# Patient Record
Sex: Male | Born: 1985 | Race: White | Hispanic: No | Marital: Married | Smoking: Never smoker
Health system: Southern US, Community
[De-identification: ages and names within clinical notes are randomized; demographics above are authoritative.]

## PROBLEM LIST (undated history)

## (undated) DIAGNOSIS — K219 Gastro-esophageal reflux disease without esophagitis: Secondary | ICD-10-CM

## (undated) HISTORY — PX: OTHER SURGICAL HISTORY: SHX169

## (undated) HISTORY — PX: PILONIDAL CYST EXCISION: SHX744

---
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
## 2005-02-04 ENCOUNTER — Emergency Department (HOSPITAL_COMMUNITY): Admission: EM | Admit: 2005-02-04 | Discharge: 2005-02-04 | Payer: Self-pay | Admitting: Emergency Medicine

## 2005-02-08 IMAGING — CR DG CHEST 2V
2 series · 2 of 2 positions shown · non-contrast
Comparison: none

CLINICAL DATA: Fever; headache; diarrhea; abdominal pain
 CHEST (TWO VIEWS), [DATE], [DZ] HOURS
 The heart size and mediastinal contours are normal. The lungs are clear. The visualized skeleton is unremarkable.

 IMPRESSION
 No active disease.

[view not recorded (1 of 2)]
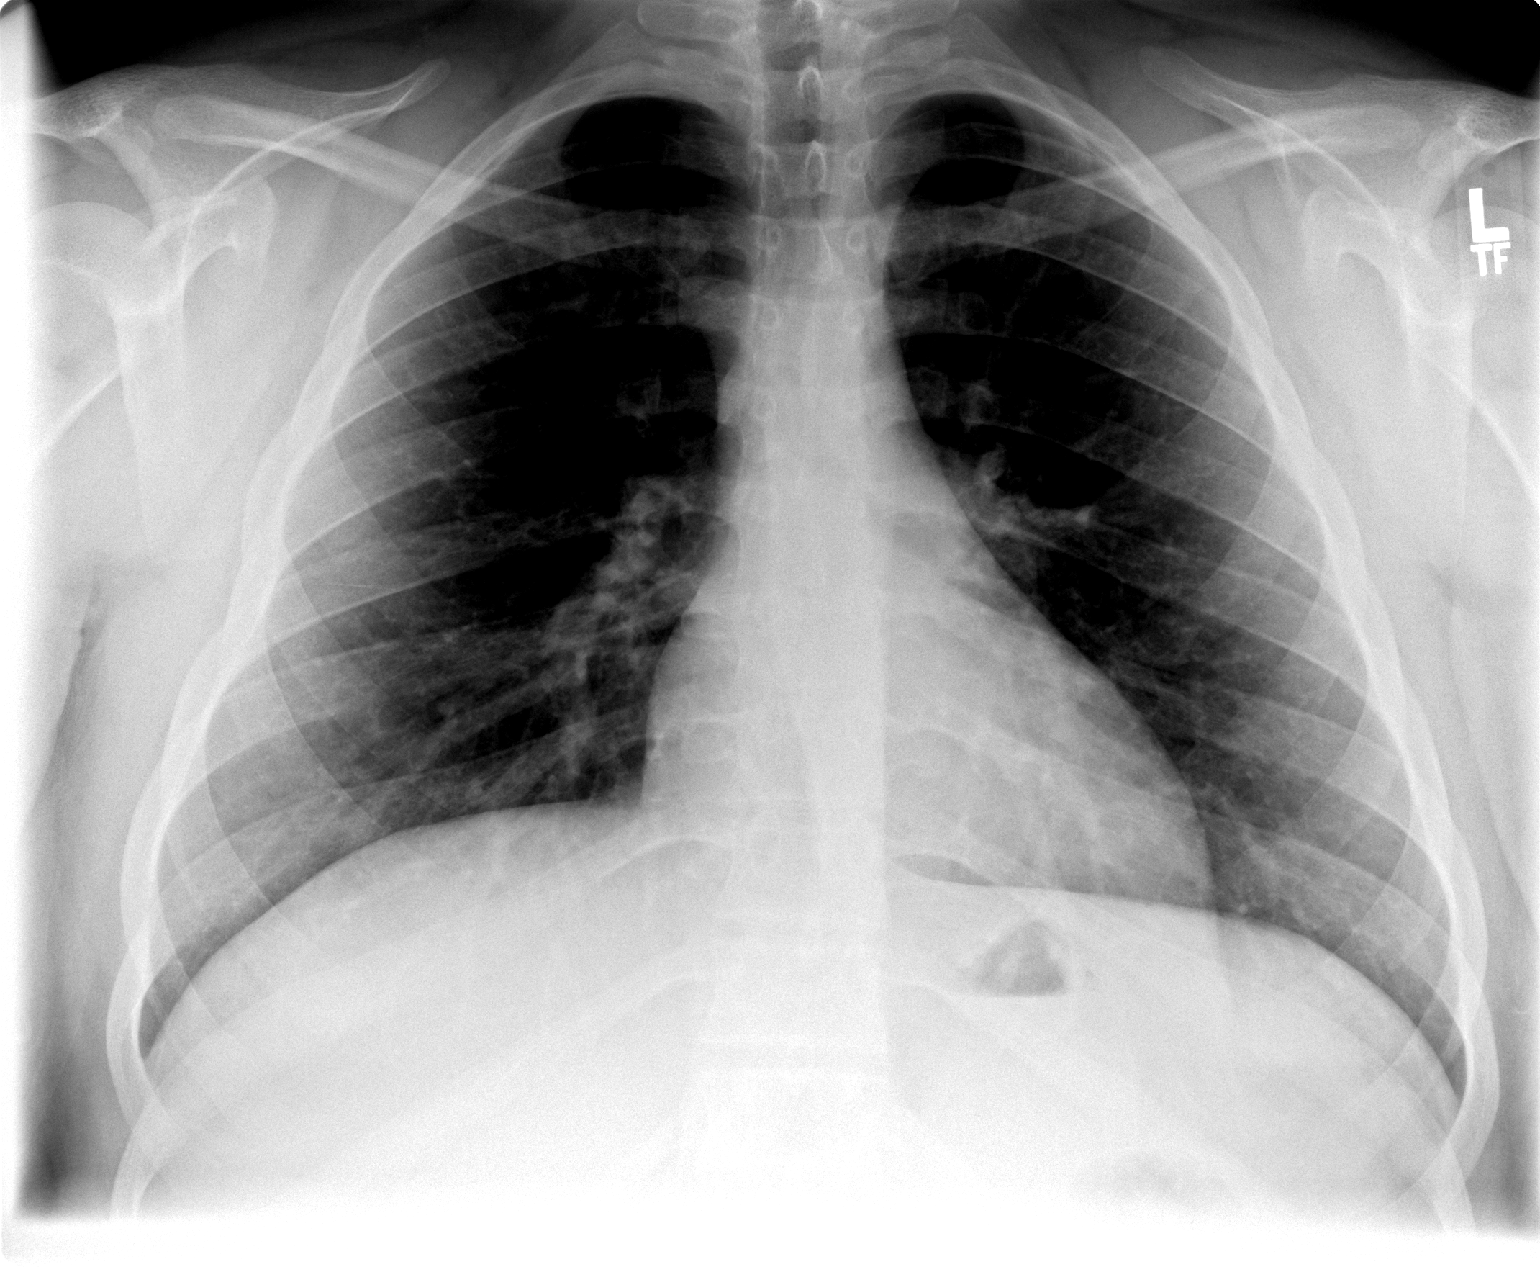

[view not recorded (2 of 2)]
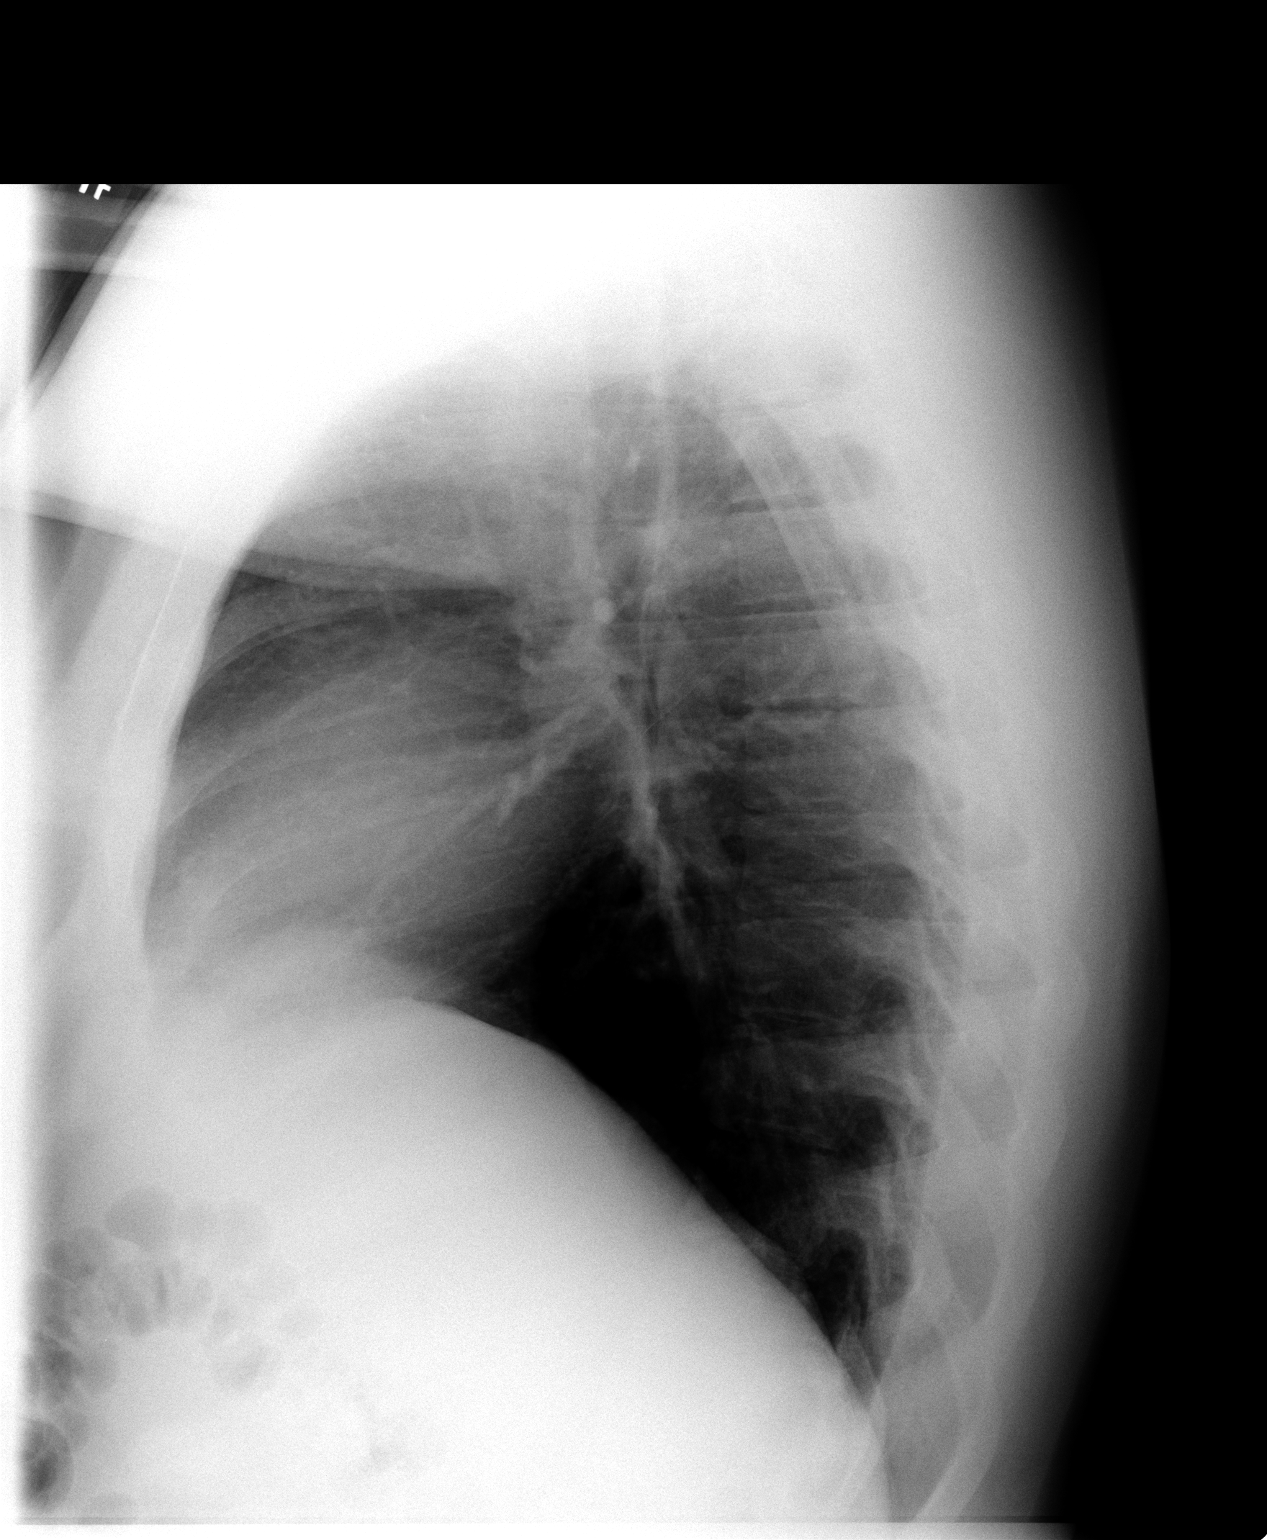

[2 of 2 positions shown; findings below may reference images not displayed]

## 2005-02-14 ENCOUNTER — Ambulatory Visit (HOSPITAL_COMMUNITY): Admission: RE | Admit: 2005-02-14 | Discharge: 2005-02-14 | Payer: Self-pay | Admitting: General Surgery

## 2005-03-01 ENCOUNTER — Emergency Department (HOSPITAL_COMMUNITY): Admission: EM | Admit: 2005-03-01 | Discharge: 2005-03-01 | Payer: Self-pay | Admitting: Emergency Medicine

## 2005-04-21 ENCOUNTER — Emergency Department (HOSPITAL_COMMUNITY): Admission: EM | Admit: 2005-04-21 | Discharge: 2005-04-22 | Payer: Self-pay | Admitting: Emergency Medicine

## 2005-04-21 ENCOUNTER — Emergency Department (HOSPITAL_COMMUNITY): Admission: EM | Admit: 2005-04-21 | Discharge: 2005-04-21 | Payer: Self-pay | Admitting: Emergency Medicine

## 2005-04-21 IMAGING — CR DG SHOULDER 2+V*R*
3 series · 3 of 3 positions shown · non-contrast
Comparison: none

CLINICAL DATA: Pain after slip and slide injury

Right shoulder three-view:
There is a curvilinear osseous density projecting at the inferior margin of the
glenoid which may represent a glenoid rim fracture. No dislocation. No
additional bone abnormality is evident.

[view not recorded (1 of 3)]
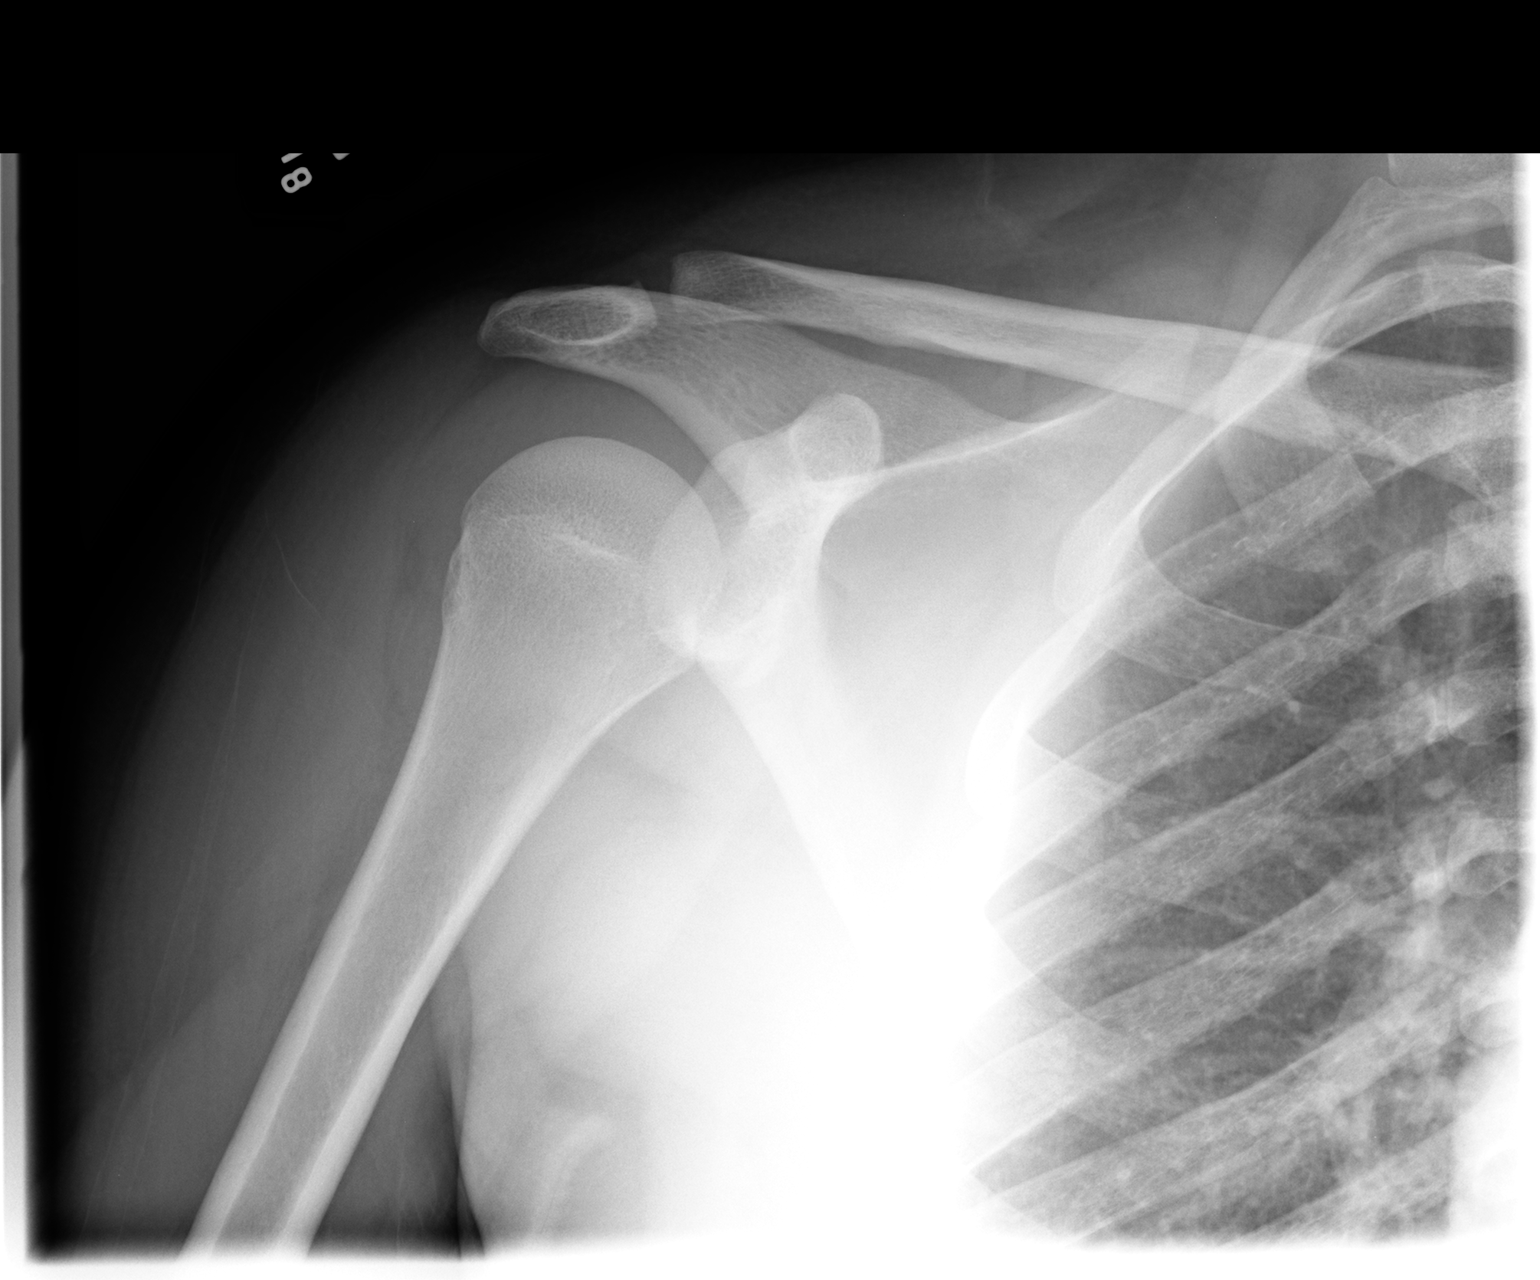

[view not recorded (2 of 3)]
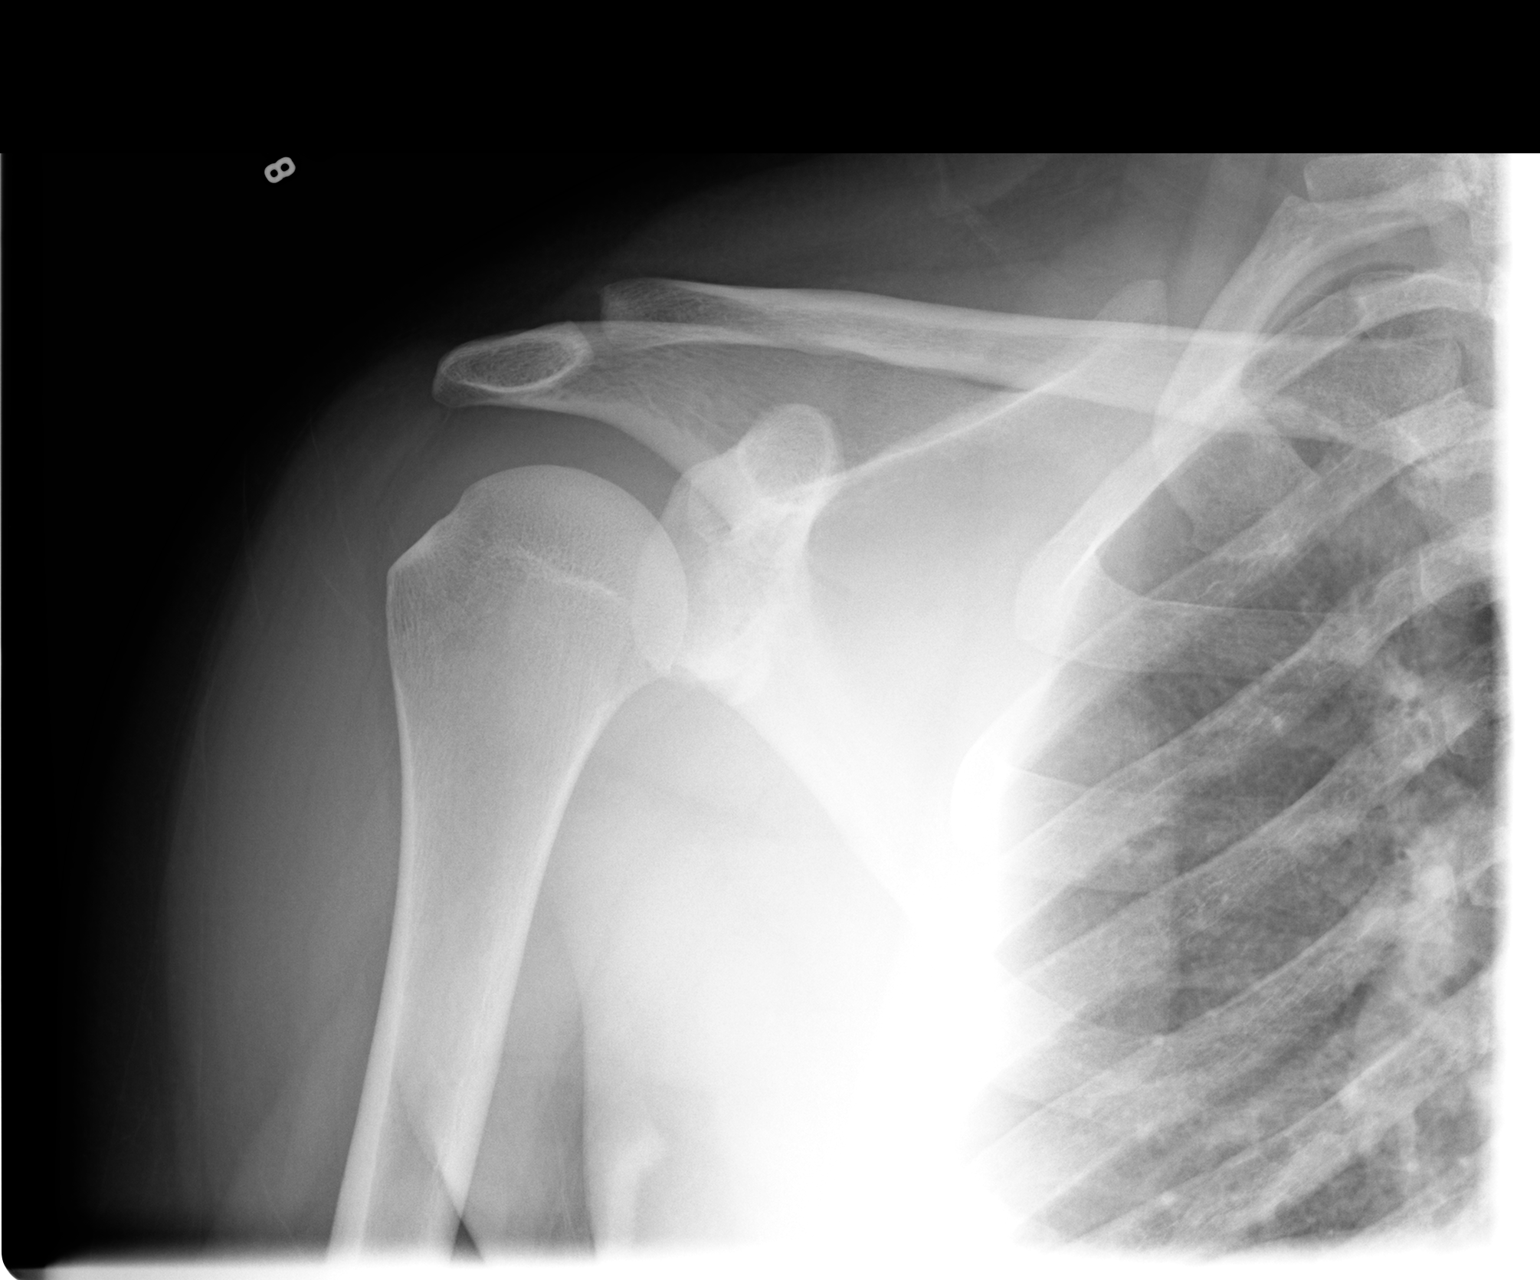

[view not recorded (3 of 3)]
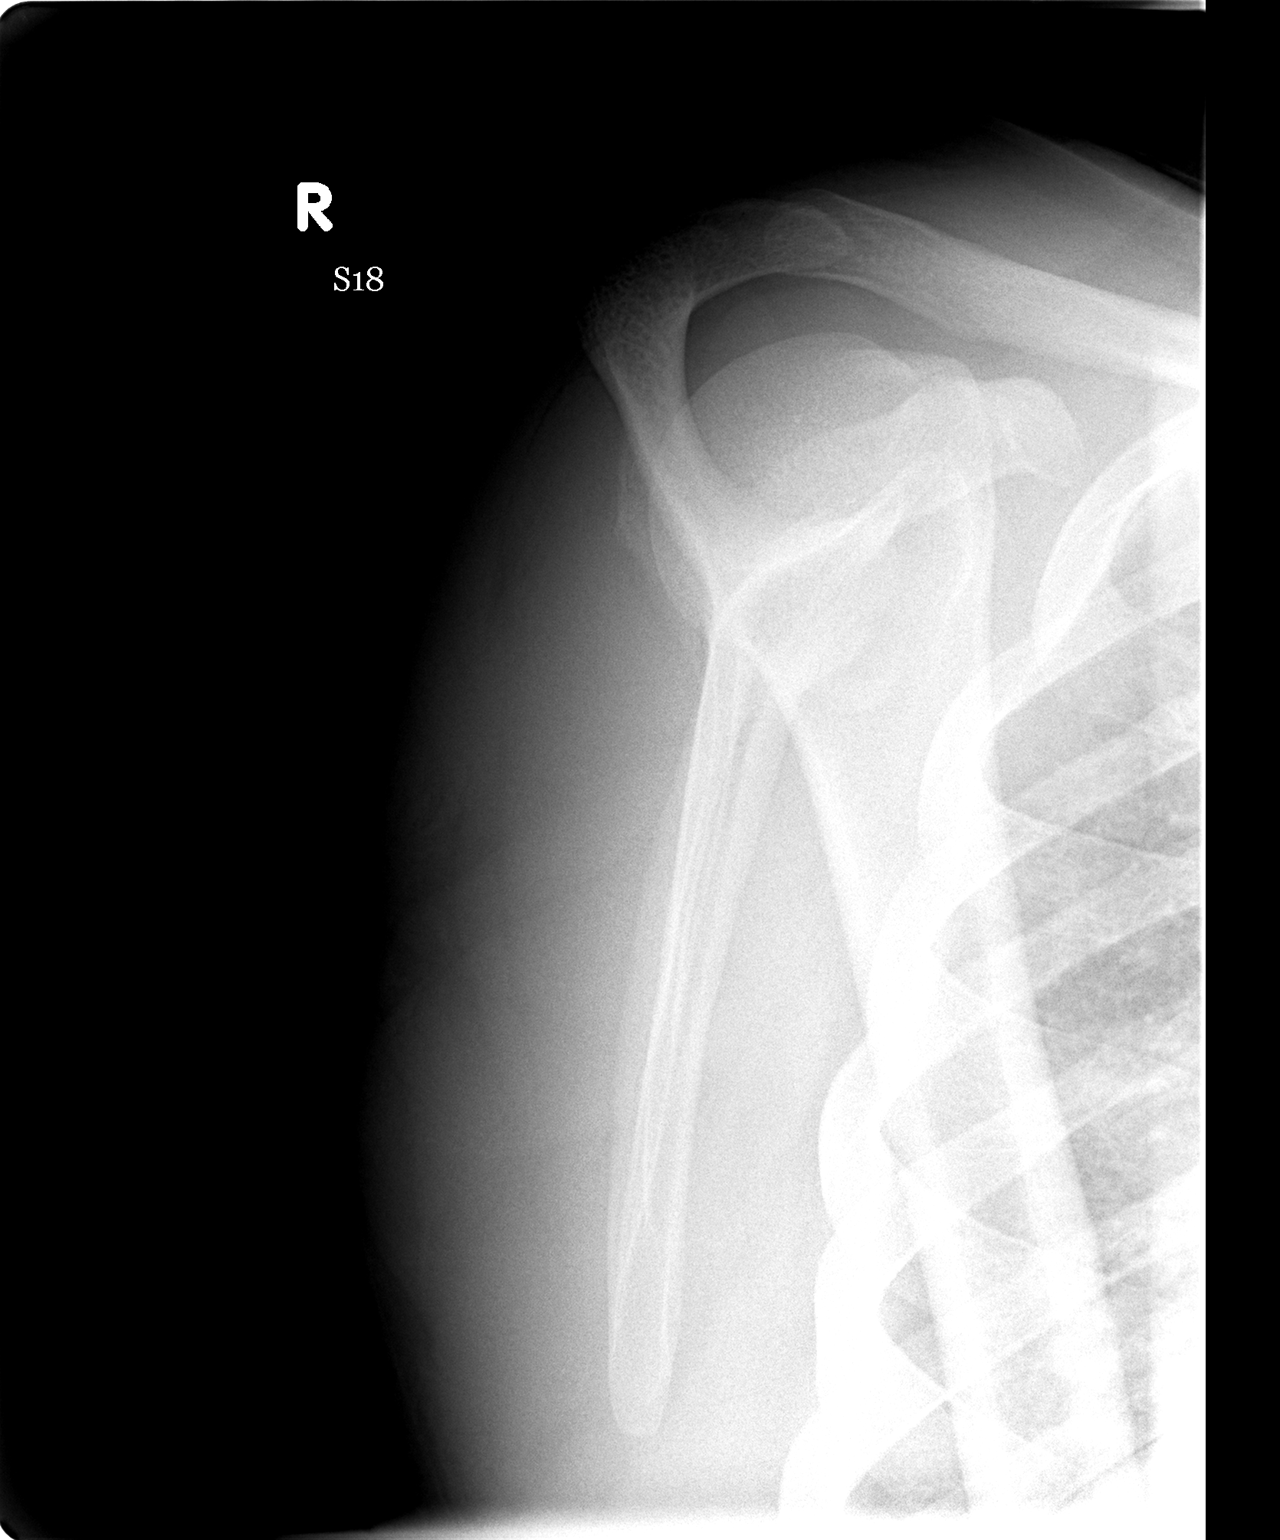

[3 of 3 positions shown; findings below may reference images not displayed]

IMPRESSION: 1. Possible fracture of the inferior margin of the glenoid fossa. Consider MR
for more complete evaluation.

## 2005-11-20 ENCOUNTER — Emergency Department (HOSPITAL_COMMUNITY): Admission: EM | Admit: 2005-11-20 | Discharge: 2005-11-20 | Payer: Self-pay | Admitting: Emergency Medicine

## 2005-11-21 ENCOUNTER — Emergency Department (HOSPITAL_COMMUNITY): Admission: EM | Admit: 2005-11-21 | Discharge: 2005-11-21 | Payer: Self-pay | Admitting: Emergency Medicine

## 2005-11-21 IMAGING — CR DG CHEST 2V
2 series · 2 of 2 positions shown · non-contrast
Comparison: [DATE].

CLINICAL DATA: Asthma and shortness of breath.  
 CHEST - 2 VIEW:

[view not recorded (1 of 2)]
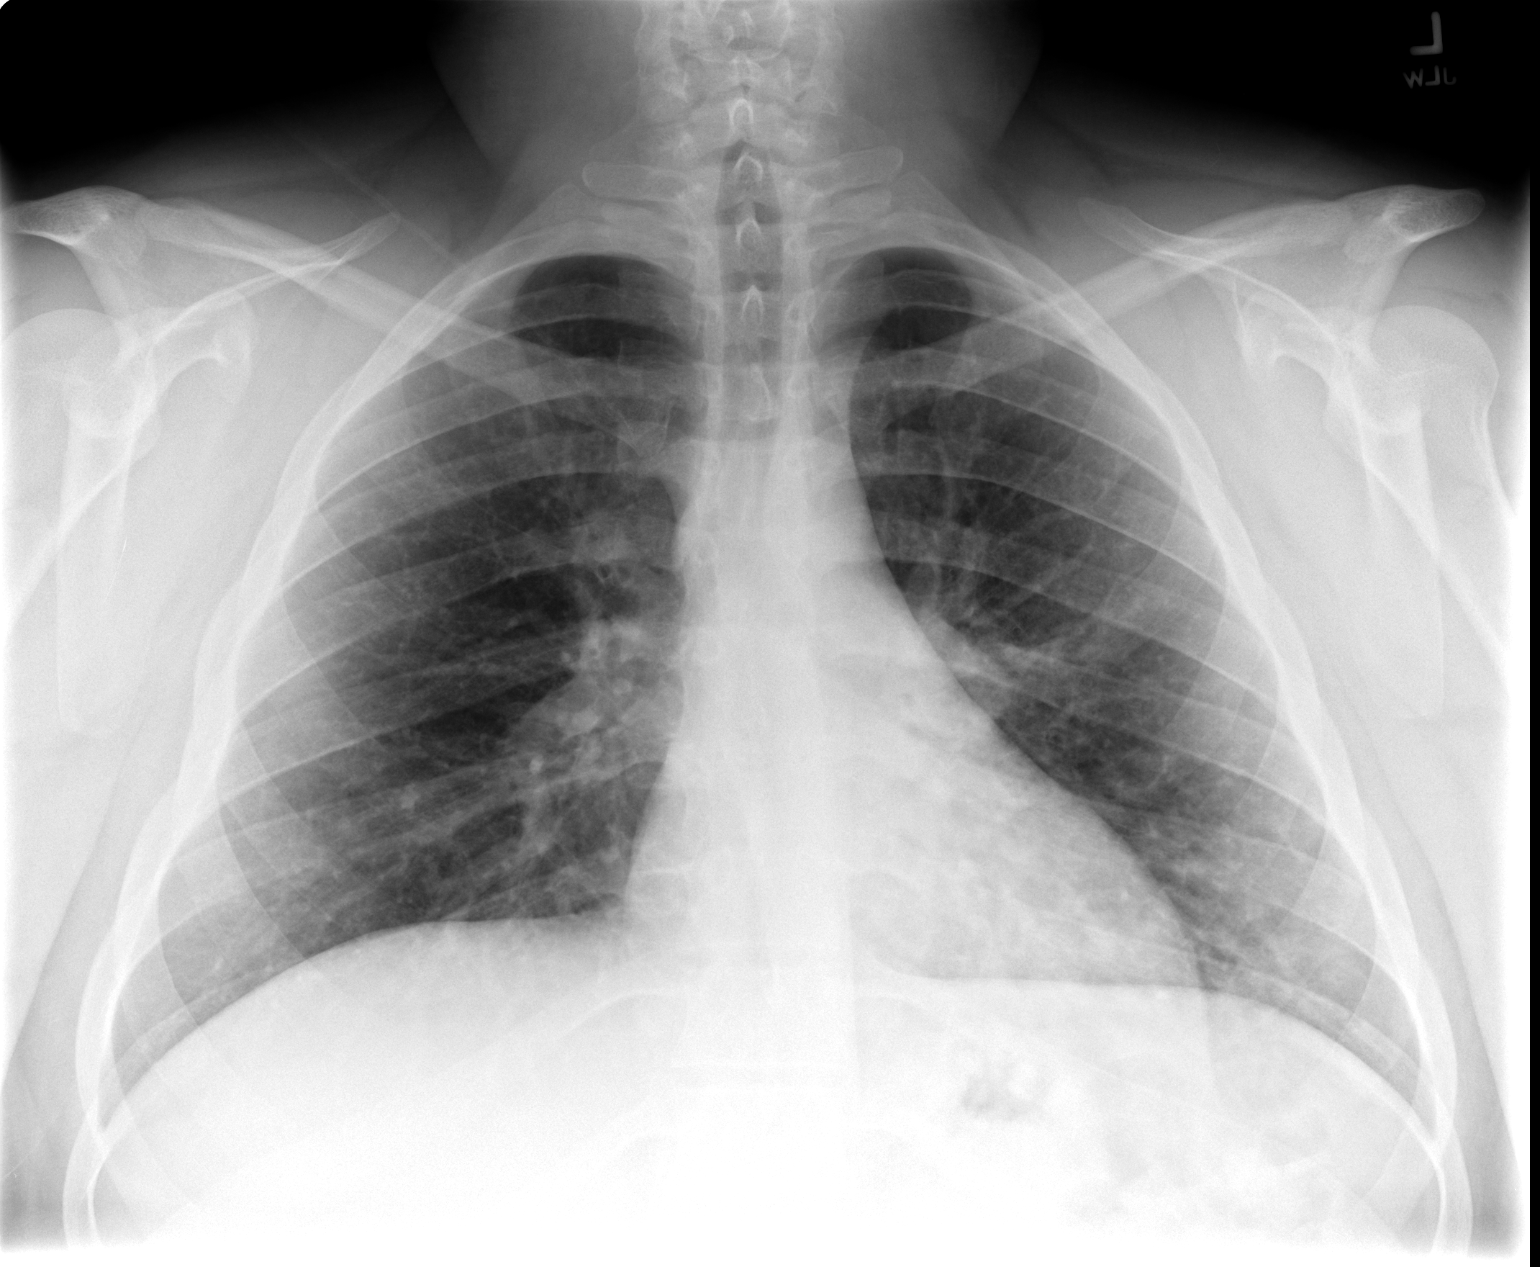

[view not recorded (2 of 2)]
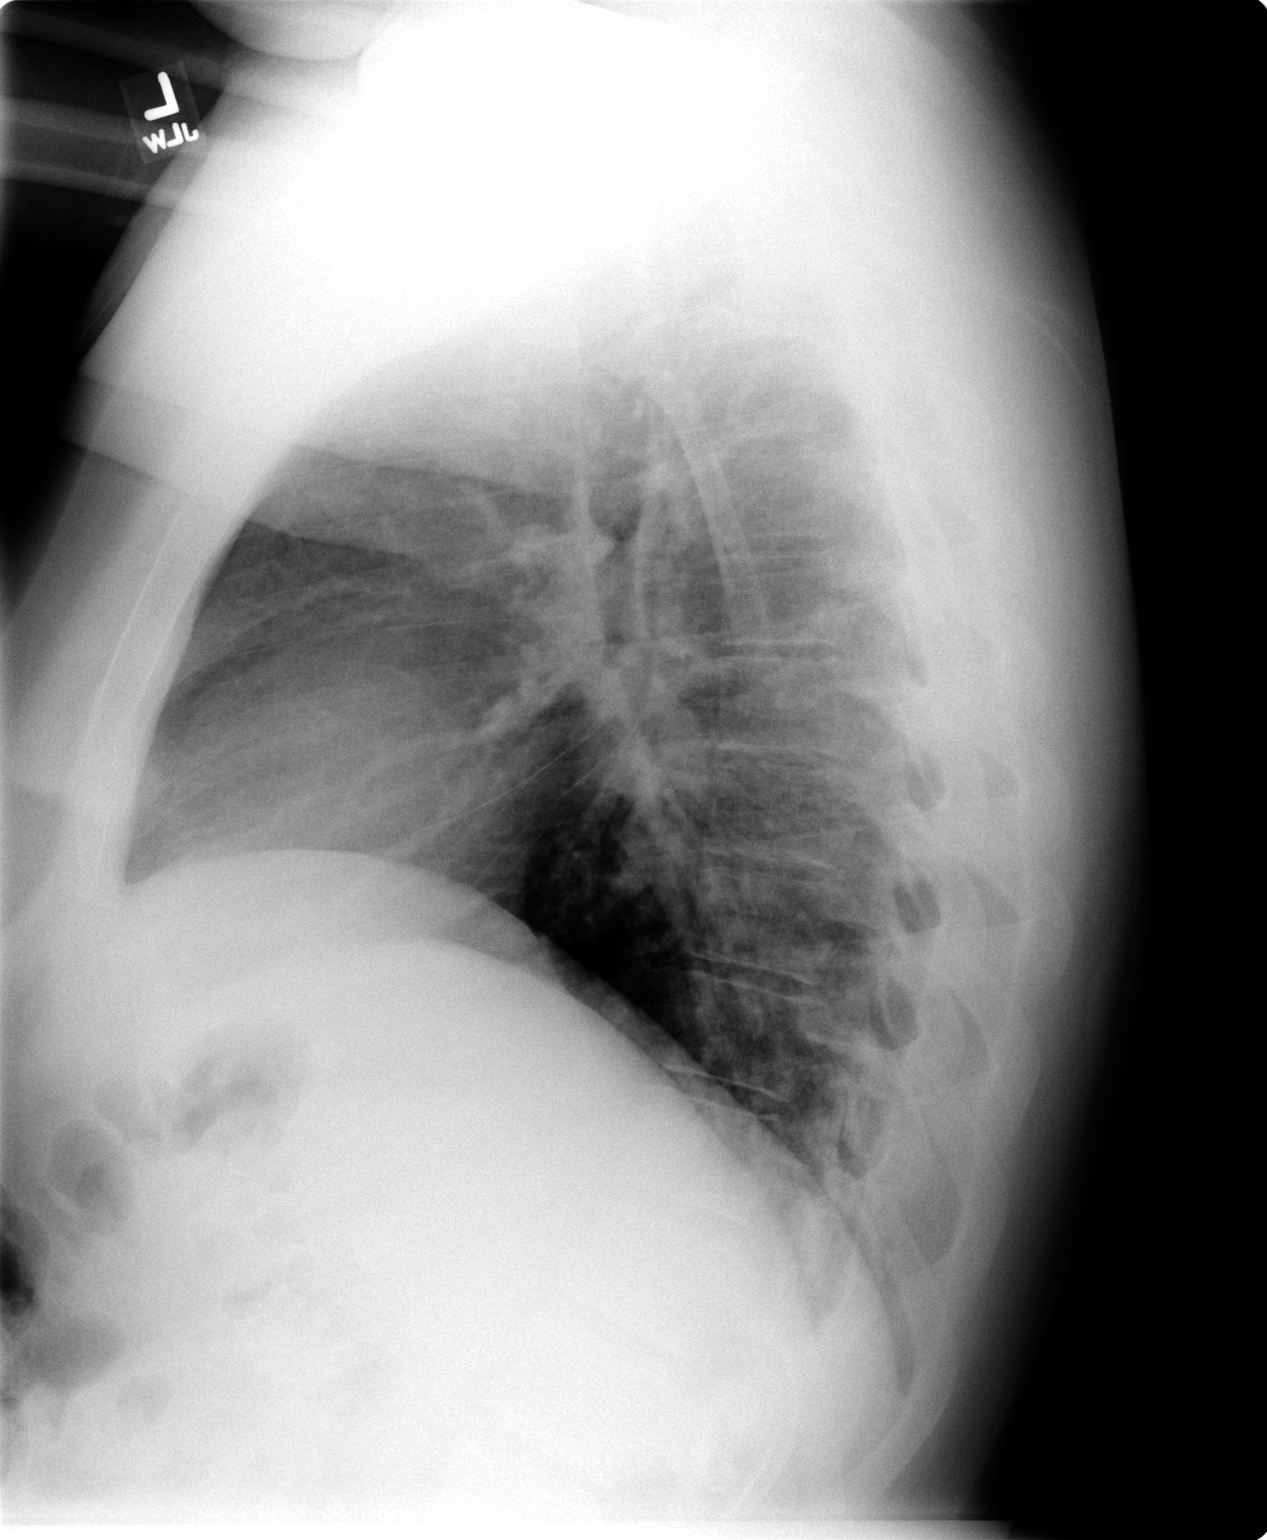

[2 of 2 positions shown; findings below may reference images not displayed]

FINDINGS: Midline trachea. The heart is at the upper limits of normal for size.  The mediastinal contours are unremarkable.  The right lung is clear.  There is a subtle vague area of increased density over the left lung base.  While this could be due to overlying soft tissues, its asymmetry suggests a subtle air space opacity.  No pleural effusion.
IMPRESSION: Vague increased density of the left lung base on the frontal view.  Although this could be due to overlying soft tissues, an early pneumonia cannot be excluded.  Consider antibiotics and radiographic follow-up.

## 2006-01-16 ENCOUNTER — Emergency Department (HOSPITAL_COMMUNITY): Admission: EM | Admit: 2006-01-16 | Discharge: 2006-01-16 | Payer: Self-pay | Admitting: Emergency Medicine

## 2006-03-10 ENCOUNTER — Emergency Department (HOSPITAL_COMMUNITY): Admission: EM | Admit: 2006-03-10 | Discharge: 2006-03-10 | Payer: Self-pay | Admitting: Emergency Medicine

## 2006-03-10 IMAGING — US US SCROTUM
1 series · 14 of 25 positions shown · non-contrast
Comparison: None.

CLINICAL DATA: Small, superficial, palpable mass at the inferior aspect of the
scrotum on the left.

SCROTAL ULTRASOUND
TECHNIQUE: Complete ultrasound examination of the testicles, epididymis, and
other scrotal structures was performed.
TECHNIQUE: Color and duplex Doppler ultrasound was utilized to evaluate blood
flow to the testicles.

[Series 1: unknown · 0.09mm/px · 14 of 35 slices shown]
[im 1/35]
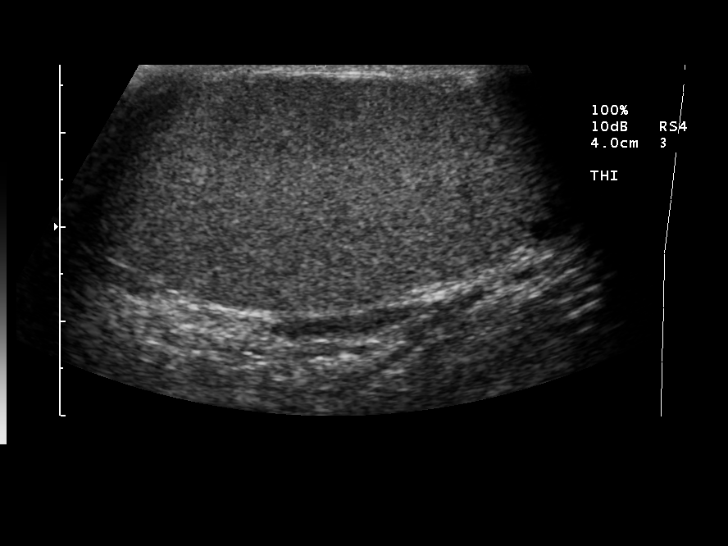
[im 3/35]
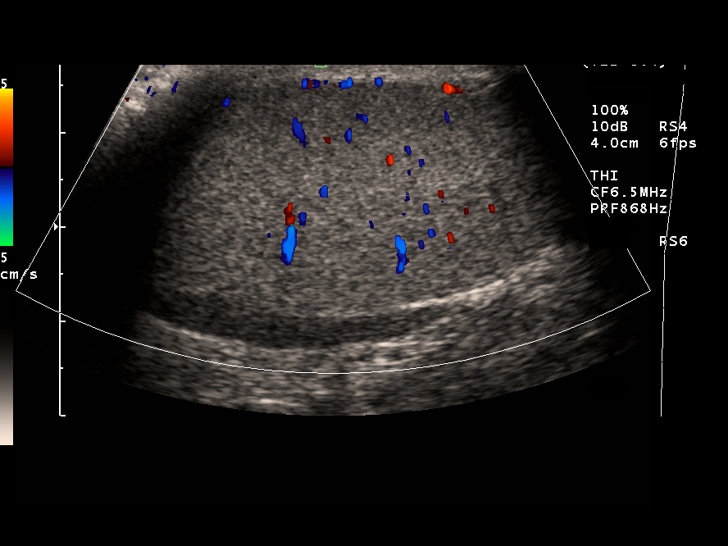
[im 6/35]
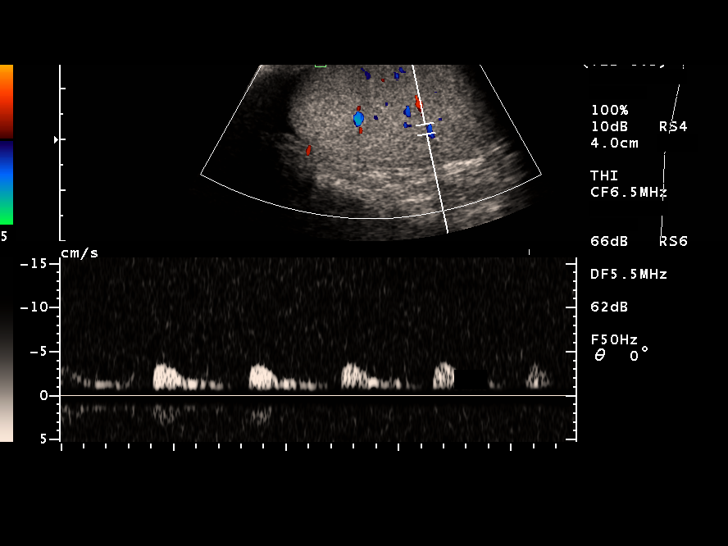
[im 9/35]
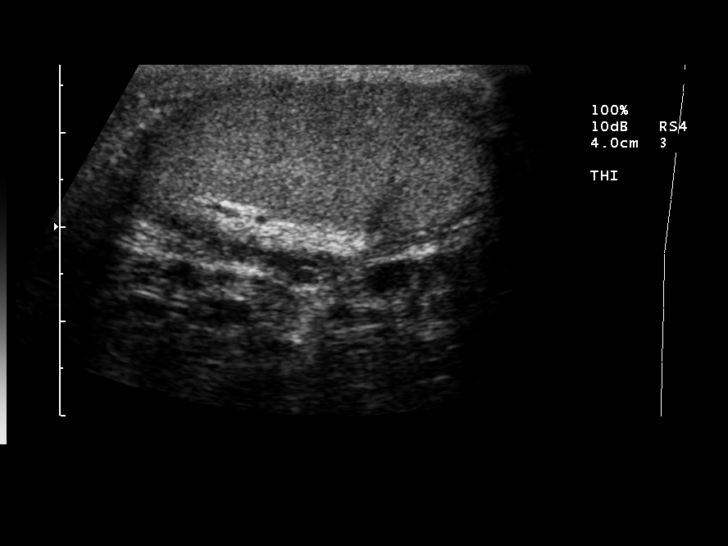
[im 12/35]
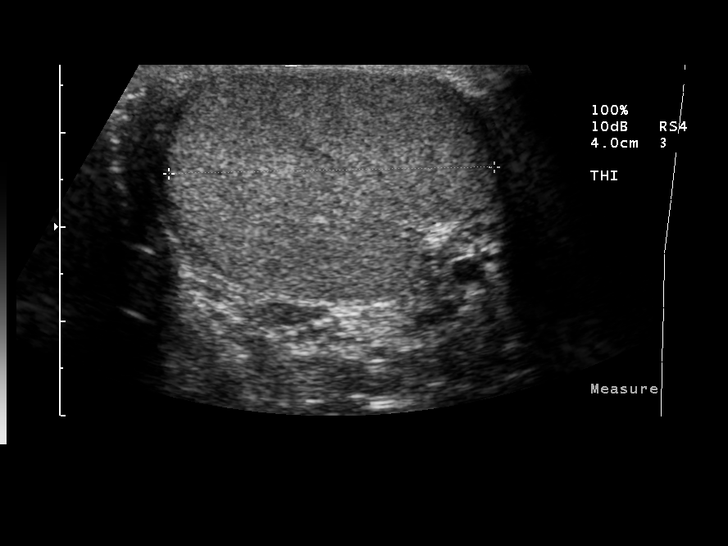
[im 13/35]
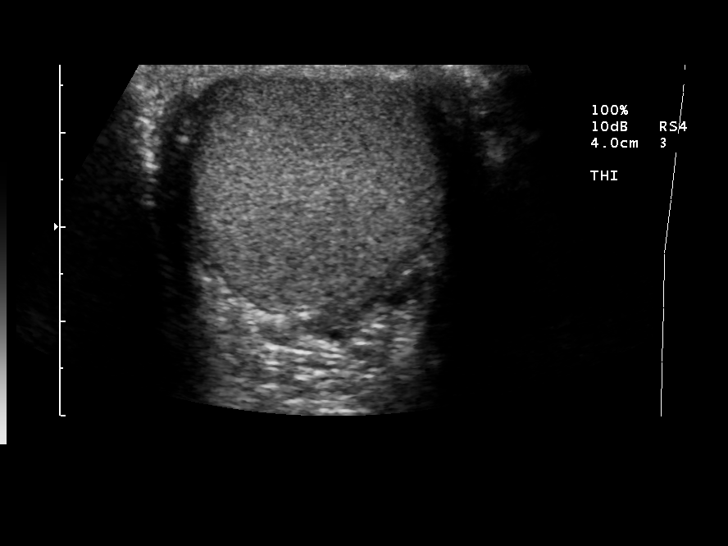
[im 16/35]
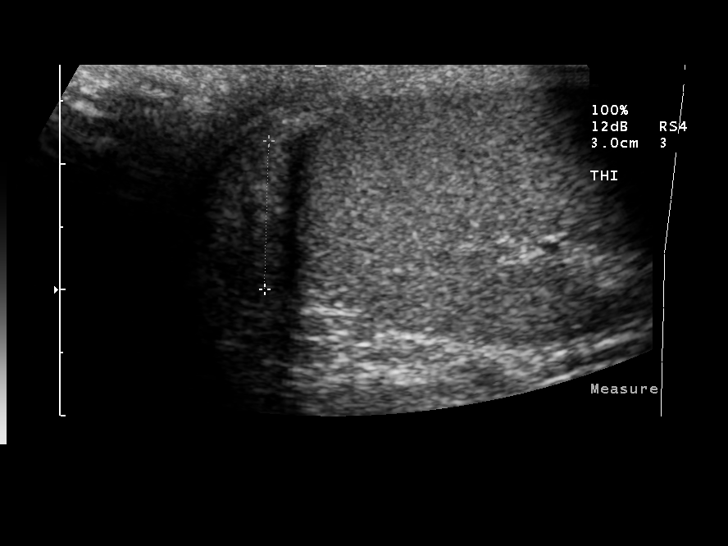
[im 19/35]
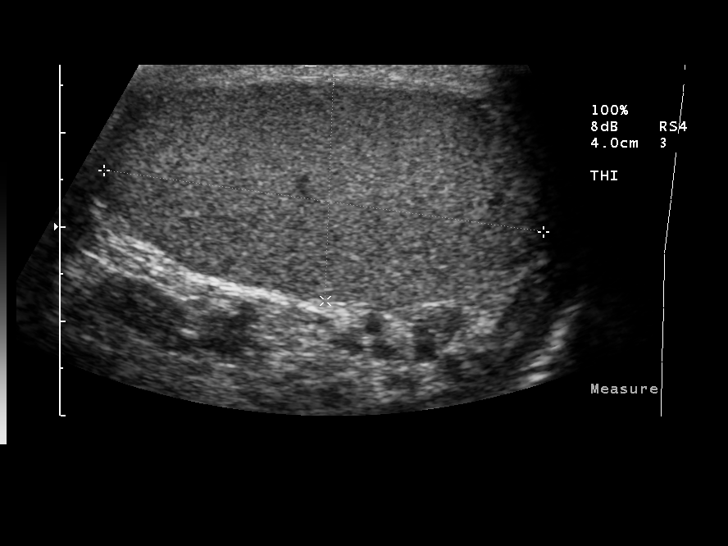
[im 22/35]
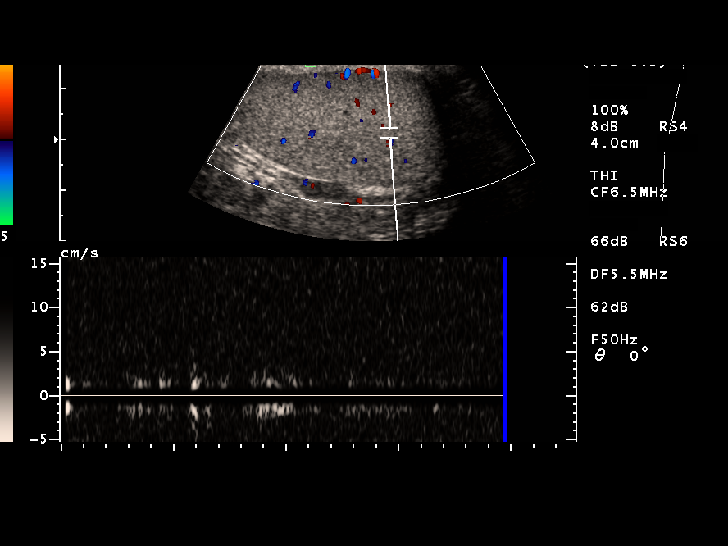
[im 23/35]
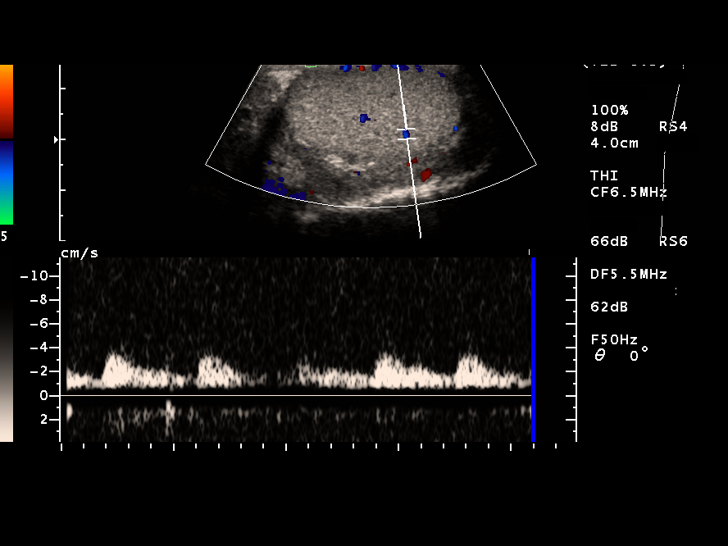
[im 26/35]
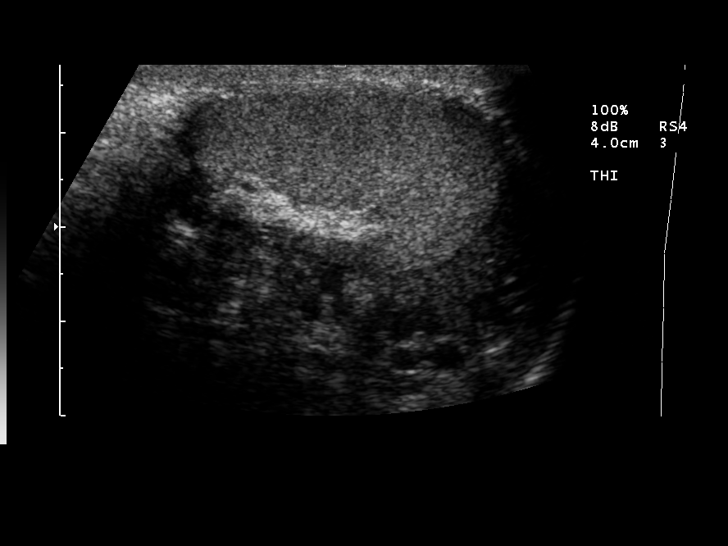
[im 29/35]
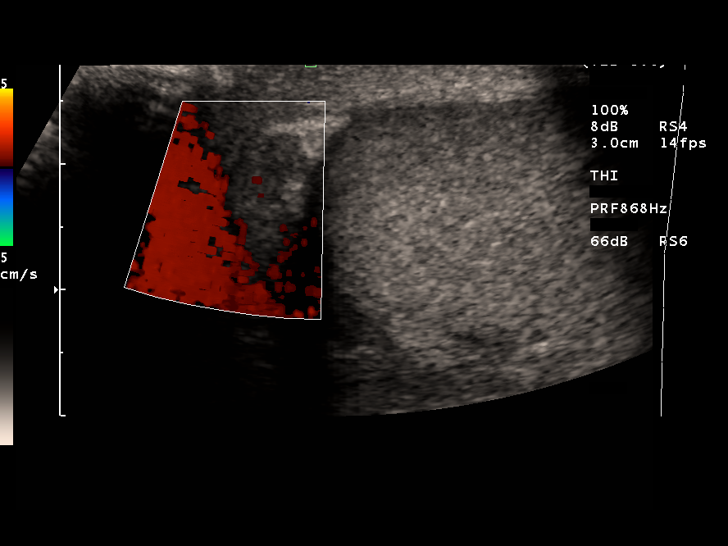
[im 32/35]
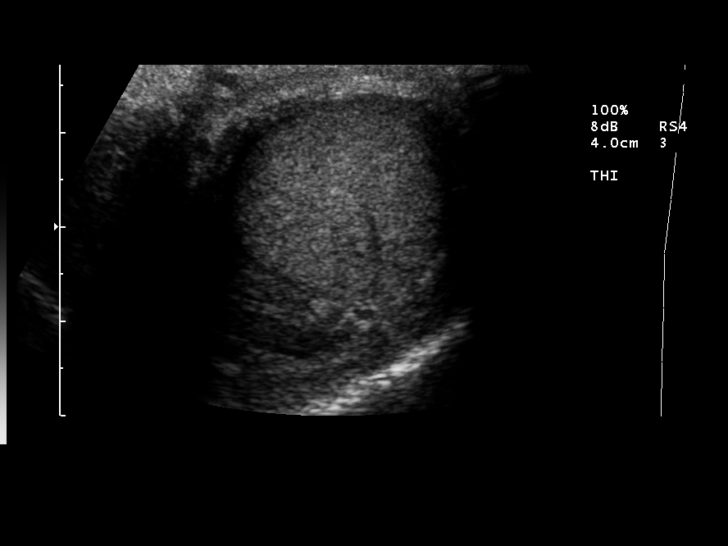
[im 35/35]
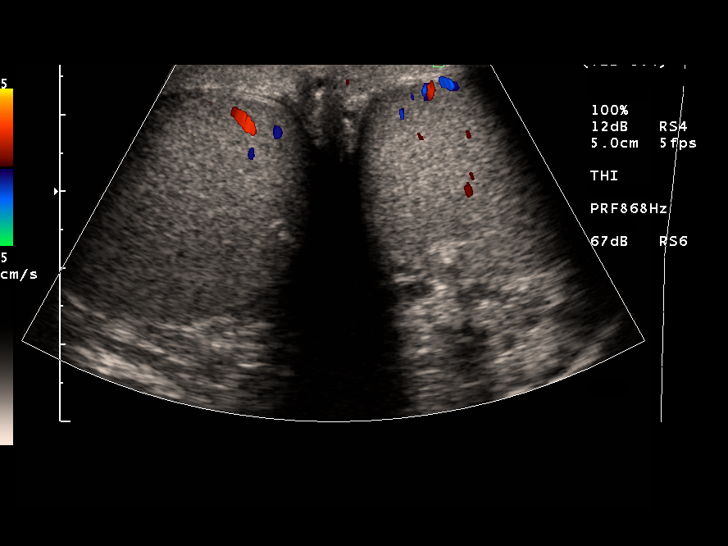

[14 of 25 positions shown; findings below may reference images not displayed]

FINDINGS: No abnormality visible in the skin or elsewhere at the location of the
palpable mass. Normal appearing testicle and epididymis on each side. No
varicocele or hydrocele seen.

IMPRESSION

Normal examination.

DOPPLER ULTRASOUND OF THE TESTICLES
FINDINGS: Normal internal blood flow within each testicle and epididymis with
color-flow and duplex Doppler.

IMPRESSION

Normal examination.

## 2006-03-23 ENCOUNTER — Emergency Department (HOSPITAL_COMMUNITY): Admission: EM | Admit: 2006-03-23 | Discharge: 2006-03-23 | Payer: Self-pay | Admitting: Emergency Medicine

## 2006-03-24 ENCOUNTER — Emergency Department (HOSPITAL_COMMUNITY): Admission: EM | Admit: 2006-03-24 | Discharge: 2006-03-24 | Payer: Self-pay | Admitting: Emergency Medicine

## 2006-06-06 ENCOUNTER — Emergency Department (HOSPITAL_COMMUNITY): Admission: EM | Admit: 2006-06-06 | Discharge: 2006-06-06 | Payer: Self-pay | Admitting: Emergency Medicine

## 2006-06-09 ENCOUNTER — Emergency Department (HOSPITAL_COMMUNITY): Admission: EM | Admit: 2006-06-09 | Discharge: 2006-06-09 | Payer: Self-pay | Admitting: Emergency Medicine

## 2006-06-09 IMAGING — CR DG ABDOMEN ACUTE W/ 1V CHEST
3 series · 3 of 3 positions shown · non-contrast
Comparison: Chest x-ray [DATE]

CLINICAL DATA: Constipation.

[view not recorded (1 of 3)]
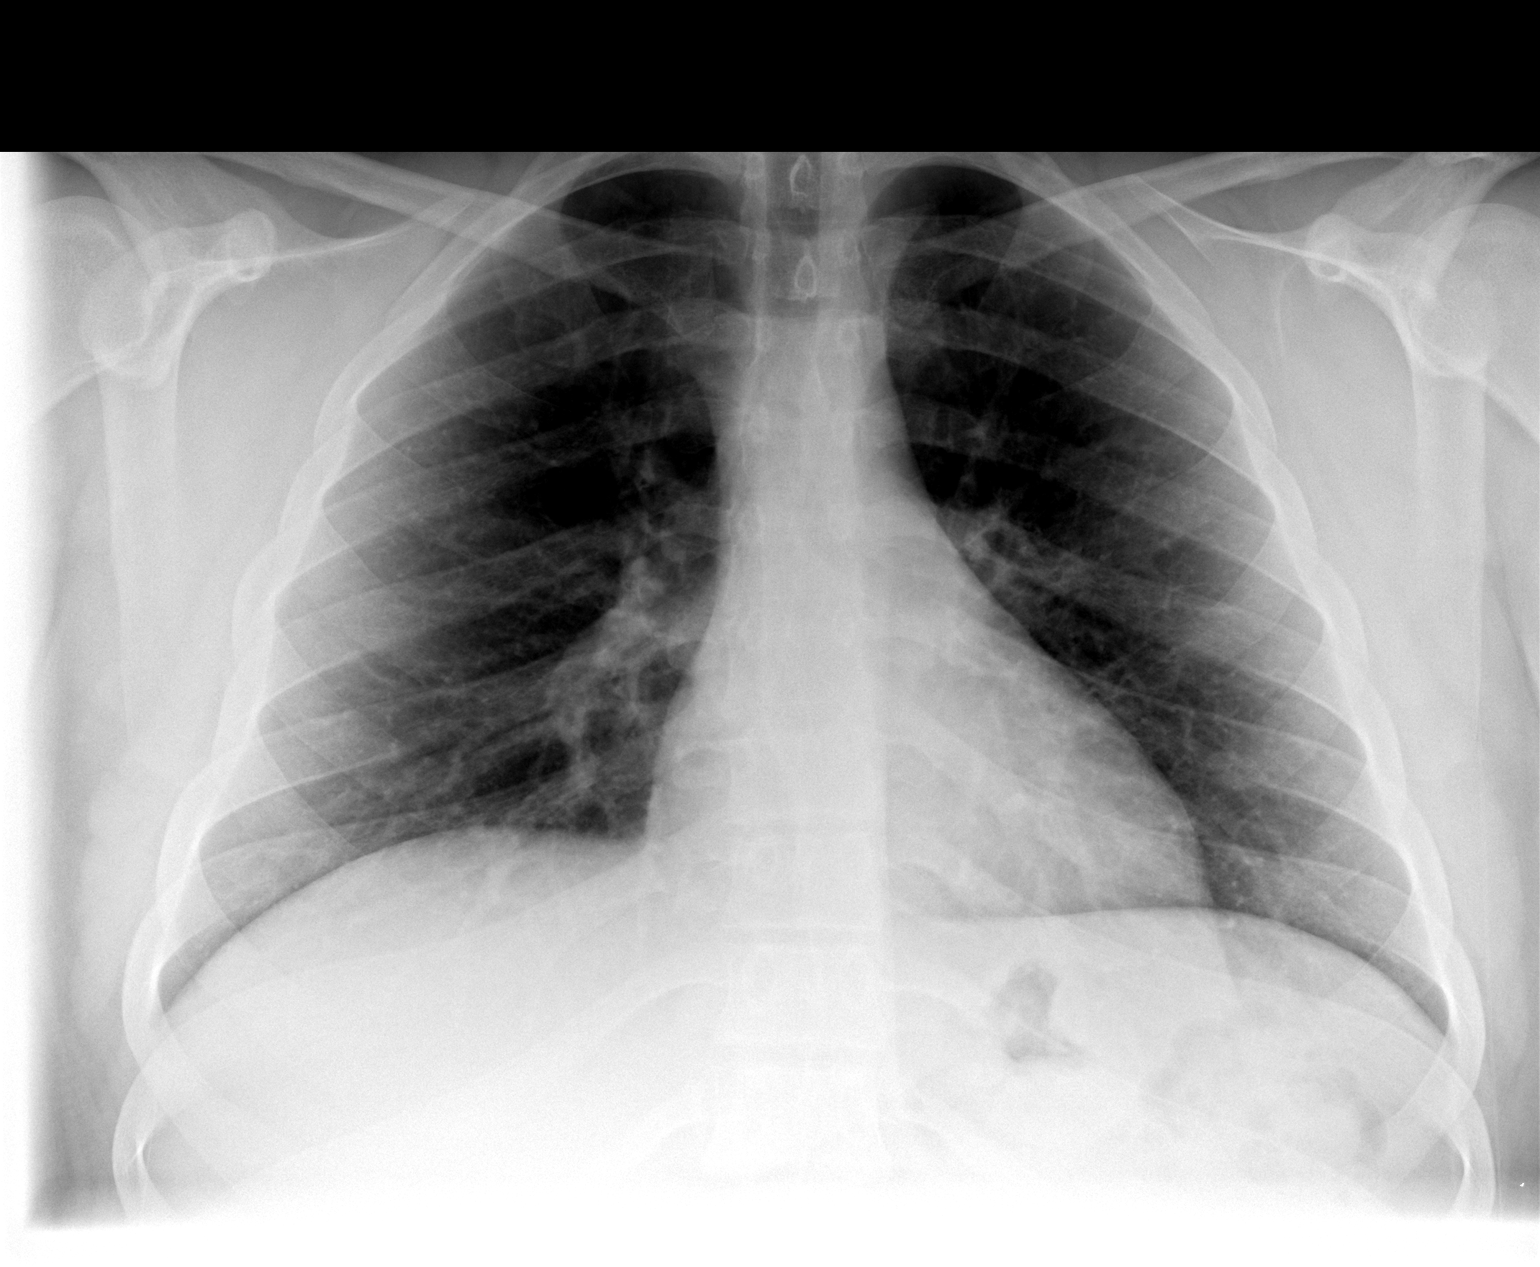

[view not recorded (2 of 3)]
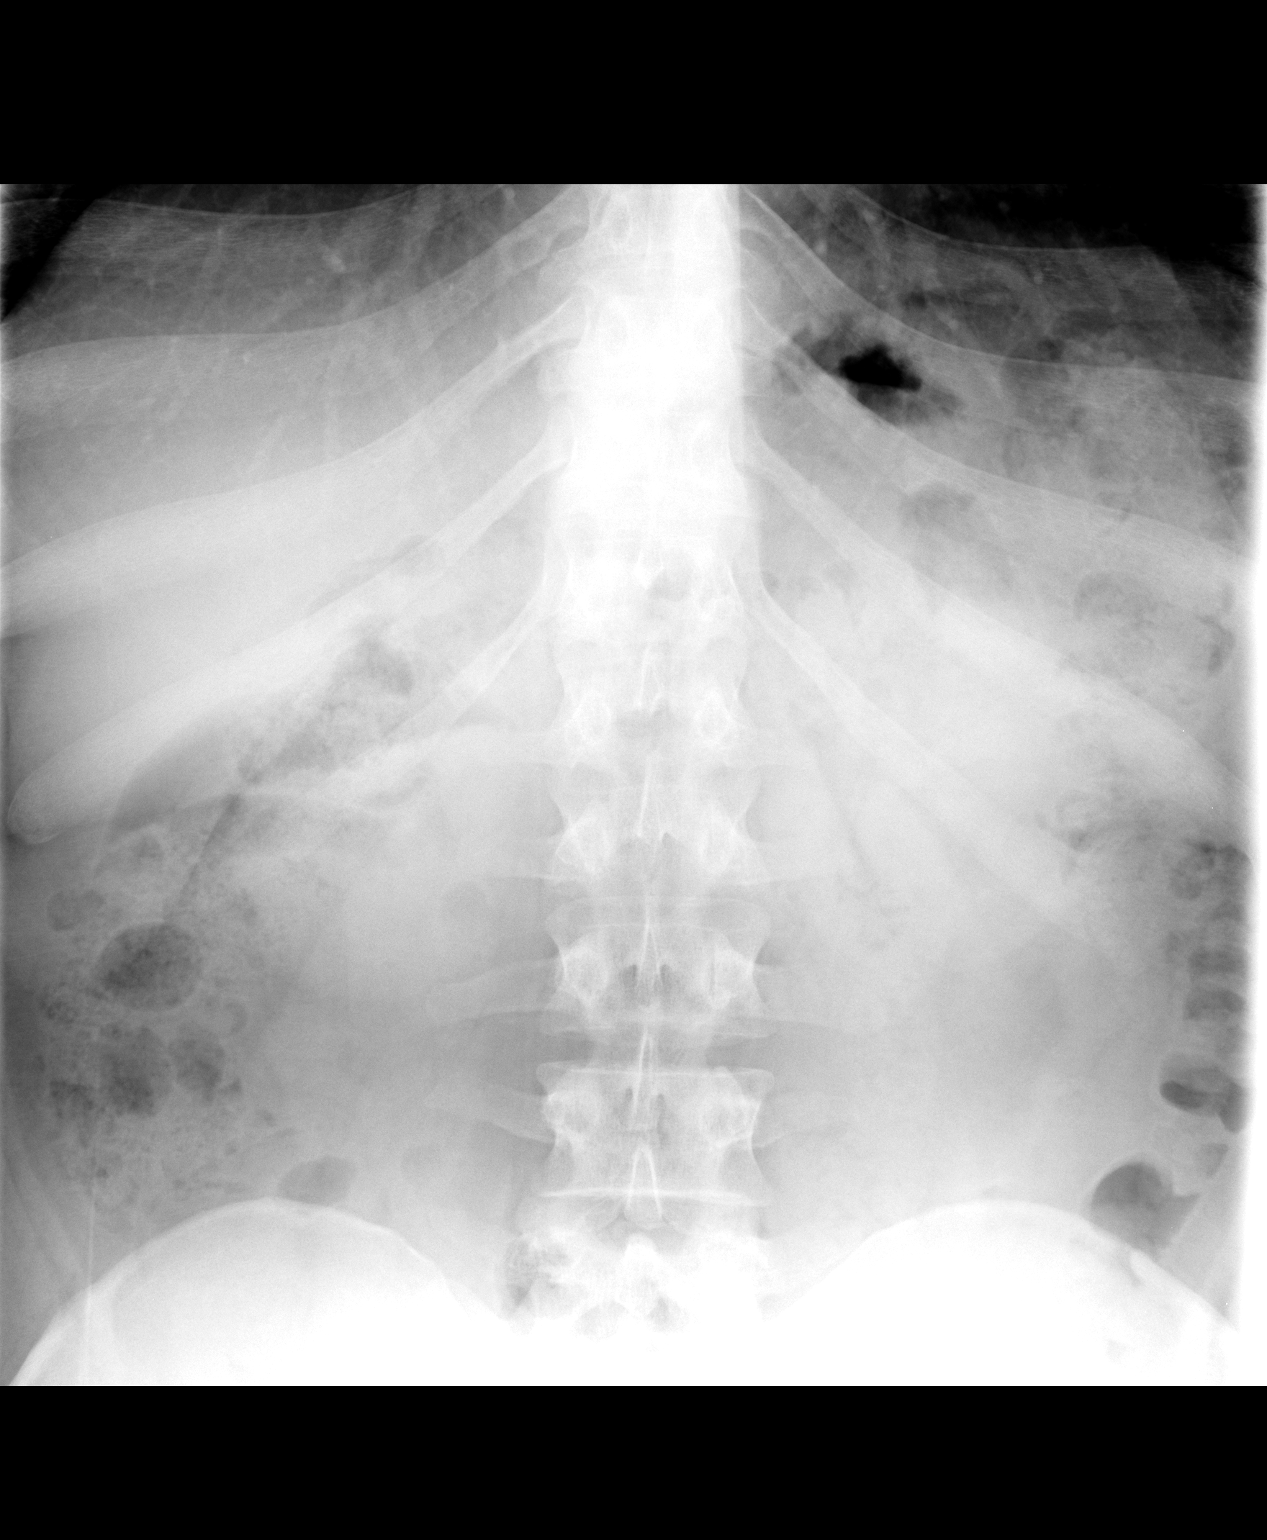

[view not recorded (3 of 3)]
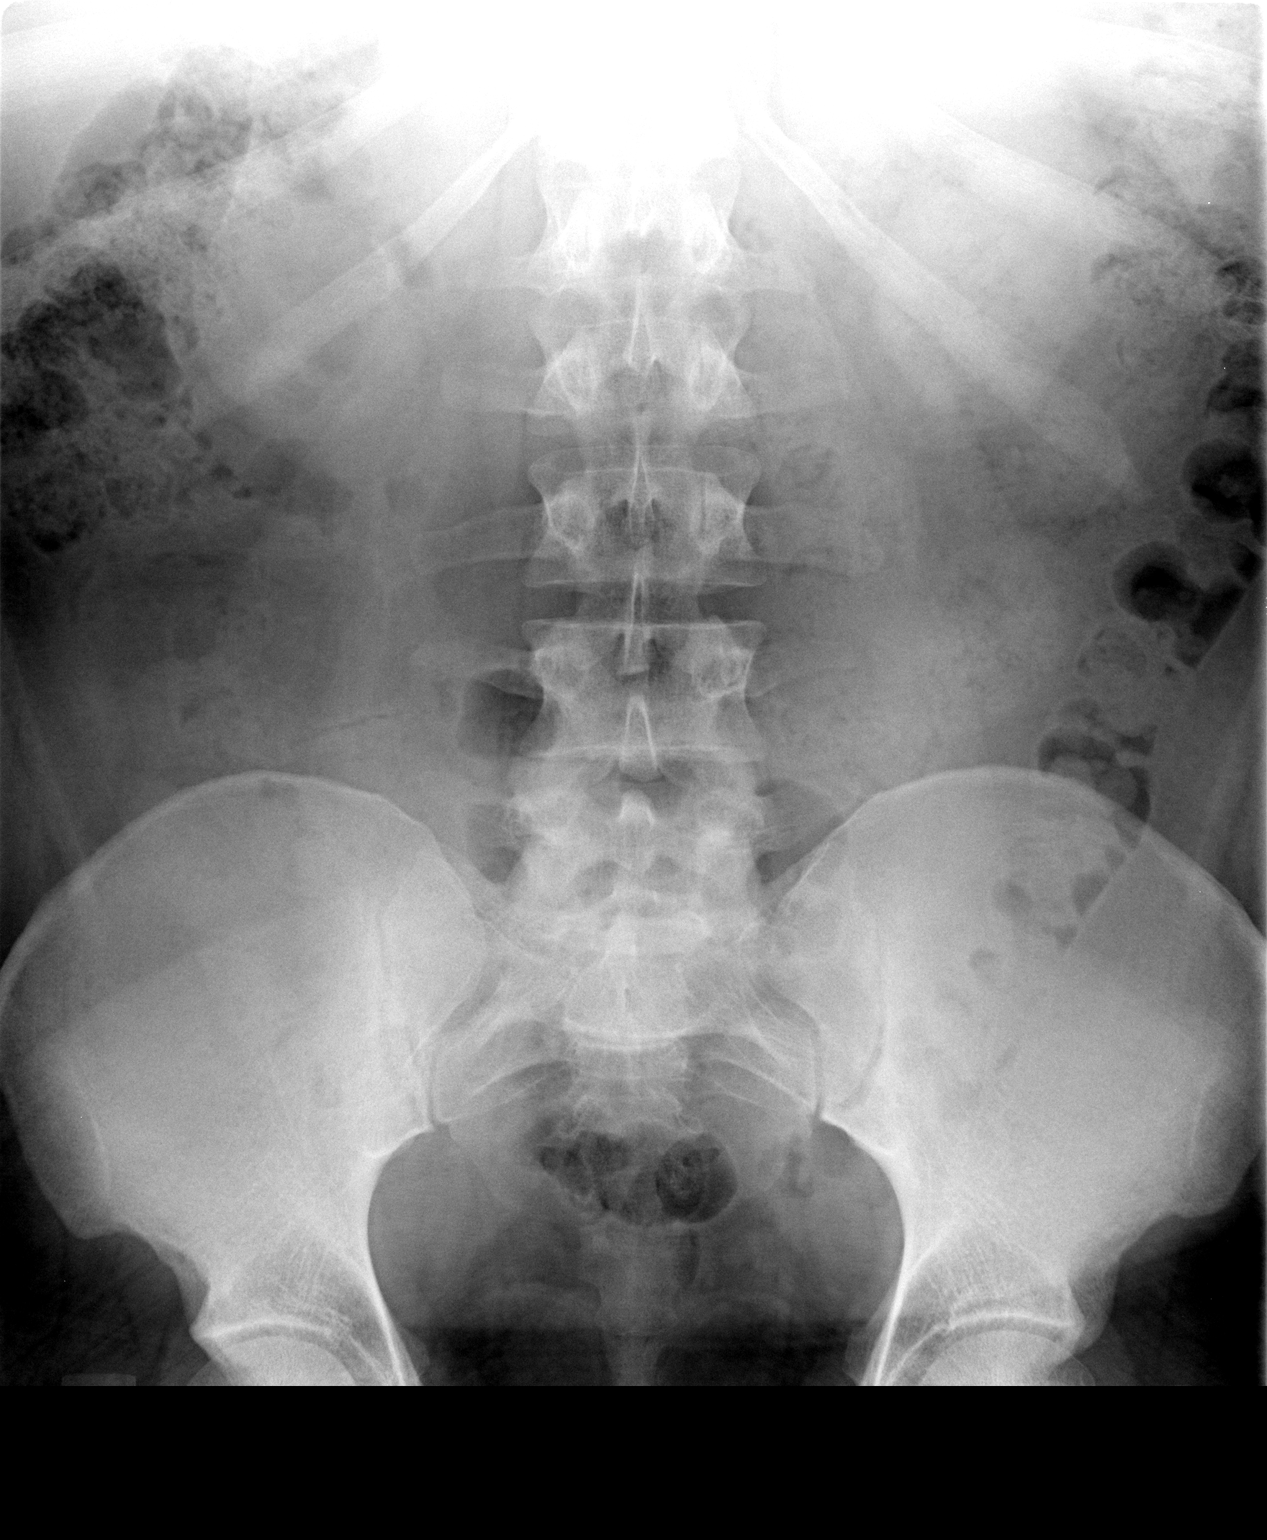

[3 of 3 positions shown; findings below may reference images not displayed]

ABDOMEN SERIES - 2 VIEW & CHEST - 1 VIEW:

Upright chest shows clear lungs bilaterally.  The cardiopericardial silhouette
is within normal limits for size.

No evidence for intraperitoneal free air or bowel obstruction. Prominent stool
distributed along the course of the colon raises the question of constipation.
IMPRESSION: No acute cardiopulmonary process.

Nonspecific bowel gas pattern.

Question constipation.

## 2006-11-04 ENCOUNTER — Emergency Department (HOSPITAL_COMMUNITY): Admission: EM | Admit: 2006-11-04 | Discharge: 2006-11-04 | Payer: Self-pay | Admitting: Emergency Medicine

## 2006-11-20 ENCOUNTER — Emergency Department (HOSPITAL_COMMUNITY): Admission: EM | Admit: 2006-11-20 | Discharge: 2006-11-20 | Payer: Self-pay | Admitting: Emergency Medicine

## 2006-12-17 ENCOUNTER — Emergency Department (HOSPITAL_COMMUNITY): Admission: EM | Admit: 2006-12-17 | Discharge: 2006-12-17 | Payer: Self-pay | Admitting: Emergency Medicine

## 2007-02-05 IMAGING — CR DG HAND COMPLETE 3+V*R*
3 series · 3 of 3 positions shown · non-contrast
Comparison: none

CLINICAL DATA: Hit wall three days ago.
 RIGHT HAND ? 3 VIEWS:

[view not recorded (1 of 3)]
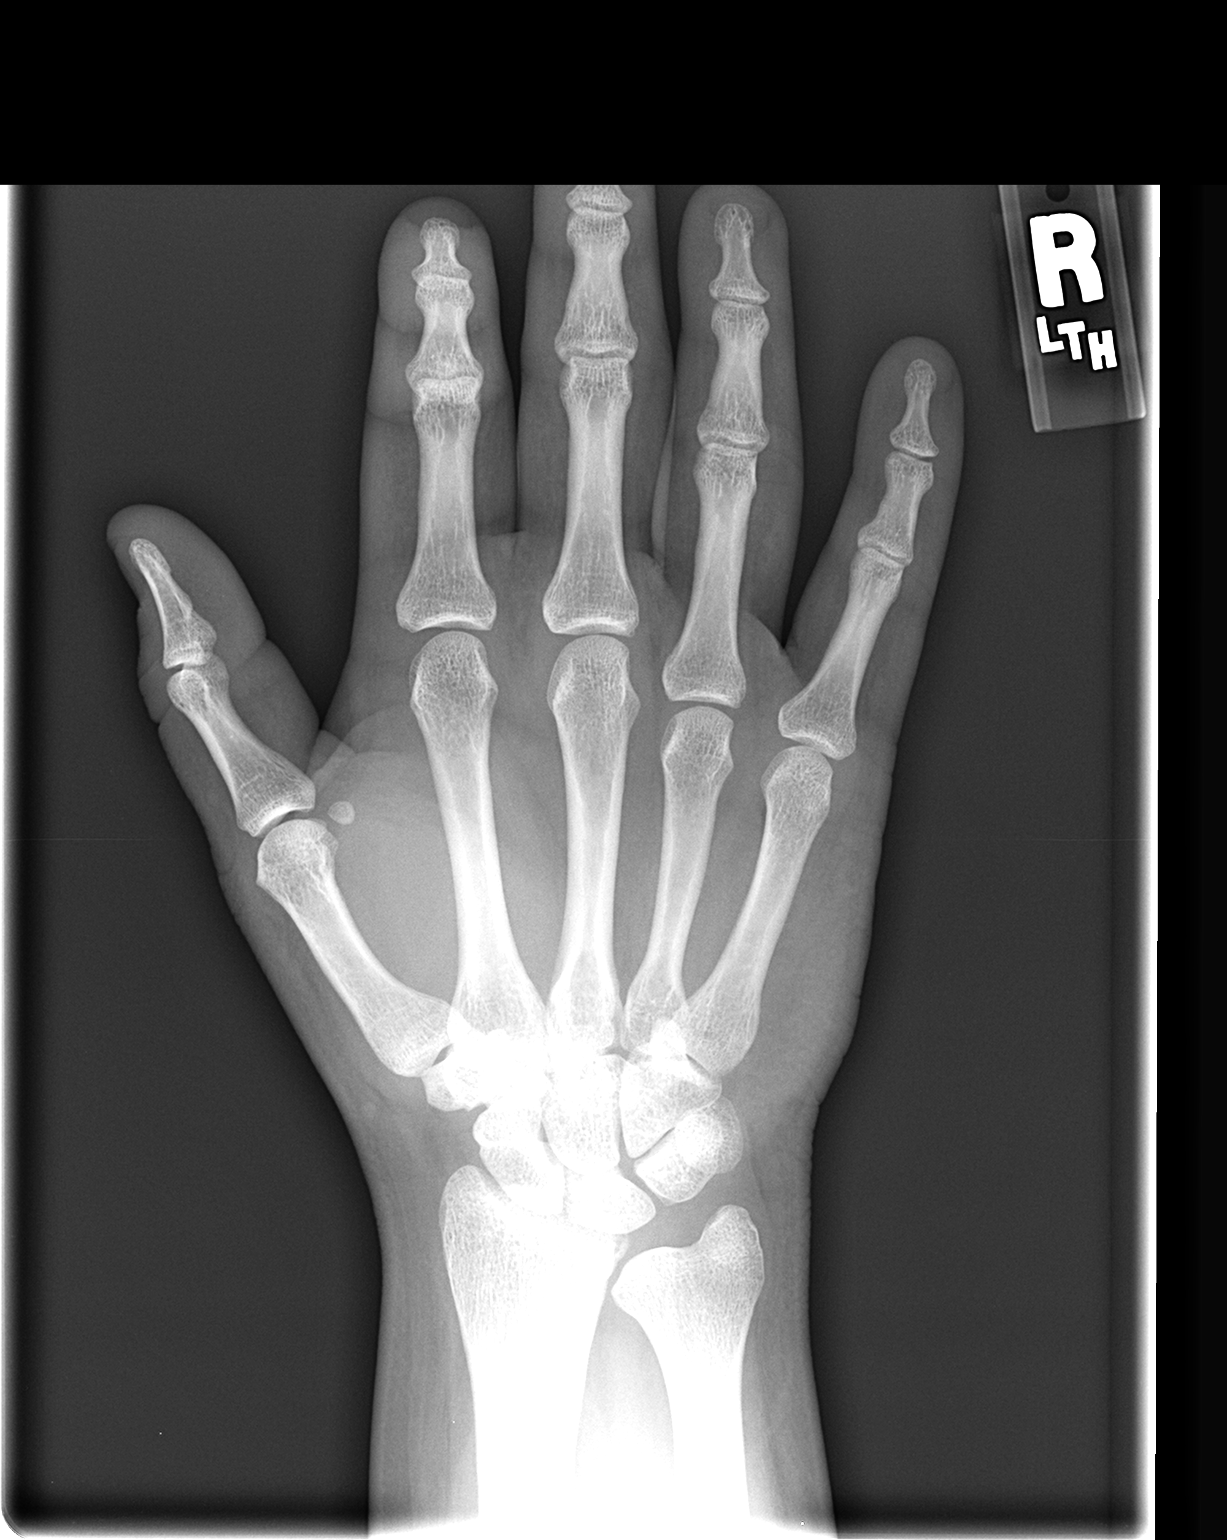

[view not recorded (2 of 3)]
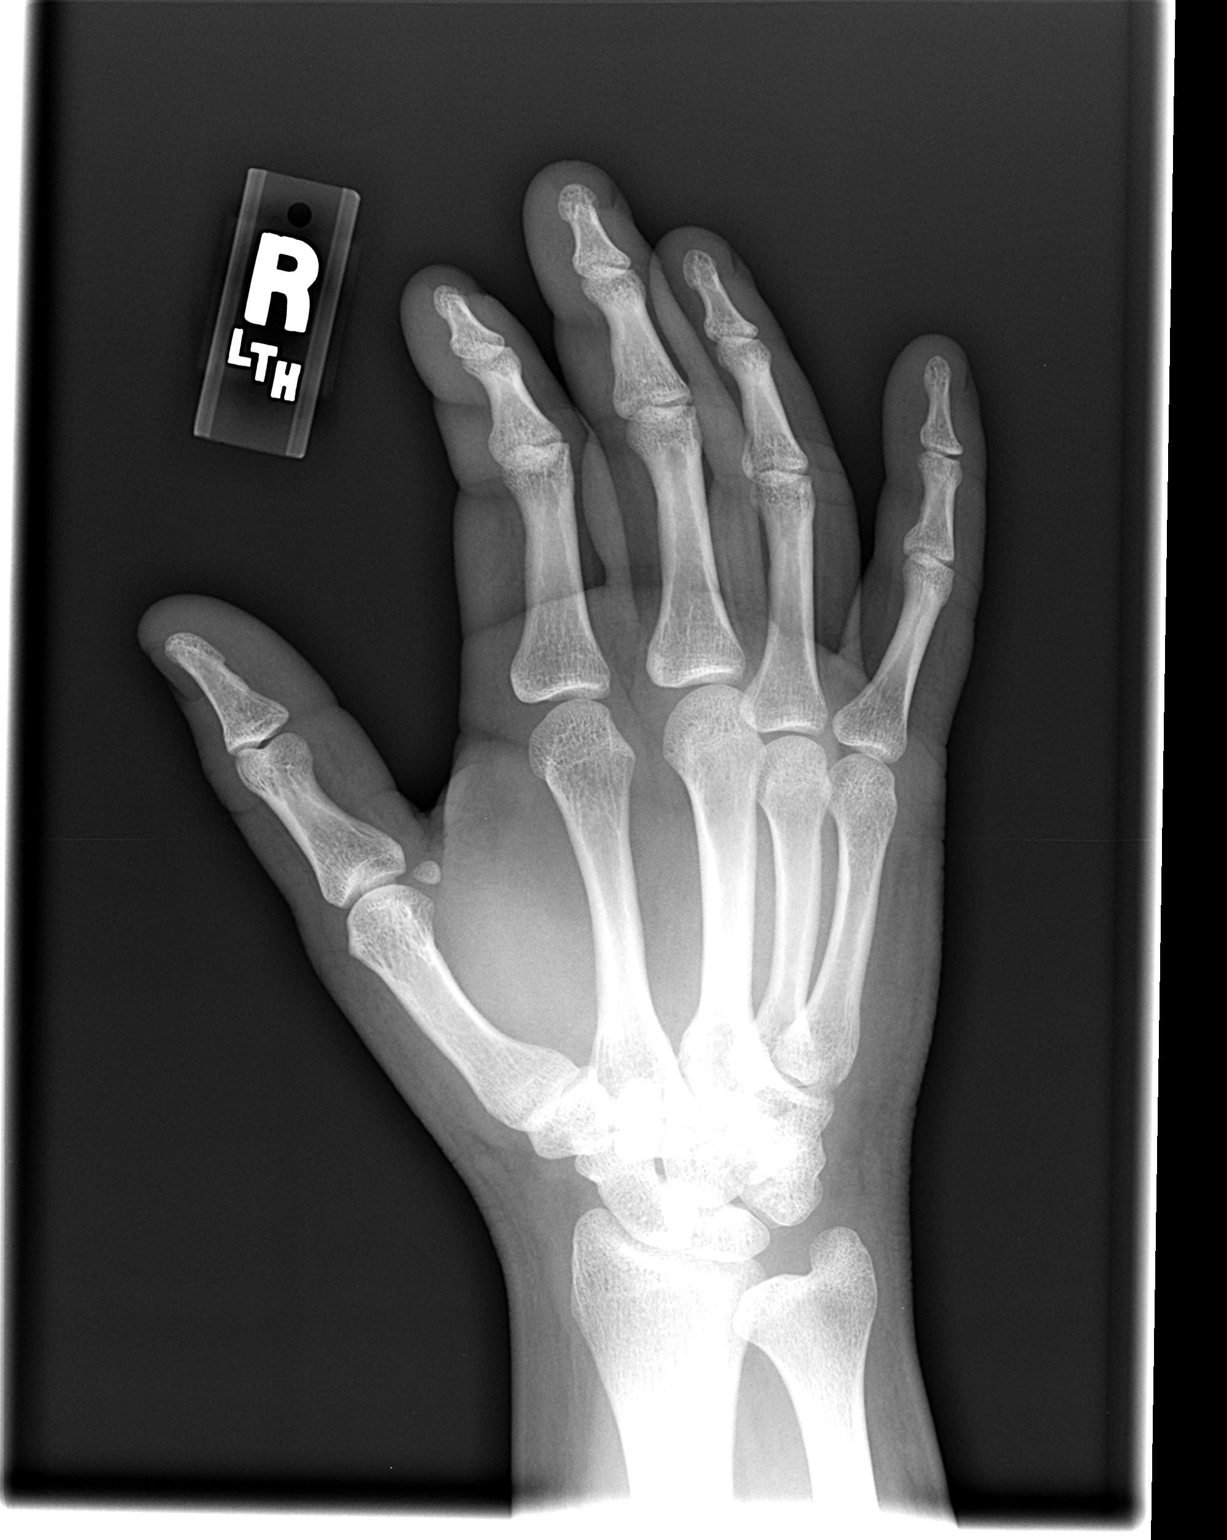

[view not recorded (3 of 3)]
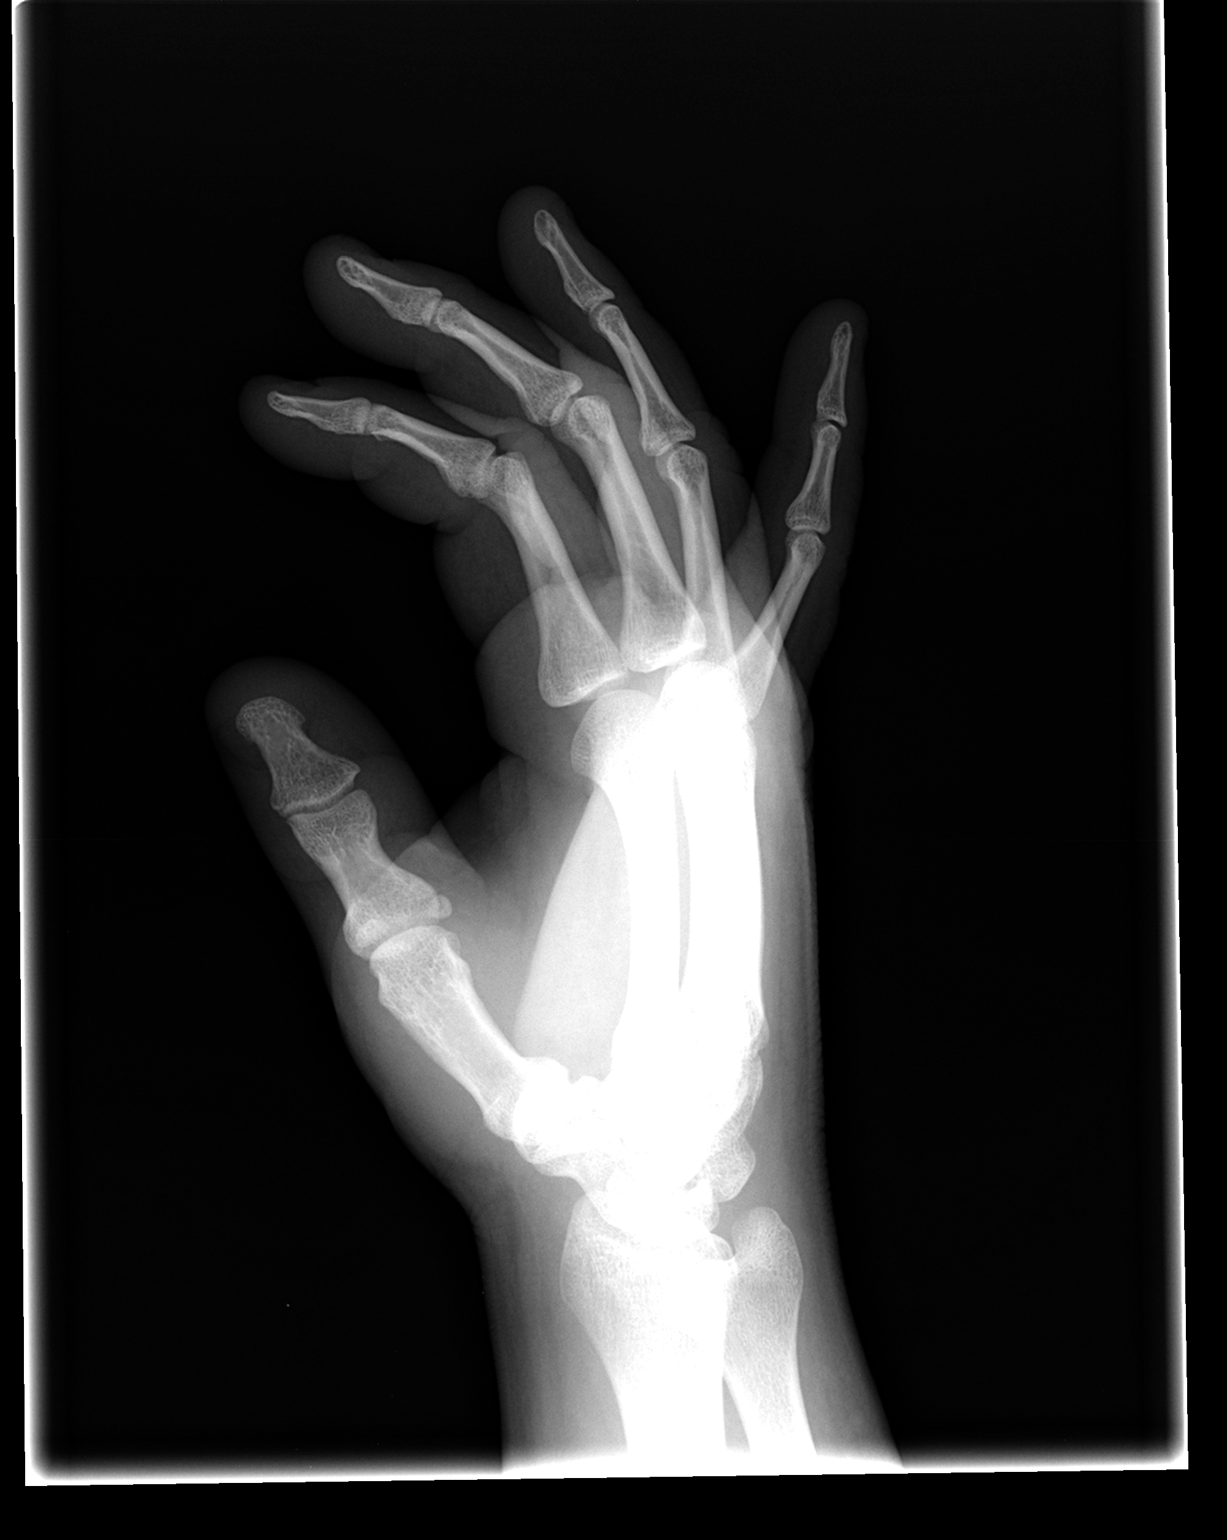

[3 of 3 positions shown; findings below may reference images not displayed]

FINDINGS: There is no evidence of fracture or dislocation. No other soft tissue or bone abnormalities are identified.
IMPRESSION: Negative.

## 2007-02-10 ENCOUNTER — Encounter (INDEPENDENT_AMBULATORY_CARE_PROVIDER_SITE_OTHER): Payer: Self-pay | Admitting: Specialist

## 2007-02-10 ENCOUNTER — Observation Stay (HOSPITAL_COMMUNITY): Admission: RE | Admit: 2007-02-10 | Discharge: 2007-02-11 | Payer: Self-pay | Admitting: General Surgery

## 2007-02-12 ENCOUNTER — Encounter (HOSPITAL_COMMUNITY): Admission: RE | Admit: 2007-02-12 | Discharge: 2007-03-14 | Payer: Self-pay | Admitting: General Surgery

## 2007-07-25 ENCOUNTER — Emergency Department (HOSPITAL_COMMUNITY): Admission: EM | Admit: 2007-07-25 | Discharge: 2007-07-25 | Payer: Self-pay | Admitting: Emergency Medicine

## 2007-07-25 IMAGING — CR DG ANKLE COMPLETE 3+V*L*
3 series · 3 of 3 positions shown · non-contrast
Comparison: none

CLINICAL DATA: Stepped in a hole, twisting ankle with pain.
 LEFT ANKLE ? 3 VIEW:

[view not recorded (1 of 3)]
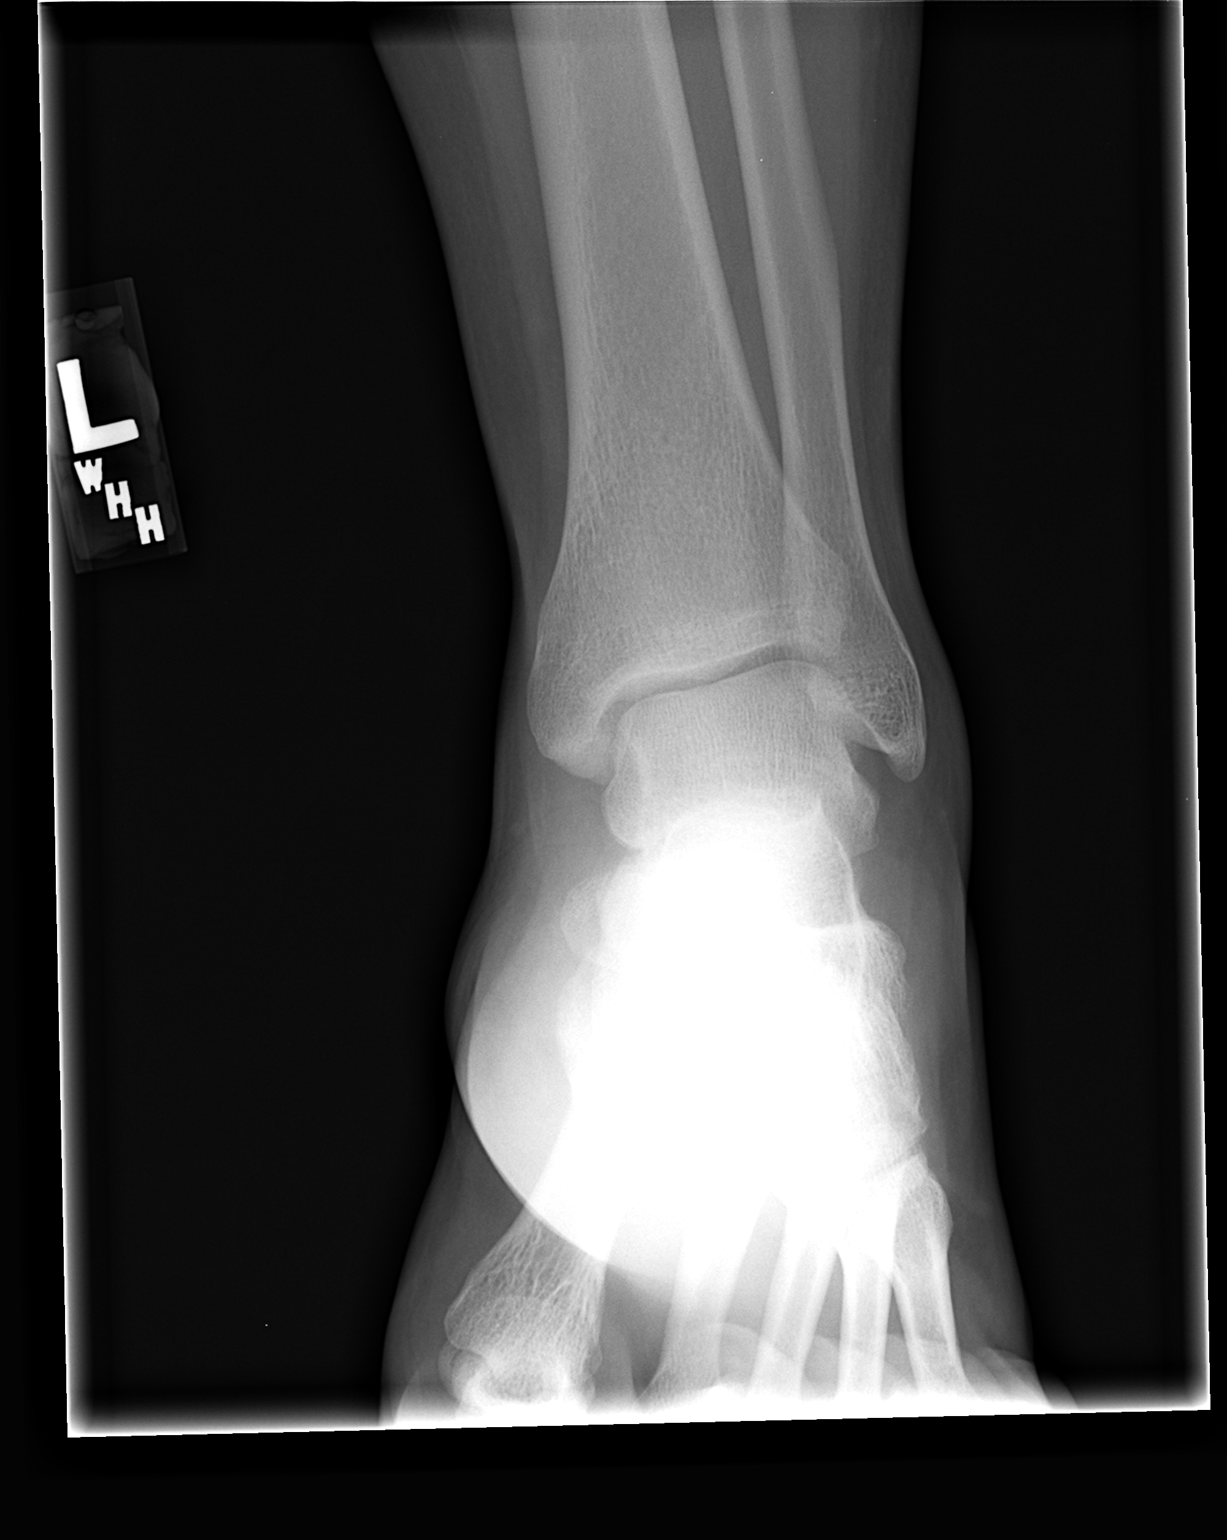

[view not recorded (2 of 3)]
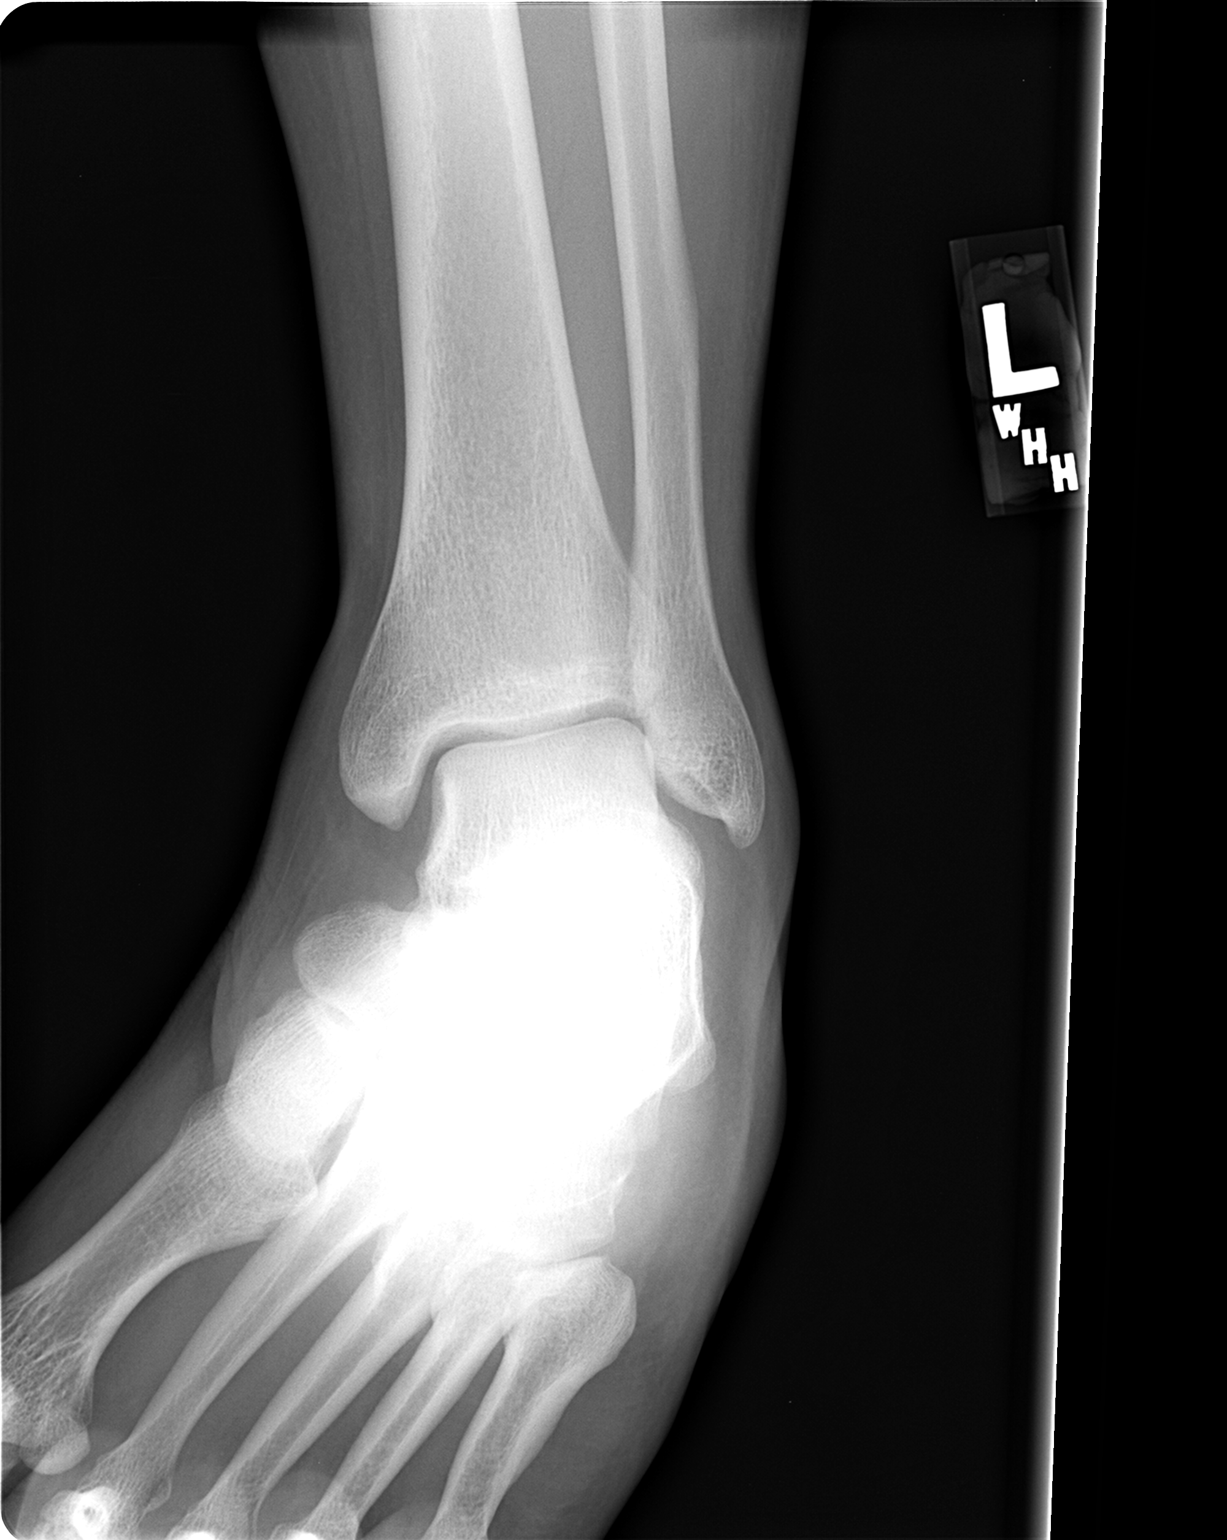

[view not recorded (3 of 3)]
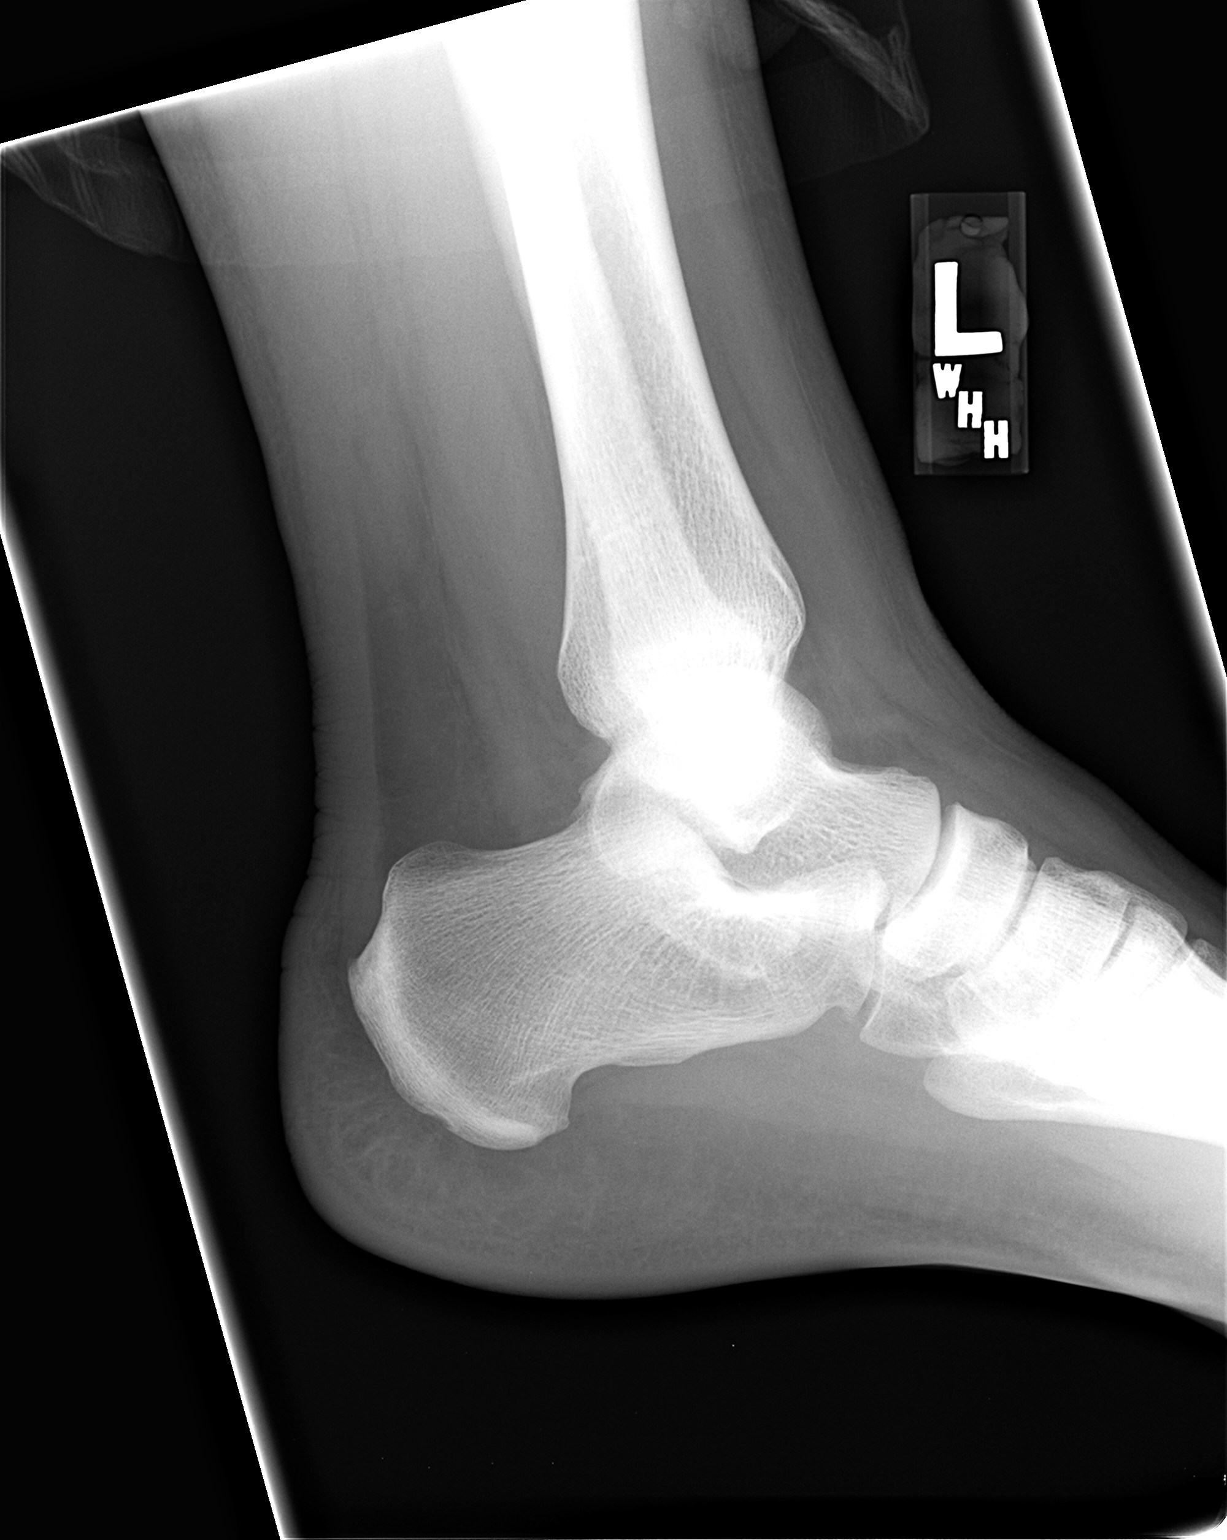

[3 of 3 positions shown; findings below may reference images not displayed]

FINDINGS: Three views of the left ankle were obtained.  No acute fracture is seen.  The ankle joint appears normal.
IMPRESSION: No fracture.

## 2007-12-18 ENCOUNTER — Emergency Department (HOSPITAL_COMMUNITY): Admission: EM | Admit: 2007-12-18 | Discharge: 2007-12-18 | Payer: Self-pay | Admitting: Emergency Medicine

## 2008-01-06 ENCOUNTER — Emergency Department (HOSPITAL_COMMUNITY): Admission: EM | Admit: 2008-01-06 | Discharge: 2008-01-07 | Payer: Self-pay | Admitting: Emergency Medicine

## 2008-01-23 ENCOUNTER — Emergency Department (HOSPITAL_COMMUNITY): Admission: EM | Admit: 2008-01-23 | Discharge: 2008-01-23 | Payer: Self-pay | Admitting: Emergency Medicine

## 2008-03-17 ENCOUNTER — Emergency Department (HOSPITAL_COMMUNITY): Admission: EM | Admit: 2008-03-17 | Discharge: 2008-03-17 | Payer: Self-pay | Admitting: Emergency Medicine

## 2008-03-17 IMAGING — CR DG TIBIA/FIBULA 2V*L*
2 series · 2 of 2 positions shown · non-contrast
Comparison: None

CLINICAL DATA: Fall with left lower leg pain.

LEFT TIBIA AND FIBULA - 2 VIEW

[view not recorded (1 of 2)]
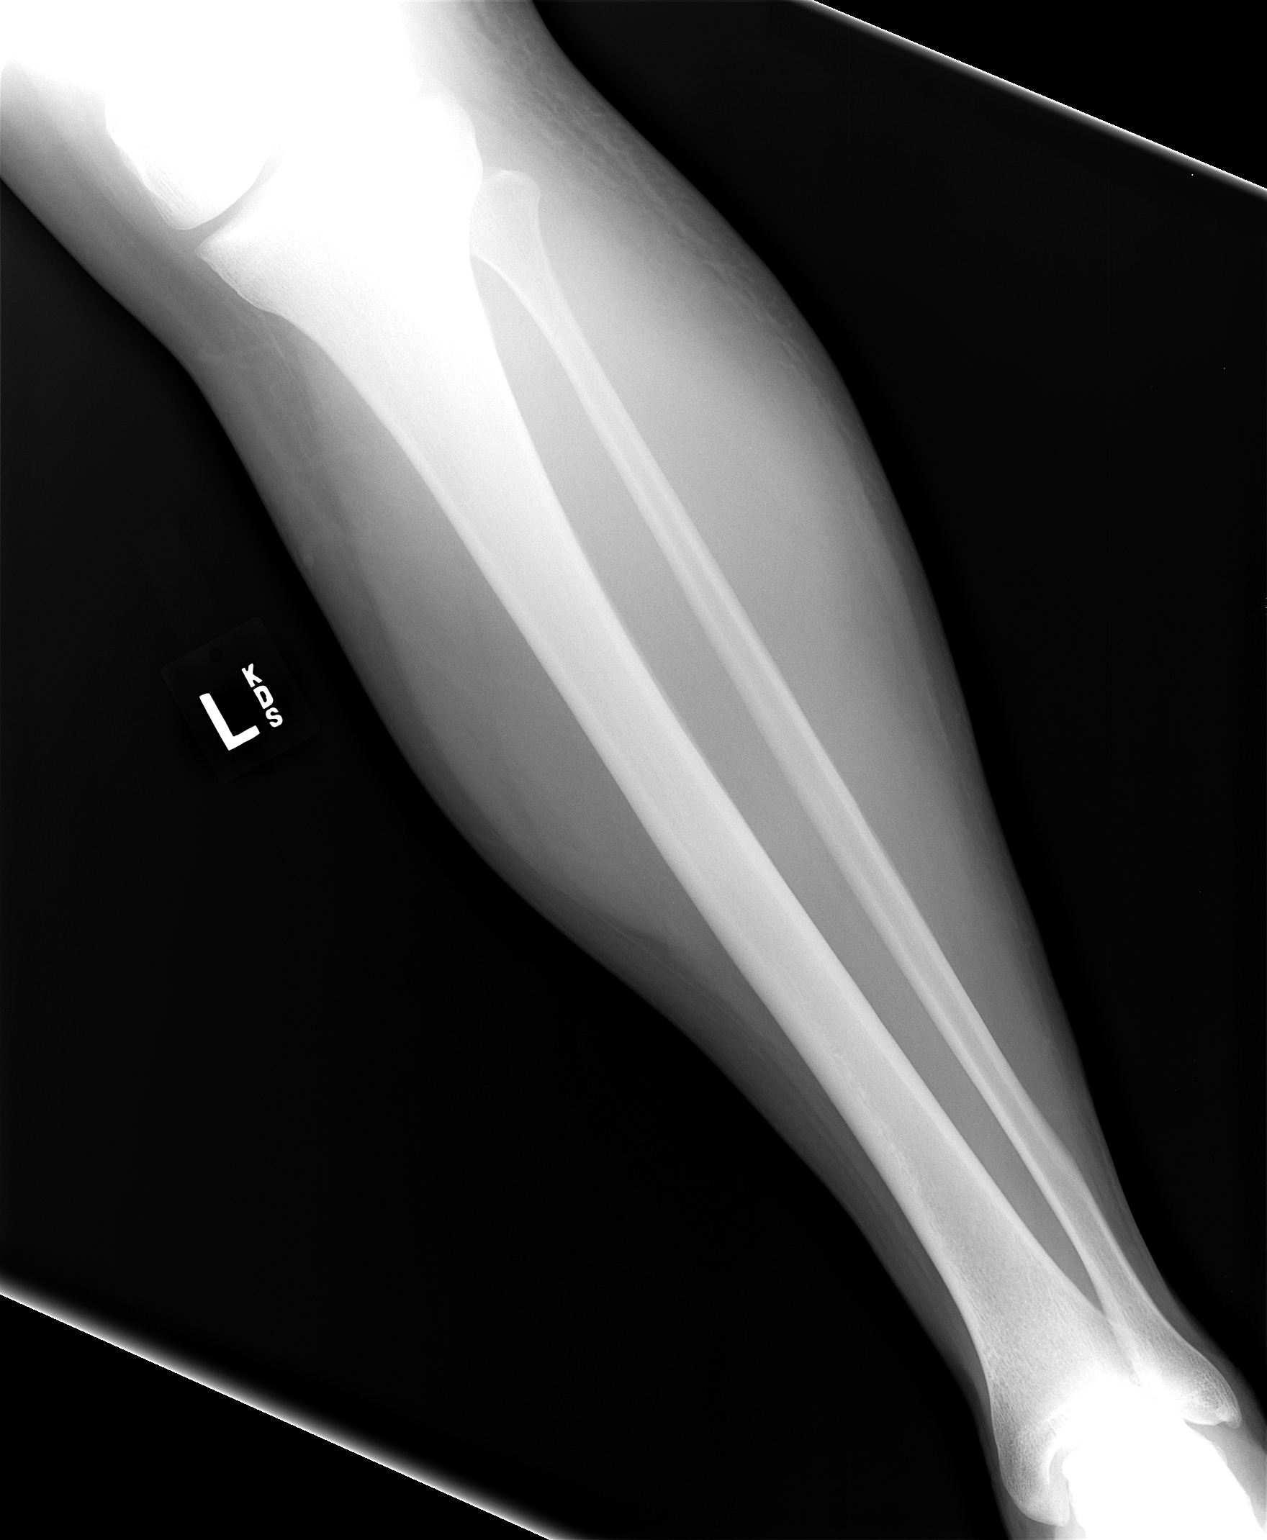

[view not recorded (2 of 2)]
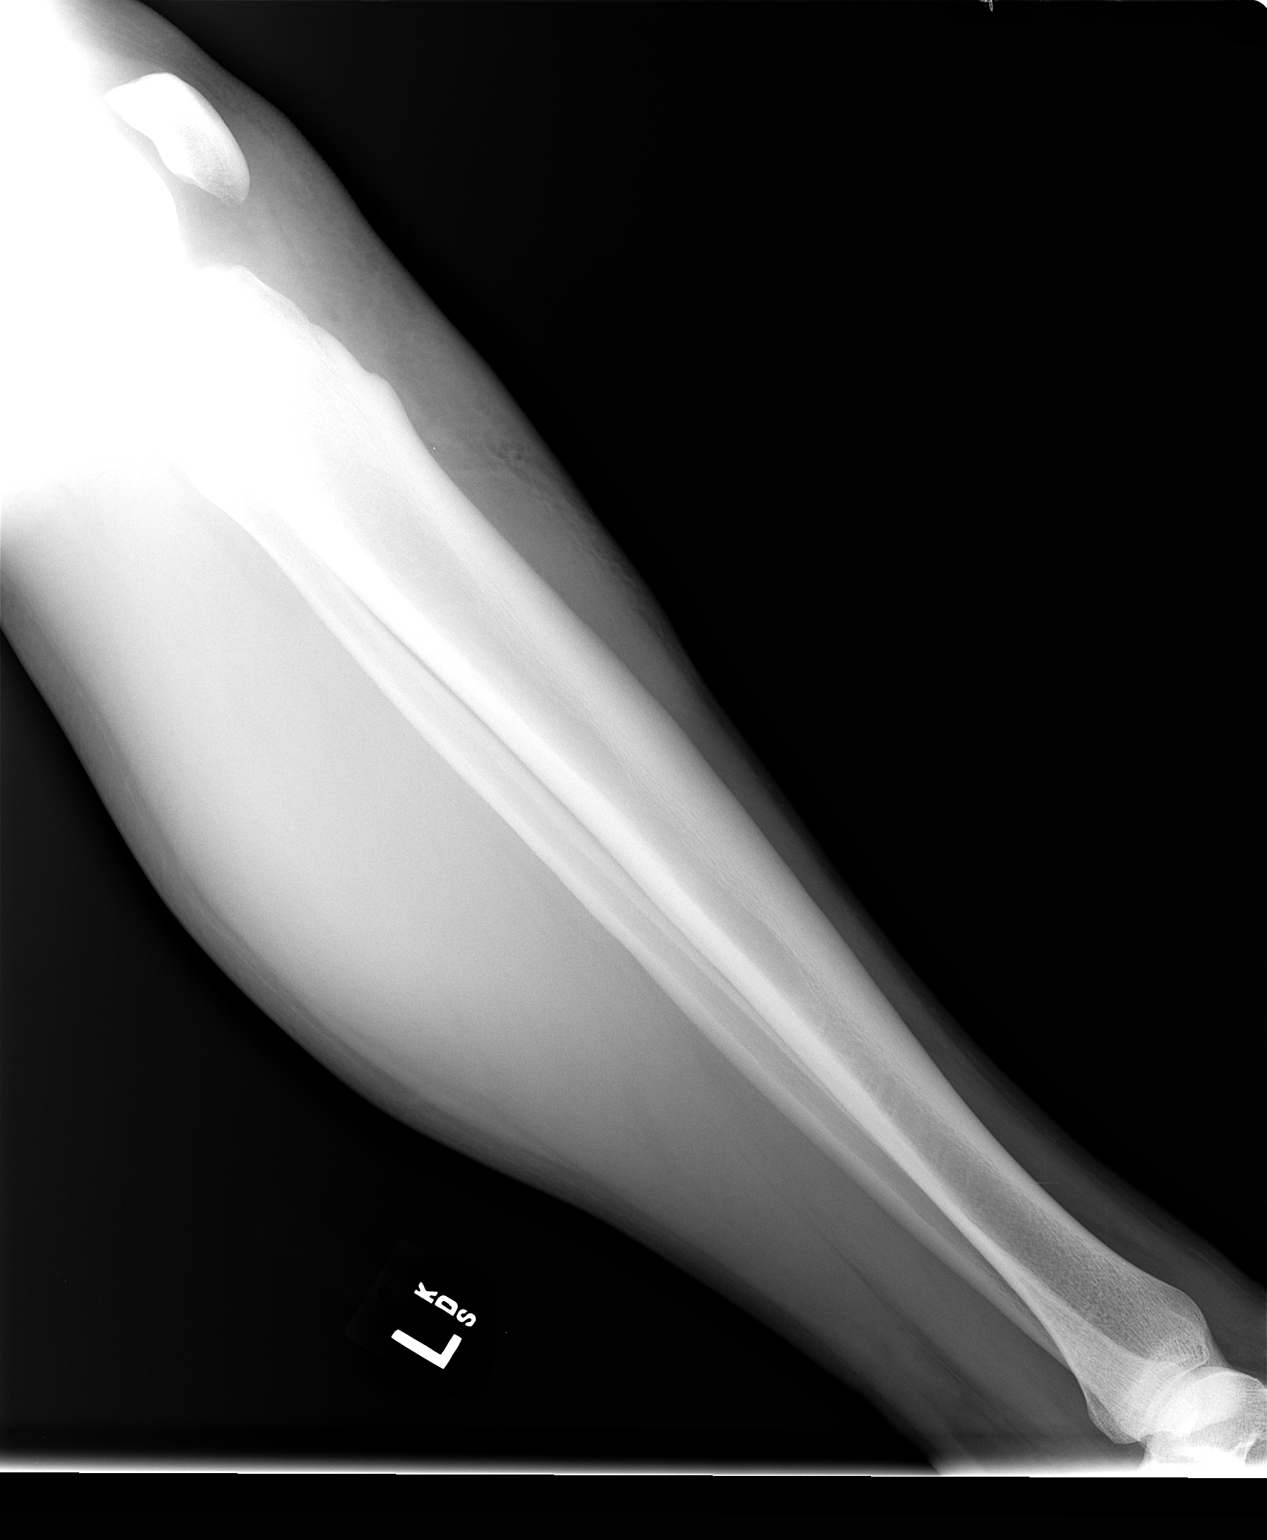

[2 of 2 positions shown; findings below may reference images not displayed]

FINDINGS: Anterior soft tissue swelling is identified without
evidence of acute fracture, subluxation, or dislocation.
No acute bony abnormalities are noted.
IMPRESSION: Soft tissue swelling without acute bony abnormality.

## 2008-07-20 ENCOUNTER — Emergency Department (HOSPITAL_COMMUNITY): Admission: EM | Admit: 2008-07-20 | Discharge: 2008-07-20 | Payer: Self-pay | Admitting: Emergency Medicine

## 2008-07-30 ENCOUNTER — Emergency Department (HOSPITAL_COMMUNITY): Admission: EM | Admit: 2008-07-30 | Discharge: 2008-07-30 | Payer: Self-pay | Admitting: Emergency Medicine

## 2008-08-30 ENCOUNTER — Observation Stay (HOSPITAL_COMMUNITY): Admission: RE | Admit: 2008-08-30 | Discharge: 2008-08-31 | Payer: Self-pay | Admitting: General Surgery

## 2008-08-30 ENCOUNTER — Encounter (INDEPENDENT_AMBULATORY_CARE_PROVIDER_SITE_OTHER): Payer: Self-pay | Admitting: General Surgery

## 2009-01-03 ENCOUNTER — Ambulatory Visit (HOSPITAL_COMMUNITY): Admission: RE | Admit: 2009-01-03 | Discharge: 2009-01-03 | Payer: Self-pay | Admitting: Internal Medicine

## 2009-01-03 IMAGING — CT CT ABDOMEN W/ CM
1 of 2 series · 15 of 32 positions shown, 19 images · IV contrast (Omnipaque 300)
Comparison: Report of abdomen and pelvis CT scan [DATE].
Images are not available.

CT ABDOMEN

CLINICAL DATA: Right side abdominal pain.  Diarrhea.

CT ABDOMEN AND PELVIS WITH CONTRAST
TECHNIQUE: Multidetector CT imaging of the abdomen and pelvis was
performed using the standard protocol following bolus
administration of intravenous contrast.
Contrast: 100 ml [HD].

[Series 2: abd_pel_with 5.0 b40f · axial · 0.84mm/px · z∈[+378,+868]mm · 15 of 108 slices shown, 19 images]
[im 5/108  soft-tissue]
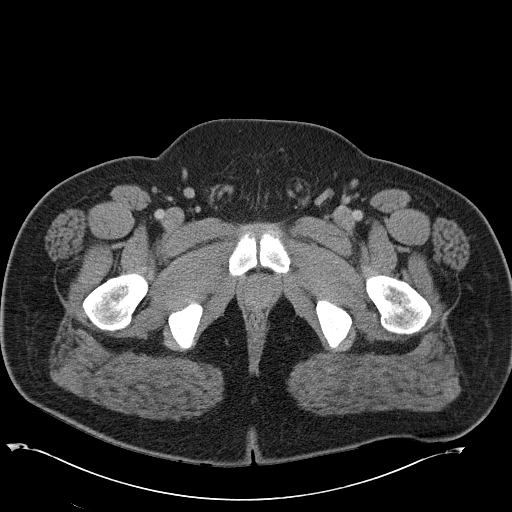
[im 5/108  bone]
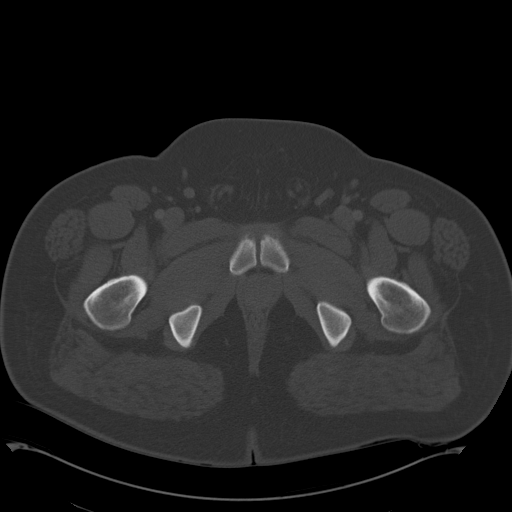
[im 15/108  soft-tissue]
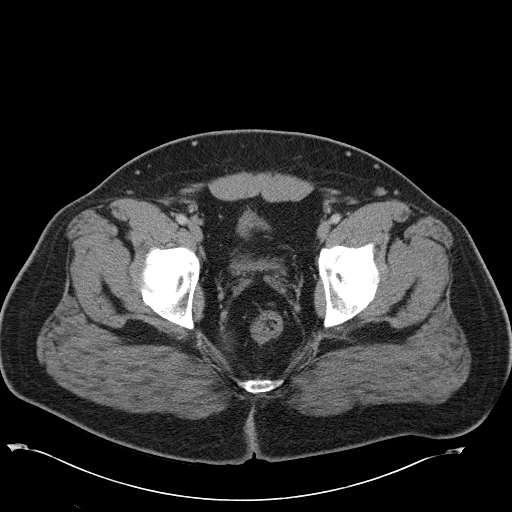
[im 25/108  soft-tissue]
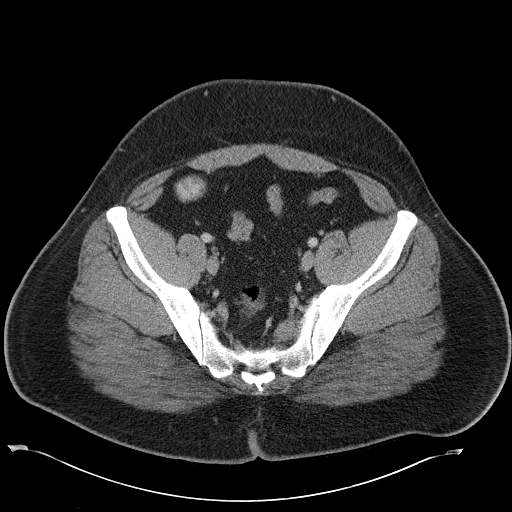
[im 30/108  soft-tissue]
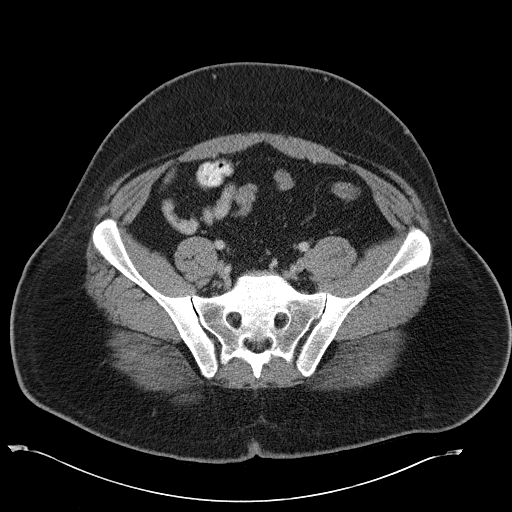
[im 39/108  soft-tissue]
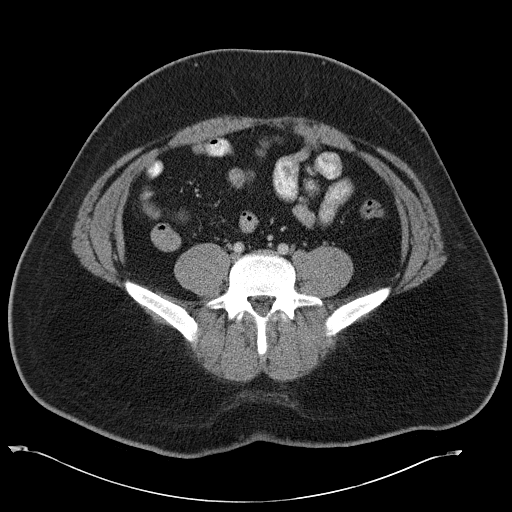
[im 44/108  soft-tissue]
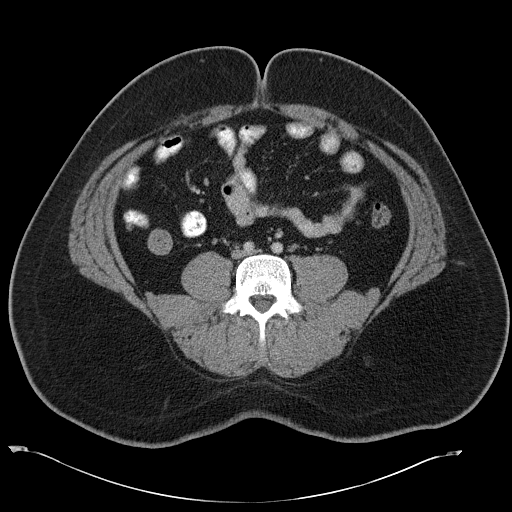
[im 54/108  soft-tissue]
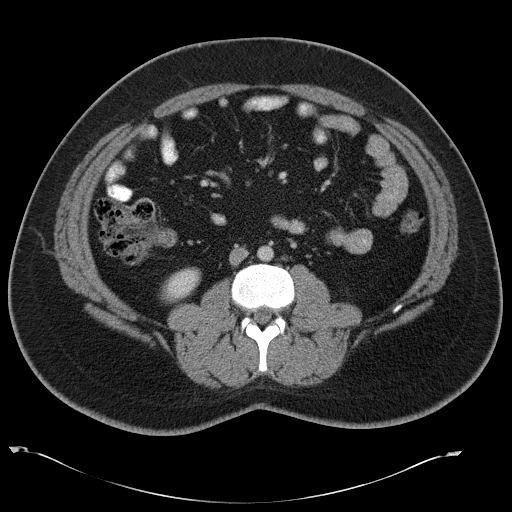
[im 64/108  soft-tissue]
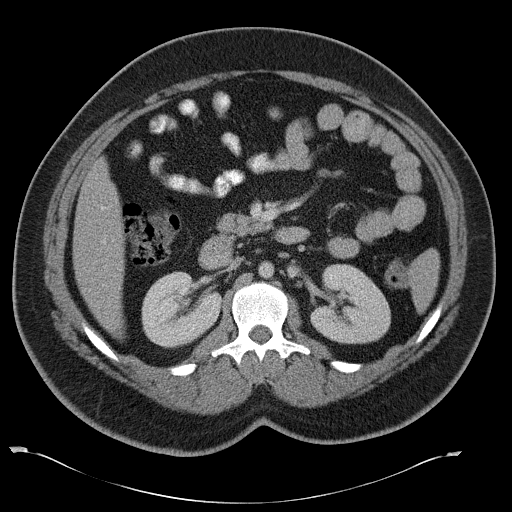
[im 69/108  soft-tissue]
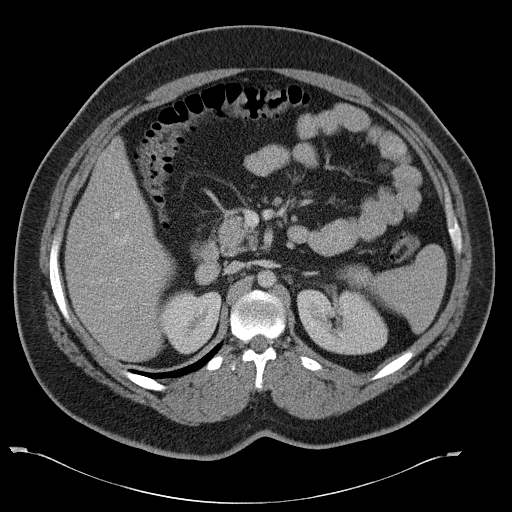
[im 69/108  bone]
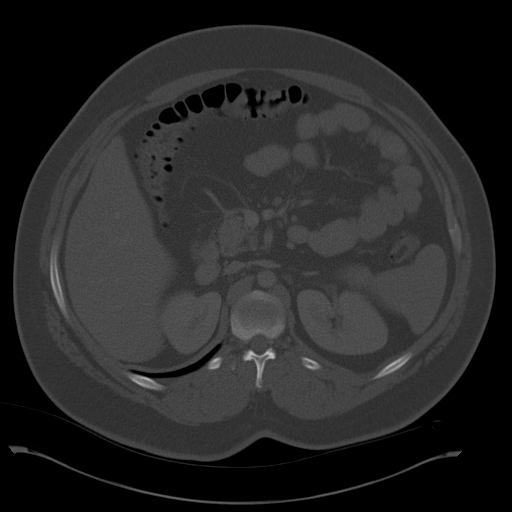
[im 78/108  soft-tissue]
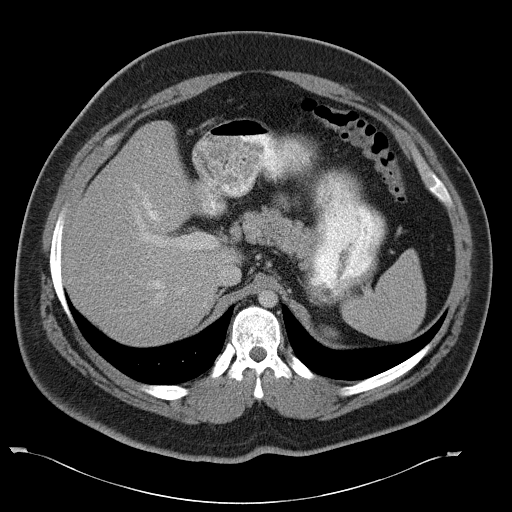
[im 83/108  soft-tissue]
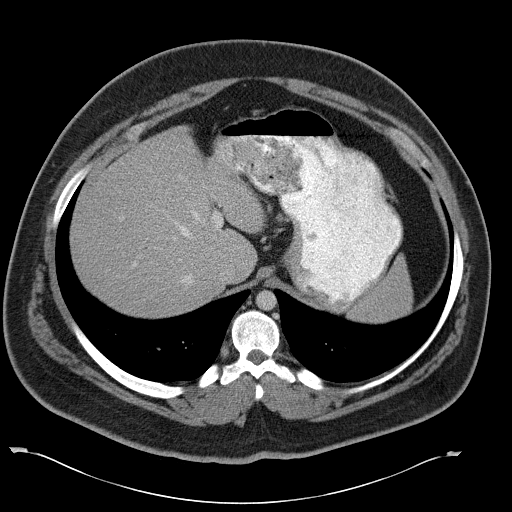
[im 88/108  lung]
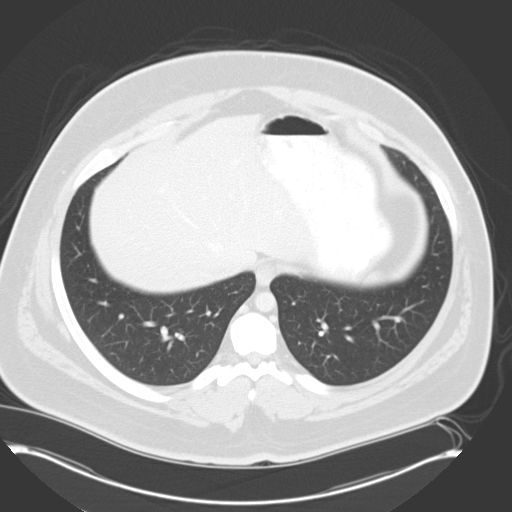
[im 93/108  soft-tissue]
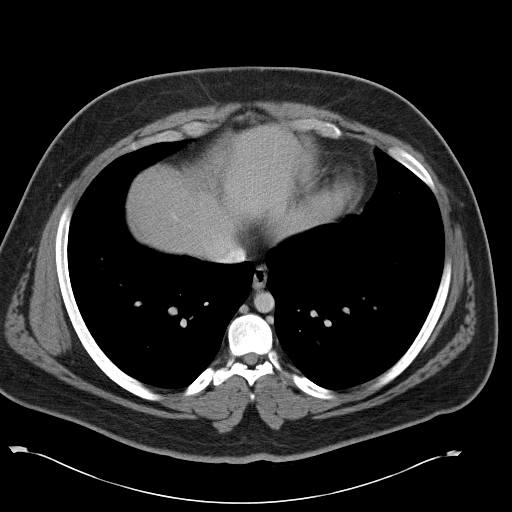
[im 93/108  lung]
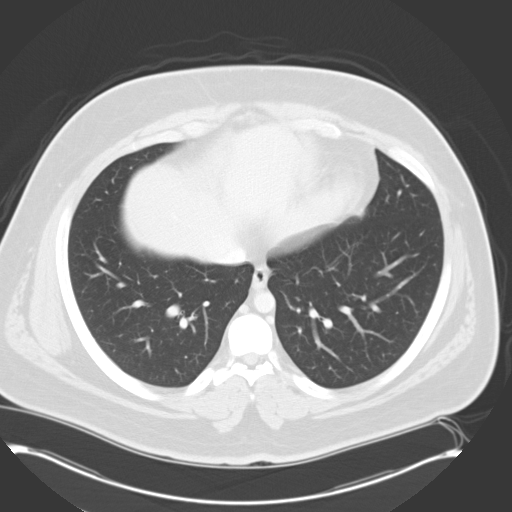
[im 98/108  lung]
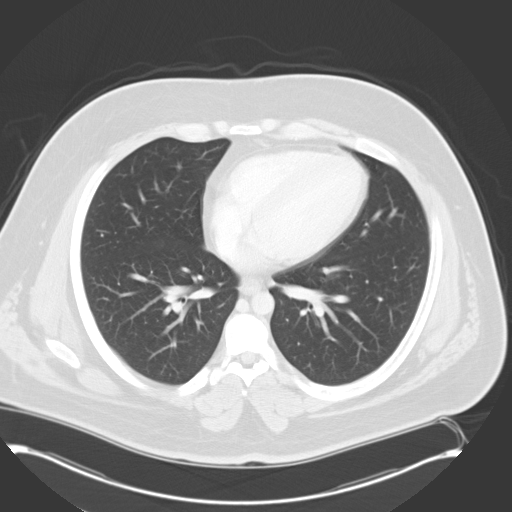
[im 103/108  soft-tissue]
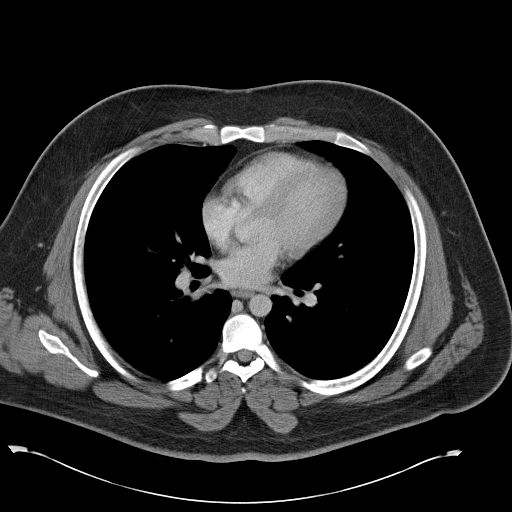
[im 103/108  lung]
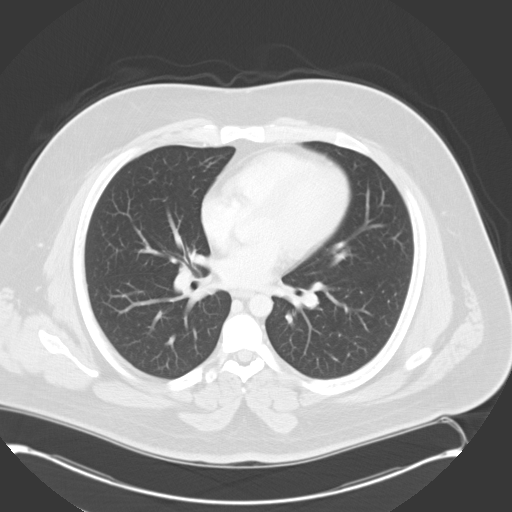

[15 of 32 positions shown; findings below may reference images not displayed]

FINDINGS: The lung bases are clear.  There is no pleural or
pericardial effusion.

The liver, gallbladder, adrenal glands, kidneys, spleen and
pancreas all appear normal.  The stomach and small bowel appear
normal.  In particular, the terminal ileum is well seen and normal
in appearance.  There is no abdominal lymphadenopathy or fluid.  No
focal bony abnormality.
IMPRESSION: Negative abdomen CT scan.  Specifically, no evidence of Crohn's
disease.

CT PELVIS
FINDINGS: The colon has a normal CT appearance.  No diverticular
disease or inflammatory change is seen.  The appendix is well
visualized and appears normal.  There is no pelvic fluid or
lymphadenopathy.  Urinary bladder, prostate gland seminal vesicles
all appear normal.  There is no focal bony abnormality.
IMPRESSION: Normal pelvic CT scan.  No evidence of diverticular disease.

## 2009-02-10 ENCOUNTER — Emergency Department (HOSPITAL_COMMUNITY): Admission: EM | Admit: 2009-02-10 | Discharge: 2009-02-10 | Payer: Self-pay | Admitting: Emergency Medicine

## 2009-03-17 ENCOUNTER — Emergency Department (HOSPITAL_COMMUNITY): Admission: EM | Admit: 2009-03-17 | Discharge: 2009-03-17 | Payer: Self-pay | Admitting: Emergency Medicine

## 2009-05-20 ENCOUNTER — Emergency Department (HOSPITAL_COMMUNITY): Admission: EM | Admit: 2009-05-20 | Discharge: 2009-05-20 | Payer: Self-pay | Admitting: Emergency Medicine

## 2010-01-23 ENCOUNTER — Emergency Department (HOSPITAL_COMMUNITY): Admission: EM | Admit: 2010-01-23 | Discharge: 2010-01-23 | Payer: Self-pay | Admitting: Emergency Medicine

## 2010-01-23 IMAGING — CR DG ABDOMEN ACUTE W/ 1V CHEST
4 series · 4 of 4 positions shown · non-contrast
Comparison: CT abdomen and pelvis [DATE] and acute abdomen
series [DATE].

CLINICAL DATA: 2-day history of abdominal pain and constipation.

ACUTE ABDOMEN SERIES (ABDOMEN 2 VIEW & CHEST 1 VIEW) [DATE]:

[view not recorded (1 of 4)]
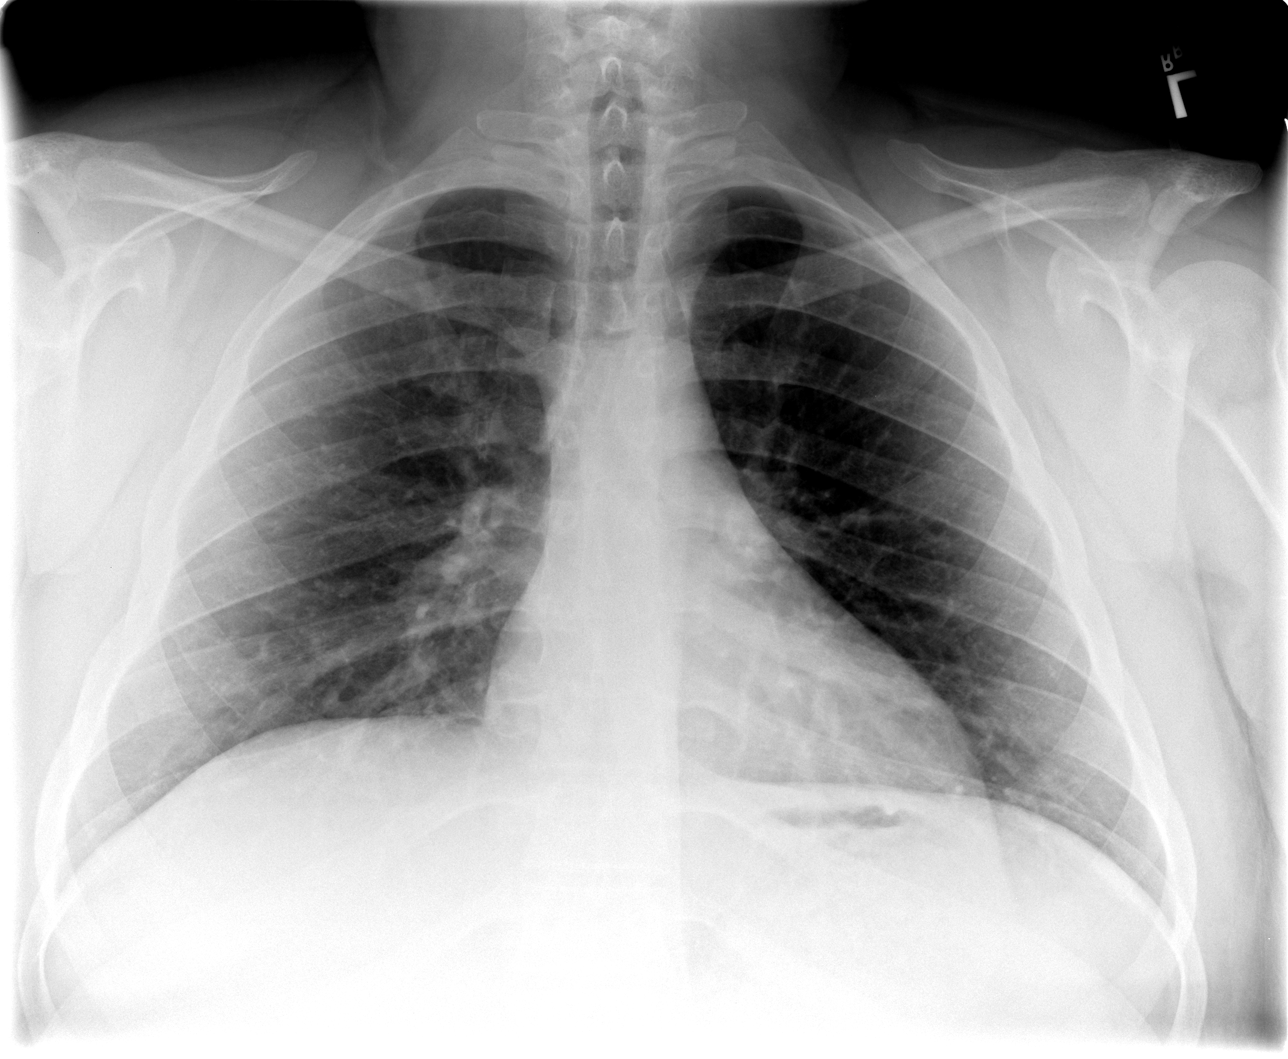

[view not recorded (2 of 4)]
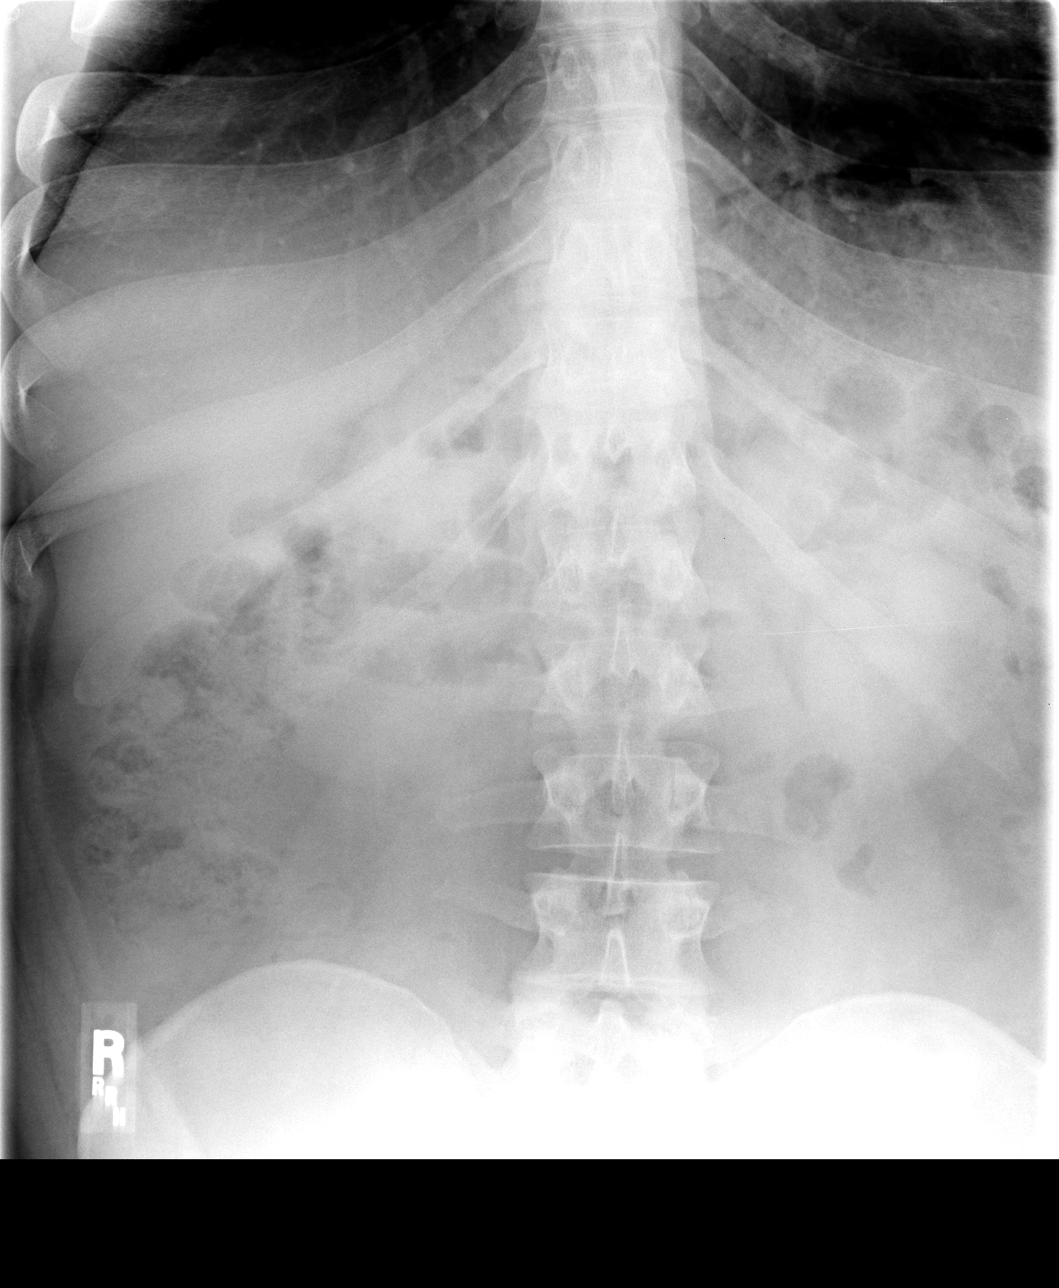

[view not recorded (3 of 4)]
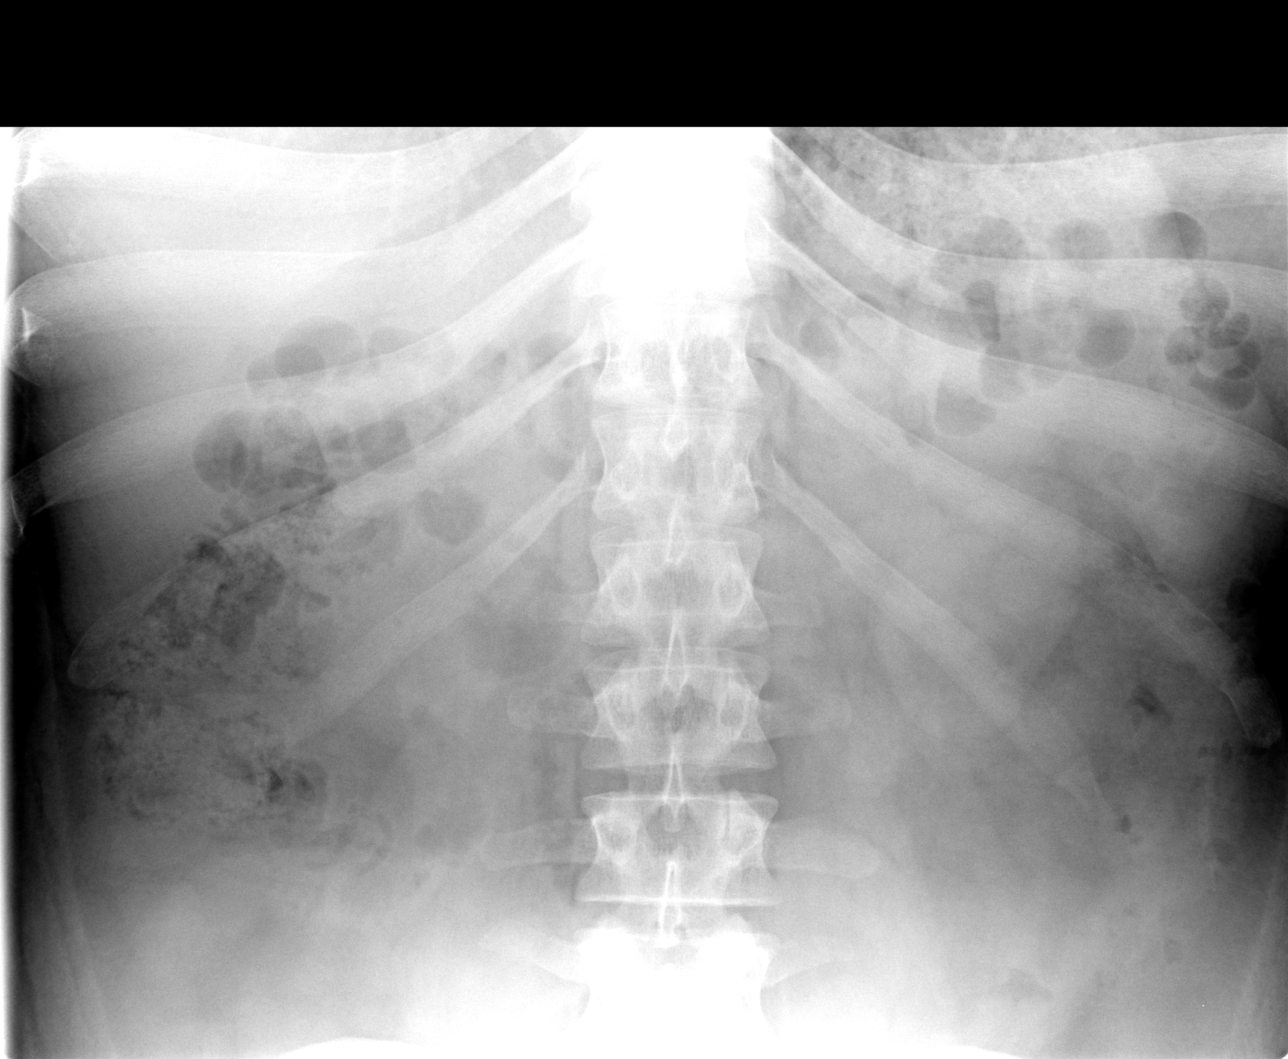

[view not recorded (4 of 4)]
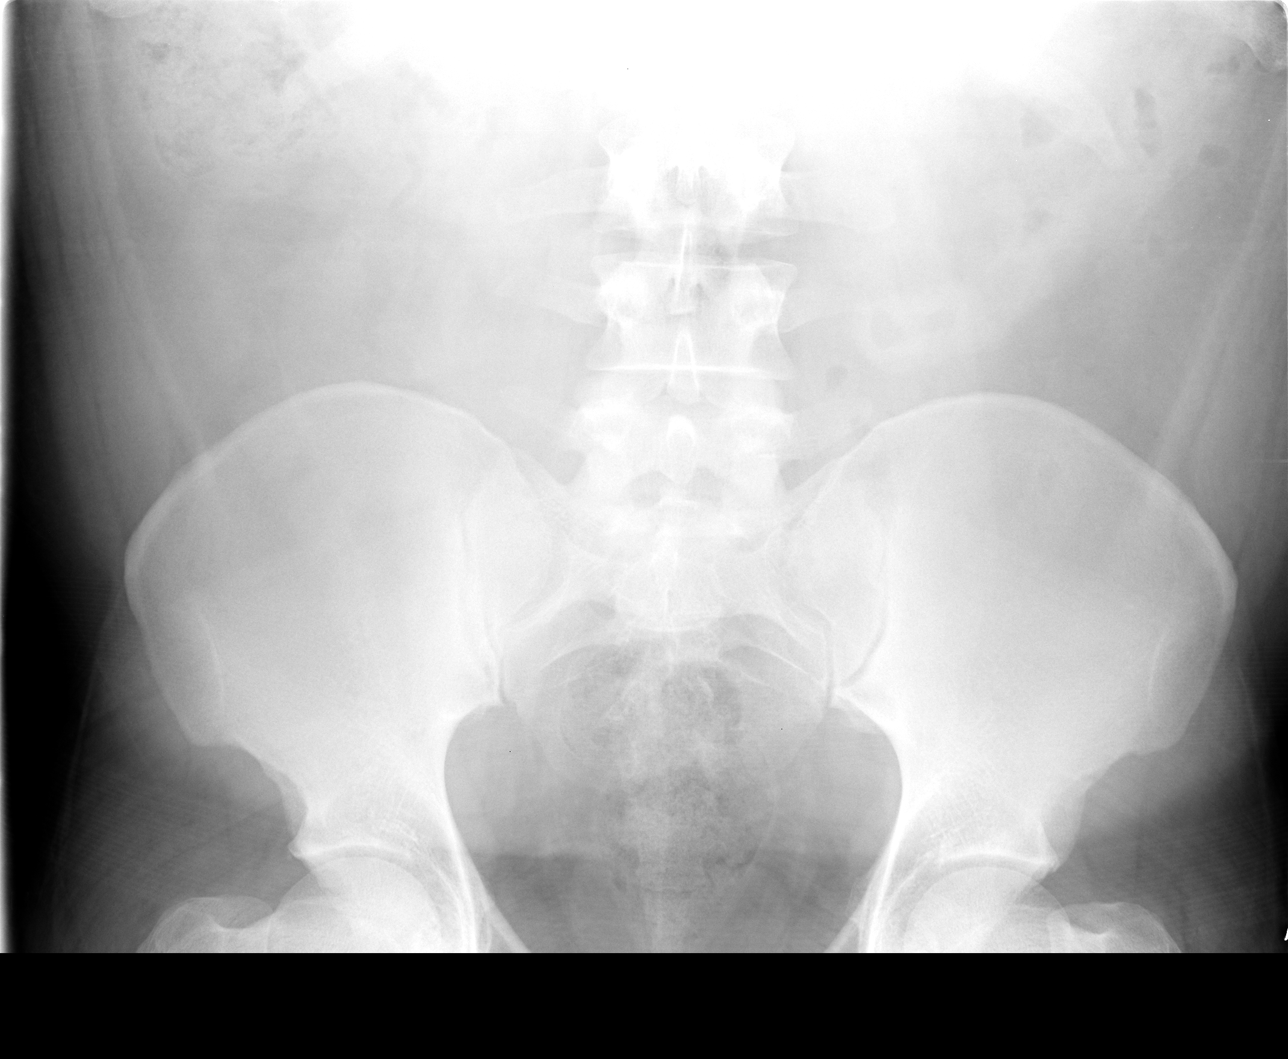

[4 of 4 positions shown; findings below may reference images not displayed]

FINDINGS: Bowel gas pattern unremarkable without evidence of
obstruction or significant ileus.  Moderate stool throughout the
colon from cecum to rectum.  No evidence of free air or significant
air fluid levels on the erect image.  Visible psoas margins.  No
visible opaque urinary tract calculi.  Regional skeleton
unremarkable.

Heart size upper normal to perhaps slightly enlarged but stable.
Hilar and mediastinal contours otherwise unremarkable.  Lungs
clear.  No pleural effusions.
IMPRESSION: 1.  No acute abdominal abnormalities.
2.  Borderline heart size.  No acute cardiopulmonary disease.

## 2010-01-24 ENCOUNTER — Ambulatory Visit (HOSPITAL_COMMUNITY): Admission: RE | Admit: 2010-01-24 | Discharge: 2010-01-24 | Payer: Self-pay | Admitting: Emergency Medicine

## 2010-01-24 IMAGING — US US ABDOMEN COMPLETE
1 series · 14 of 25 positions shown · non-contrast
Comparison: None.

CLINICAL DATA: Abdominal pain

ABDOMINAL ULTRASOUND COMPLETE

[Series 1: us abdomen complete · 0.28mm/px · 14 of 53 slices shown]
[im 1/53]
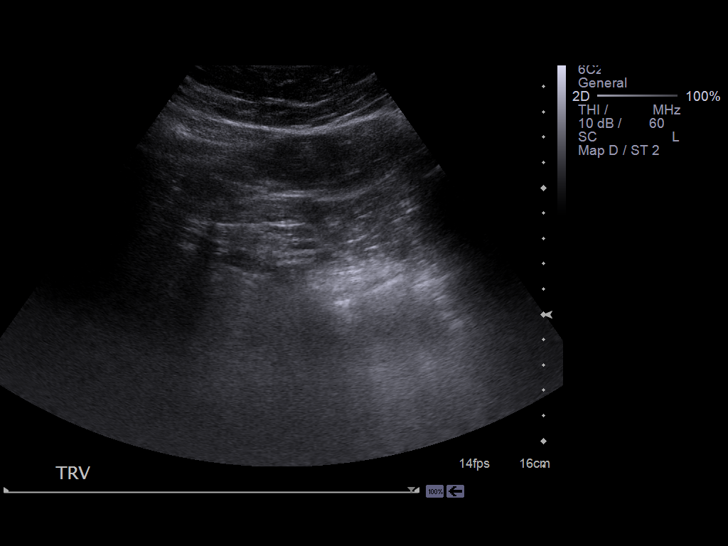
[im 5/53]
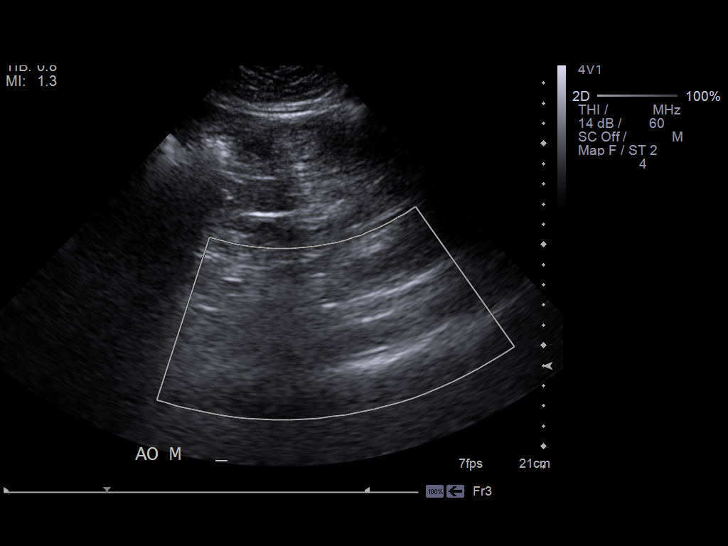
[im 9/53]
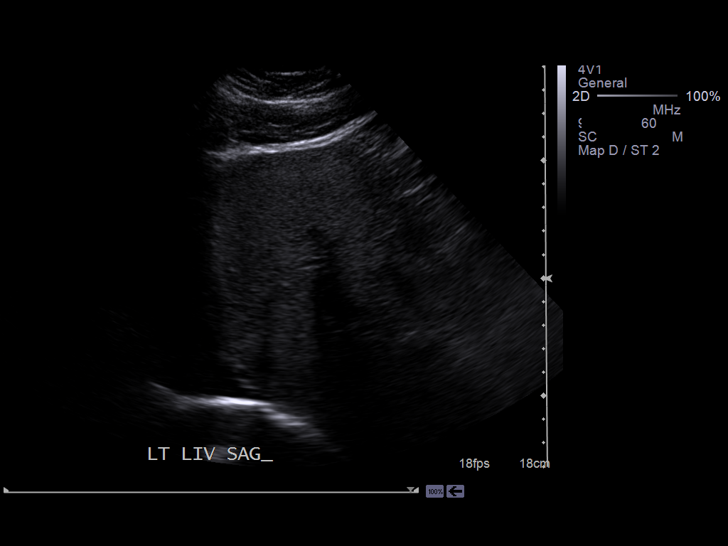
[im 14/53]
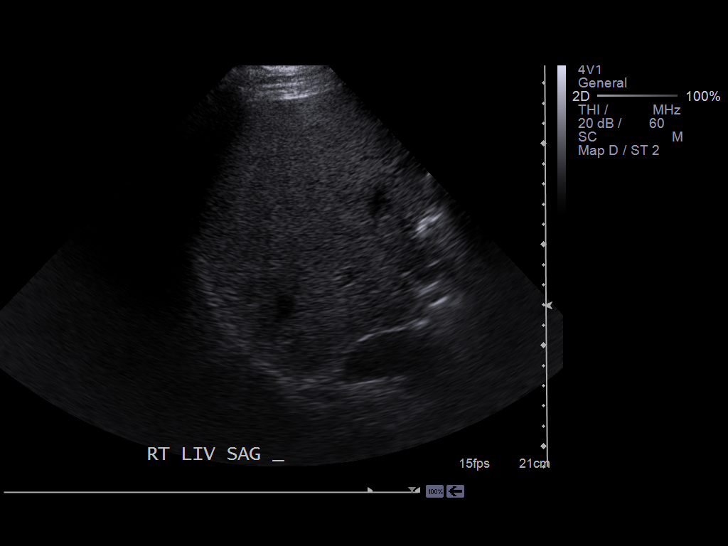
[im 18/53]
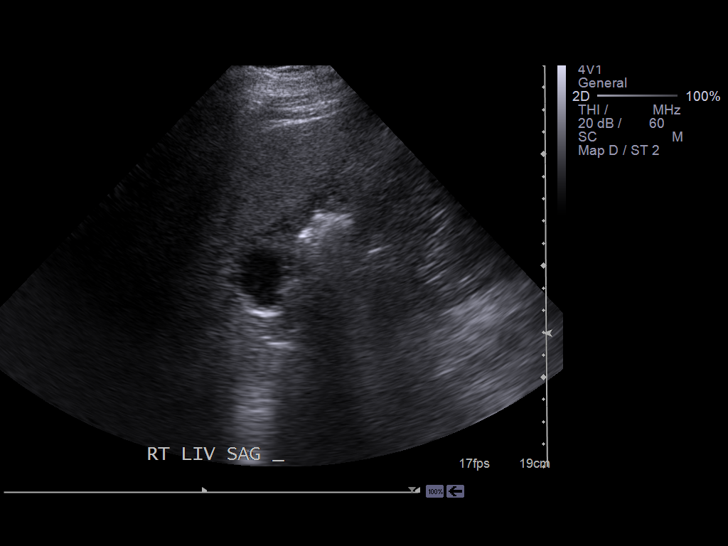
[im 20/53]
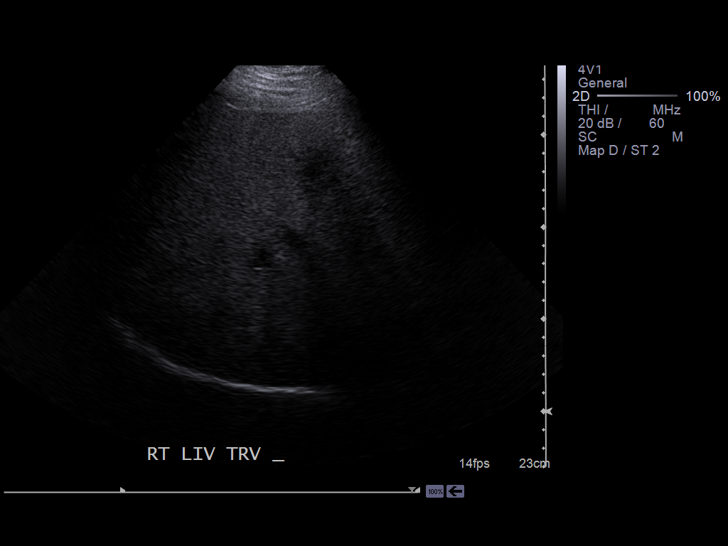
[im 24/53]
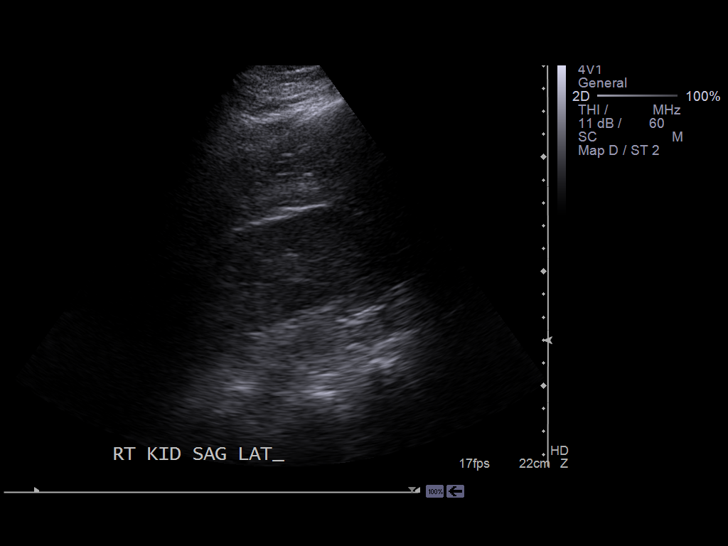
[im 29/53]
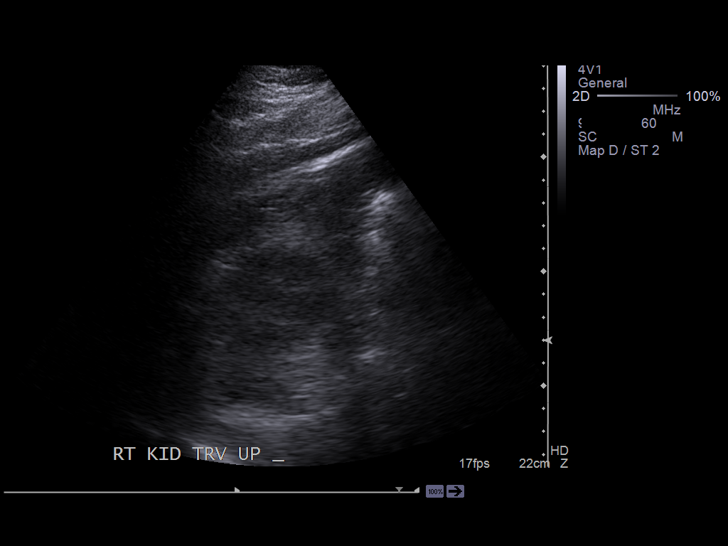
[im 33/53]
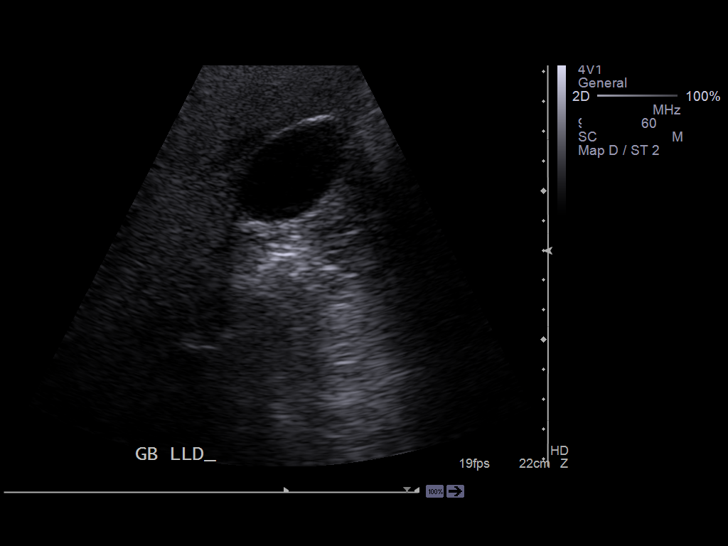
[im 35/53]
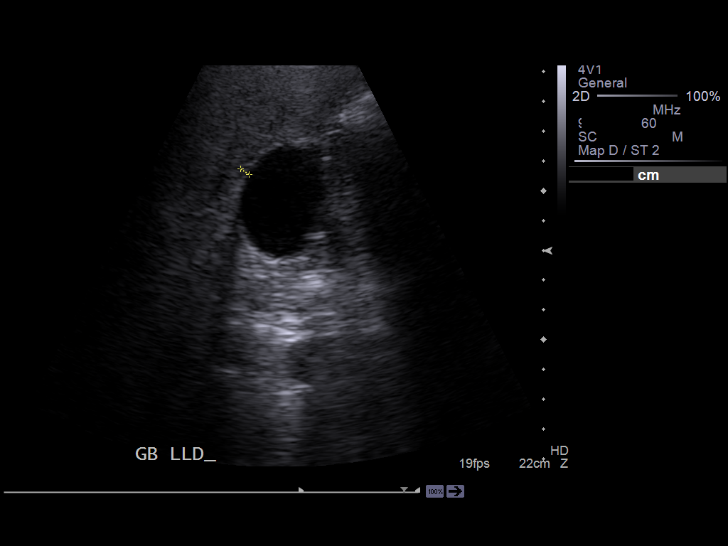
[im 40/53]
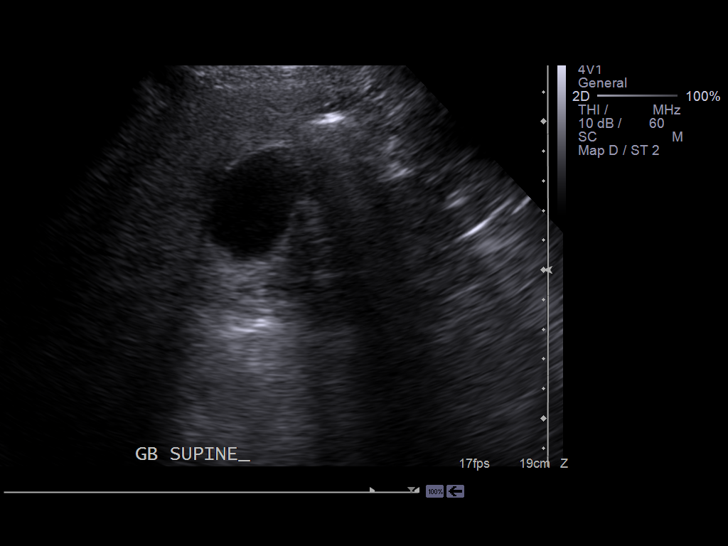
[im 44/53]
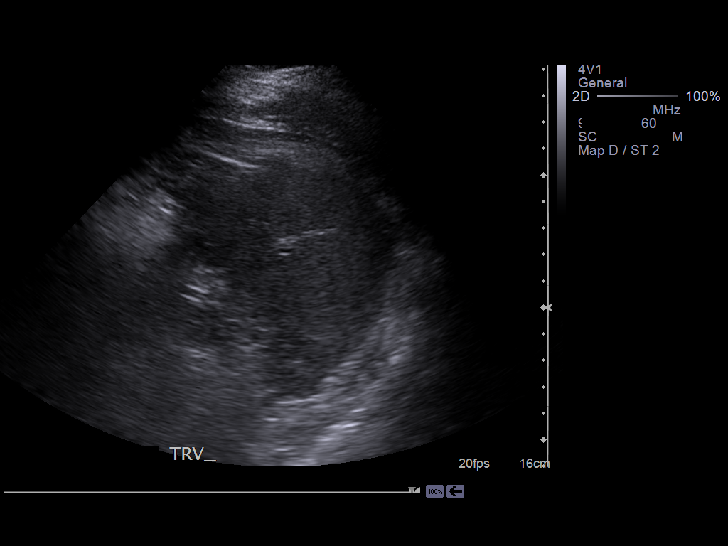
[im 48/53]
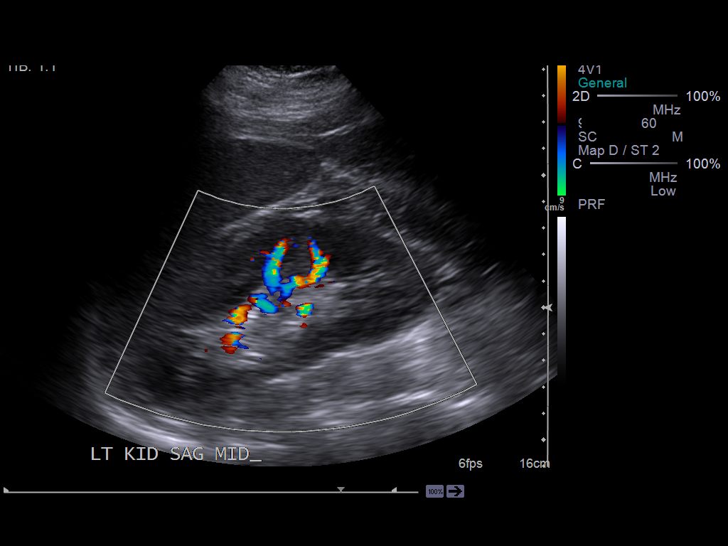
[im 53/53]
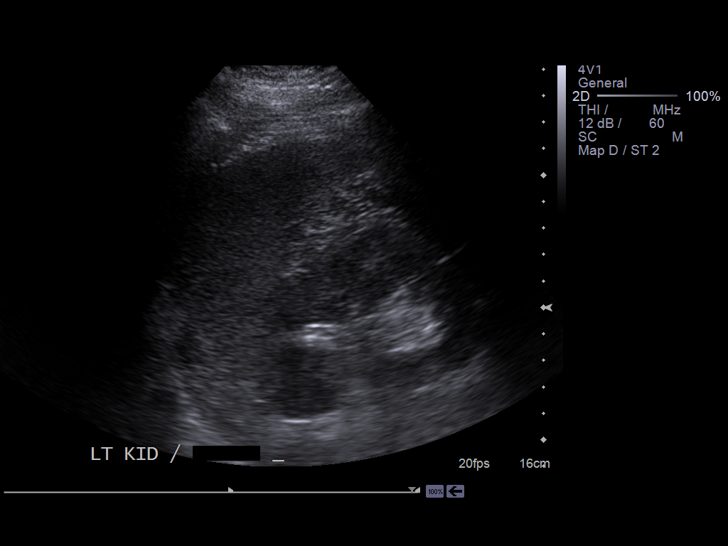

[14 of 25 positions shown; findings below may reference images not displayed]

FINDINGS: Gallbladder:  No gallstones, gallbladder wall thickening, or
pericholecystic fluid.  Negative ultrasound Murphy's sign.

Common Bile Duct:  Within normal limits in caliber.  0.6 mm

Liver:  No focal lesion identified.  Liver appears as though it may
be enlarged.  Diffuse increase in hepatic echogenicity may be due
to fatty infiltration.

IVC:  Not visualized due to overlying bowel gas.

Pancreas: Not visualized due to overlying bowel gas.

Spleen:  Within normal limits in size and echotexture.  10.8 cm
length.

Right kidney:  Normal in size and parenchymal echogenicity.  No
evidence of mass or hydronephrosis.   10.6 cm length.

Left kidney:  Normal in size and parenchymal echogenicity.  No
evidence of mass or hydronephrosis.  12.2 cm length.

Abdominal Aorta:  No aneurysm identified.  1.9 cm maximum diameter.
IMPRESSION: Normal gallbladder.  No biliary ductal dilatation.  Suggestion of
hepatomegaly.  Possible diffuse fatty infiltration of the liver.

## 2010-04-16 ENCOUNTER — Emergency Department (HOSPITAL_COMMUNITY): Admission: EM | Admit: 2010-04-16 | Discharge: 2010-04-16 | Payer: Self-pay | Admitting: Emergency Medicine

## 2010-04-19 ENCOUNTER — Emergency Department (HOSPITAL_COMMUNITY): Admission: EM | Admit: 2010-04-19 | Discharge: 2010-04-19 | Payer: Self-pay | Admitting: Emergency Medicine

## 2010-04-29 ENCOUNTER — Emergency Department (HOSPITAL_COMMUNITY): Admission: EM | Admit: 2010-04-29 | Discharge: 2010-04-30 | Payer: Self-pay | Admitting: Emergency Medicine

## 2010-06-22 ENCOUNTER — Emergency Department (HOSPITAL_COMMUNITY): Admission: EM | Admit: 2010-06-22 | Discharge: 2010-06-22 | Payer: Self-pay | Admitting: Emergency Medicine

## 2010-11-20 ENCOUNTER — Emergency Department (HOSPITAL_COMMUNITY)
Admission: EM | Admit: 2010-11-20 | Discharge: 2010-11-20 | Payer: Self-pay | Source: Home / Self Care | Admitting: Emergency Medicine

## 2010-11-20 IMAGING — CR DG CHEST 2V
2 series · 2 of 2 positions shown · non-contrast
Comparison: [DATE]

CLINICAL DATA: Back pain

CHEST - 2 VIEW

[view not recorded (1 of 2)]
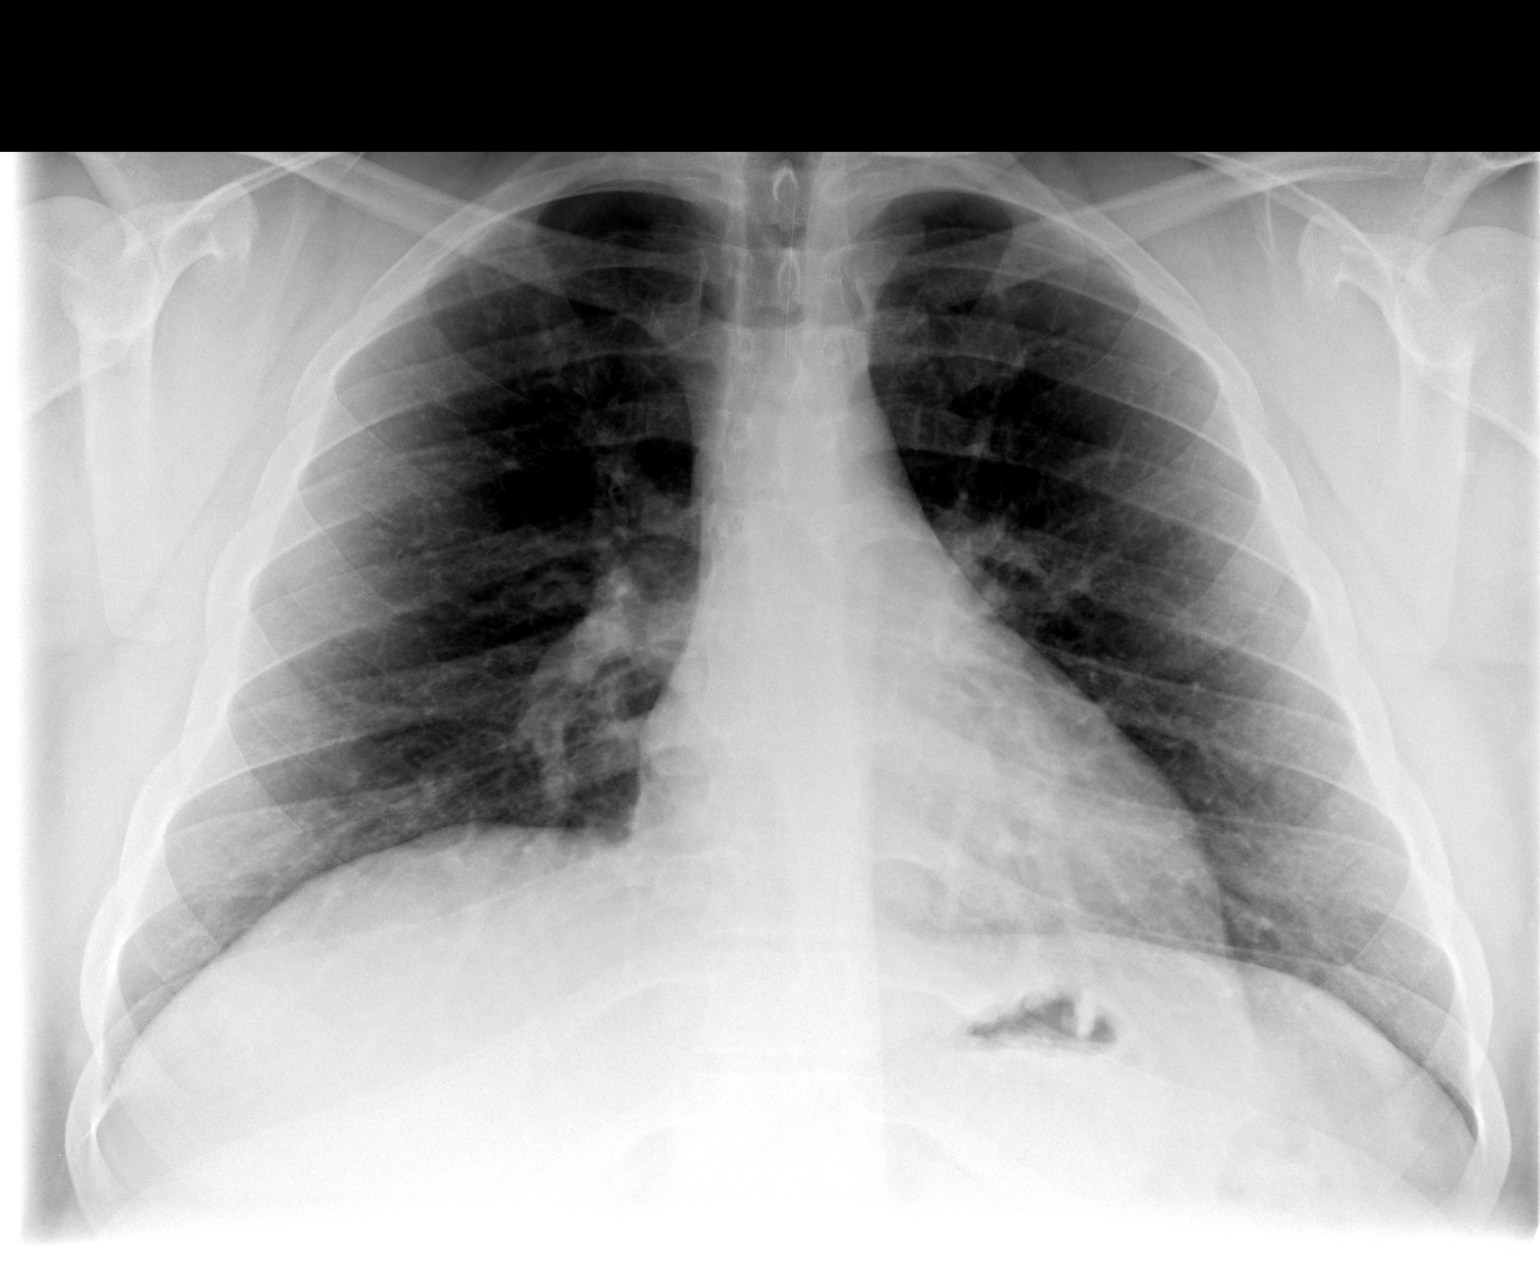

[view not recorded (2 of 2)]
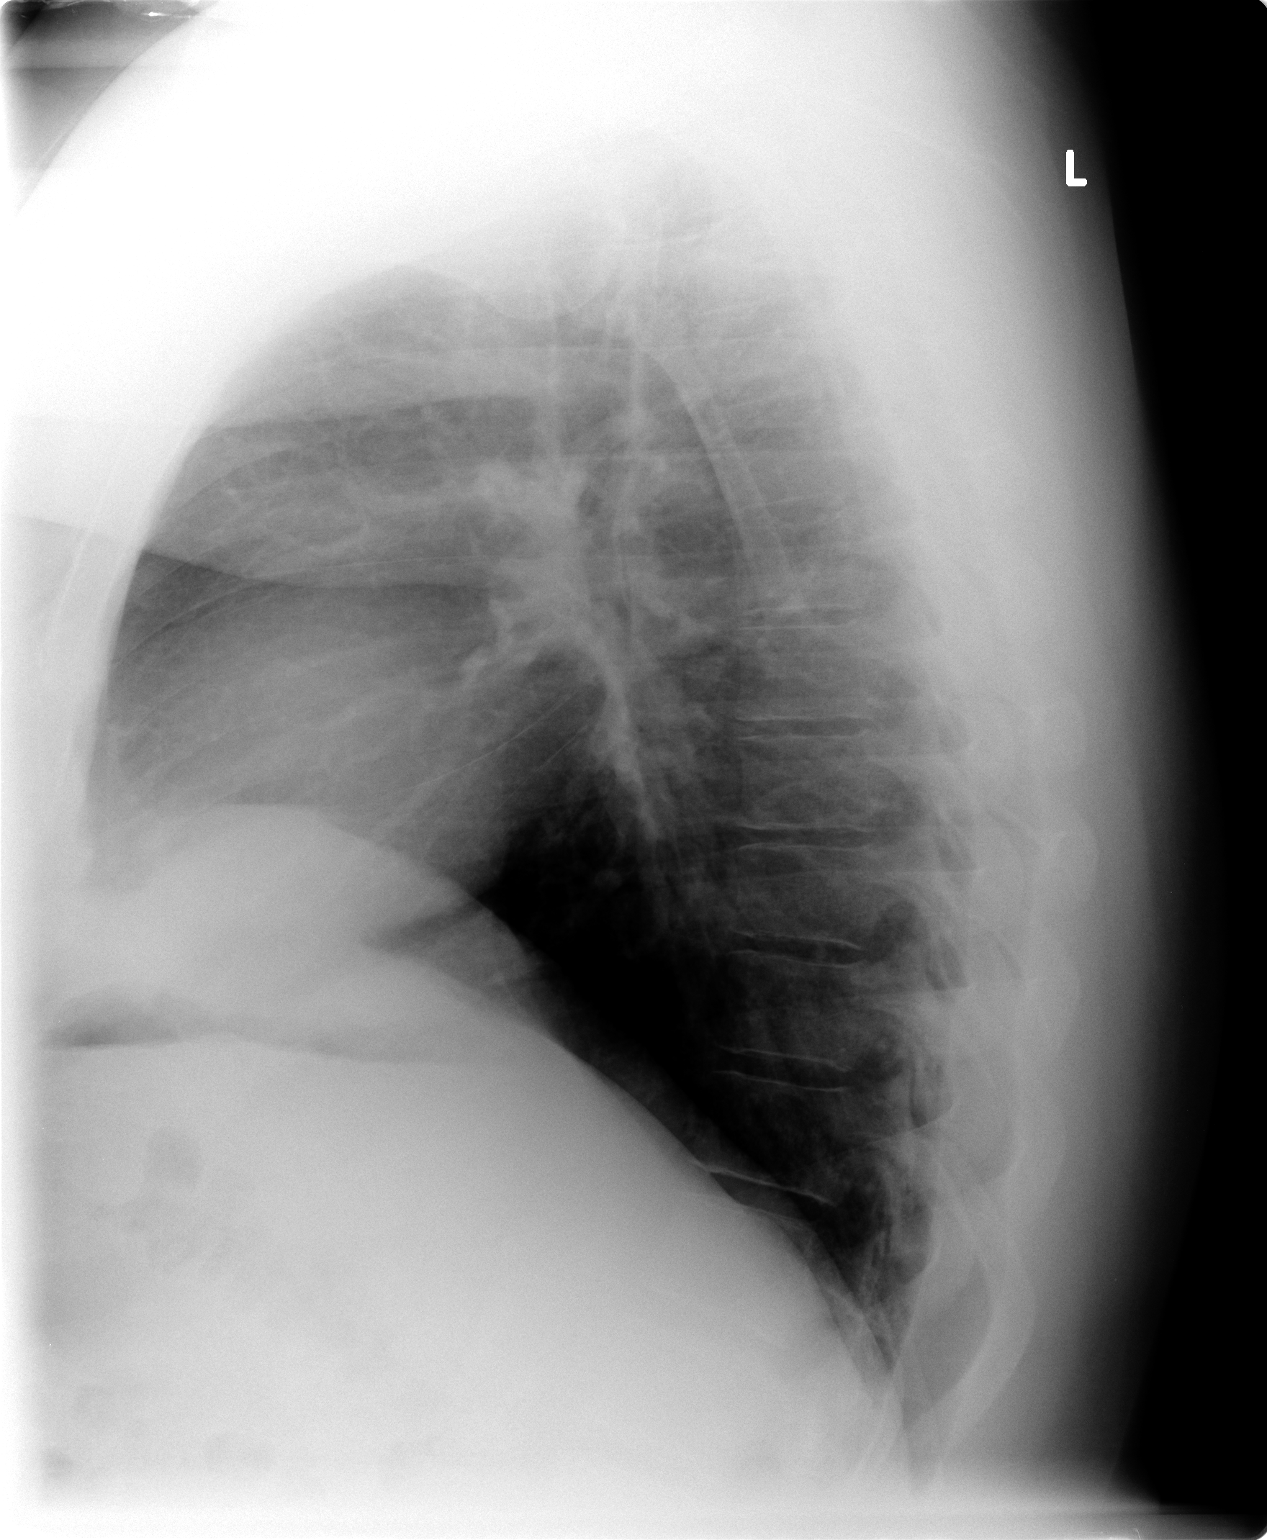

[2 of 2 positions shown; findings below may reference images not displayed]

FINDINGS: Heart size is normal.  Mediastinal shadows are normal.
Lungs are clear.  Previously seen lower lobe pneumonia has
resolved.  No effusions.  No bony abnormalities seen.
IMPRESSION: Normal chest

## 2010-12-01 ENCOUNTER — Encounter: Payer: Self-pay | Admitting: Emergency Medicine

## 2010-12-02 ENCOUNTER — Encounter: Payer: Self-pay | Admitting: Emergency Medicine

## 2010-12-10 ENCOUNTER — Emergency Department (HOSPITAL_COMMUNITY)
Admission: EM | Admit: 2010-12-10 | Discharge: 2010-12-10 | Payer: Self-pay | Source: Home / Self Care | Admitting: Emergency Medicine

## 2011-02-02 LAB — COMPREHENSIVE METABOLIC PANEL
Albumin: 4.1 g/dL (ref 3.5–5.2)
BUN: 14 mg/dL (ref 6–23)
CO2: 30 mEq/L (ref 19–32)
Chloride: 104 mEq/L (ref 96–112)
Creatinine, Ser: 1 mg/dL (ref 0.4–1.5)
Glucose, Bld: 100 mg/dL — ABNORMAL HIGH (ref 70–99)
Total Bilirubin: 0.3 mg/dL (ref 0.3–1.2)
Total Protein: 6.7 g/dL (ref 6.0–8.3)

## 2011-02-02 LAB — DIFFERENTIAL
Eosinophils Absolute: 0.2 10*3/uL (ref 0.0–0.7)
Eosinophils Relative: 2 % (ref 0–5)
Monocytes Absolute: 0.5 10*3/uL (ref 0.1–1.0)
Neutrophils Relative %: 68 % (ref 43–77)

## 2011-02-02 LAB — URINALYSIS, ROUTINE W REFLEX MICROSCOPIC
Bilirubin Urine: NEGATIVE
Glucose, UA: NEGATIVE mg/dL
Nitrite: NEGATIVE
Specific Gravity, Urine: >1.03 — ABNORMAL HIGH (ref 1.005–1.030)
pH: 6 (ref 5.0–8.0)

## 2011-02-02 LAB — CBC
Platelets: 218 10*3/uL (ref 150–400)
RDW: 12.3 % (ref 11.5–15.5)
WBC: 8.1 10*3/uL (ref 4.0–10.5)

## 2011-03-25 NOTE — Op Note (Signed)
NAME:  David Boyd, David Boyd               ACCOUNT NO.:  [id number]   MEDICAL RECORD NO.:  [id number]          PATIENT TYPE:  OBV   LOCATION:  A335                          FACILITY:  [facility]   PHYSICIAN:  Barbaraann Barthel, M.D. 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 Rigid proctoscopy to 25 cm.  2. Excision of pilonidal cyst.   SPECIMEN:  Pilonidal cyst detritus with sinus tract and clumps of  imbedded hair.   WOUND CLASSIFICATION:  Contaminated/infected.   Note, this is a 25 year old obese white male who presented with a  recurrent pilonidal abscess.  His past history is significant and that  he has had multiple procedures for this.  I personally operated on him  in April 2008; however, postoperatively he never came back to my office  for followup care.  He presented again with recurrent disease a couple  of days ago and we had plans for elective surgery after placing him on  antibiotics.   We discussed the procedure in detail discussing complications not  limited to, but including bleeding, infection, and recurrence and we  stressed the importance at this time that he follows up with [place] for  wound care.   GROSS OPERATIVE FINDINGS:  The patient had clumps of hair in the sinus  tract emanating laterally from the previous incision area that was  healed and scarred.   Prior to this, we had performed a proctoscopy, which was normal to 25 cm  with small internal hemorrhoids, the only findings.  There was no  perianal fistula in ano or fissures.   TECHNIQUE:  The patient was placed in the left lateral decubitus  position.  His buttocks were separated and after digital exam and  proctoscopy, we prepped with Betadine solution and draped in usual  manner.  I placed a probe in the  wound in order to remain and to  delineate the sinus tract, then I removed this entirely and found areas  of chronic granulation tissue with clumps of hair in it.  This was  removed and sent as a specimen.  We widely excised this and curetted  this.  We then irrigated with normal saline solution and achieved  hemostasis using the cautery device.  After checking for hemostasis, I  then irrigated again and then packed the wound open with 2-inch saline-  soaked Iodoform  gauze and the sterile dressing with an OpSite dressing was applied.  Prior to closure, all sponge, needle, and instrument counts were found  to be correct.  Estimated blood loss was less than 100 mL.  The patient  tolerated the procedure well and was taken to the recovery room in  satisfactory condition.      Barbaraann Barthel, M.D.  Electronically Signed     WB/MEDQ  D:  08/30/2008  T:  08/30/2008  Job:  [id number]

## 2011-03-28 NOTE — H&P (Signed)
   NAME:  LAUTARO, KORAL NO.:  [id number]   MEDICAL RECORD NO.:  [id number]                   PATIENT TYPE:  AMB   LOCATION:                                       FACILITY:  [facility]   PHYSICIAN:  Dalia Heading, M.D.               DATE OF BIRTH:  June 20, 1986   DATE OF ADMISSION:  05/17/2003  DATE OF DISCHARGE:                                HISTORY & PHYSICAL   CHIEF COMPLAINT:  Pilonidal cyst.   HISTORY OF PRESENT ILLNESS:  The patient is a 25 year old white male who is  referred for evaluation and treatment of a draining cyst over his coccyx.  It has been present for three months.  Intermittent purulent drainage is  noted.  It is tender to touch on occasion.   PAST MEDICAL HISTORY:  Asthma.   PAST SURGICAL HISTORY:  Unremarkable.   CURRENT MEDICATIONS:  Albuterol inhaler as needed.   ALLERGIES:  No known drug allergies.   REVIEW OF SYSTEMS:  Noncontributory.   PHYSICAL EXAMINATION:  GENERAL:  The patient is a well-developed, well-  nourished white male in no acute distress.  VITAL SIGNS:  He is afebrile and vital signs are stable.  LUNGS:  Clear to auscultation with equal breath sounds bilaterally.  HEART:  Regular rate and rhythm without S3, S4, or murmurs.  BACK:  A pilonidal cyst present.  No active drainage is noted.  Some  induration is present in the region.   IMPRESSION:  Pilonidal cyst.    PLAN:  The patient is scheduled for excision of the pilonidal cyst on May 17, 2003.  The risks and benefits of the procedure including bleeding,  infection, and recurrence of the cyst were fully explained to the patient's  mother, who gives informed consent for the patient, as the patient is a  minor.                                               Dalia Heading, M.D.    MAJ/MEDQ  D:  05/11/2003  T:  05/11/2003  Job:  [id number]   cc:   Madelin Rear. Sherwood Gambler, M.D.  [address]  [place]  [place] [address]  Fax: [phone]

## 2011-03-28 NOTE — Op Note (Signed)
NAME:  David Boyd, David Boyd NO.:  [id number]   MEDICAL RECORD NO.:  [id number]          PATIENT TYPE:  AMB   LOCATION:  DAY                           FACILITY:  [facility]   PHYSICIAN:  Barbaraann Barthel, M.D. DATE OF BIRTH:  1986-10-13   DATE OF PROCEDURE:  02/10/2007  DATE OF DISCHARGE:                               OPERATIVE REPORT   SURGEON:  Barbaraann Barthel, M.D.   PREOPERATIVE DIAGNOSIS:  Recurrent pilonidal cyst.   POSTOPERATIVE DIAGNOSIS:  Recurrent pilonidal cyst.   OPERATION/PROCEDURE:  1. Wide debridement and excision of pilonidal cyst.  2. Drainage of abscess and chronic sinus tracts and granulation      tissue.   SPECIMEN:  Debrided material.   INDICATIONS:  Note, this is a 24 year old white male who had undergone  two previous pilonidal cystectomies which were done in a closed fashion.  He had recurrence.  His last surgery was done in 2005.  He states that  he has had continuous drainage from sinus tracts in this area.  We  discussed repair with him, discussing the fact that this would have to  heal by secondary intention due to the amount of granulation tissue  there, and we also stated that recurrence is again the possibility.  Informed consent was obtained.   DESCRIPTION OF PROCEDURE:  The patient was placed in left lateral  decubitus position.  We prepped with Betadine solution and draped in the  usual manner and then did a wide excision around the area of the  draining sinus tract, placing opening this over probes in order to  remove all connecting sinus tissue areas.  We then curetted this on the  presacral fascia, removed as much chronic granulation tissue and sinus  tissue there. We then irrigated with normal saline solution to control  the bleeding on the cautery device and packed the wound open with a  rolled gauze  soaked in saline solution.  Sterile ABD pad was placed.  Prior to  closure all sponge, needle and instrument counts were found   to be  correct.  Estimated blood loss was minimal.  The patient received  crystalloids intraoperatively and cultures were obtained and sent as  well.      Barbaraann Barthel, M.D.  Electronically Signed     WB/MEDQ  D:  02/10/2007  T:  02/10/2007  Job:  [id number]   cc:   Mertha Finders., M.D.  Fax: [phone]

## 2011-03-28 NOTE — Op Note (Signed)
   NAME:  CLEMENCE, STILLINGS                         ACCOUNT NO.:  [id number]   MEDICAL RECORD NO.:  [id number]                   PATIENT TYPE:  AMB   LOCATION:  DAY                                  FACILITY:  [facility]   PHYSICIAN:  Dalia Heading, M.D.               DATE OF BIRTH:  [birth year]   DATE OF PROCEDURE:  05/17/2003  DATE OF DISCHARGE:                                 OPERATIVE REPORT   PREOPERATIVE DIAGNOSIS:  Pilonidal cyst.   POSTOPERATIVE DIAGNOSIS:  Pilonidal cyst.   PROCEDURE:  Excision of pilonidal cyst.   SURGEON:  Dalia Heading, M.D.   ANESTHESIA:  General endotracheal.   INDICATIONS:  The patient is a  25 year old white male who presents with  recurrent drainage from a pilonidal cyst.  The risks and benefits of the  procedure including bleeding, infection, and recurrence of the cyst were  fully explained to the patient's mother,  who gave informed consent for the  patient as the patient is a minor.   DESCRIPTION OF PROCEDURE:  The patient was placed in the right lateral  decubitus position after general endotracheal anesthesia was administered.  The coccyx are was prepped and draped using the usual sterile technique with  Betadine.  Surgical site confirmation was performed.   An elliptical incision was made around the pilonidal cyst region. This was  taken down to the subcutaneous tissue.  All cystic and granulomatous tissue  was excised without difficulty.  The wound was irrigated with normal saline.  The skin was closed using 3-0 nylon vertical mattress sutures.  Betadine  ointment and dry sterile dressings were applied.  Sensorcaine 0.5% was  instilled into the surrounding wound.   All tape and needle counts were correct at the end of the procedure.  The  patient was extubated in the operating room and went back to recovery room  in awake and stable condition.   COMPLICATIONS:  None.   SPECIMEN:  Pilonidal cyst.   BLOOD LOSS:  Minimal.                                     Dalia Heading, M.D.   MAJ/MEDQ  D:  05/17/2003  T:  05/18/2003  Job:  [id number]

## 2011-03-28 NOTE — H&P (Signed)
NAME:  David Boyd, David Boyd               ACCOUNT NO.:  [id number]   MEDICAL RECORD NO.:  [id number]          PATIENT TYPE:  AMB   LOCATION:                                 FACILITY:   PHYSICIAN:  Dalia Heading, M.D.  DATE OF BIRTH:  06/09/1986   DATE OF ADMISSION:  02/14/2005  DATE OF DISCHARGE:  LH                                HISTORY & PHYSICAL   CHIEF COMPLAINT:  Recurrent pilonidal cyst.   HISTORY OF PRESENT ILLNESS:  The patient is a 25 year old white male, status  post excision of pilonidal cyst in July 2004, who presents with a recurrent  infected pilonidal cyst just inferior to the surgical scar.  Despite  antibiotic therapy, he continues to have pain and swelling in this region.   PAST MEDICAL HISTORY:  1.  As noted above.  2.  Asthma.   PAST SURGICAL HISTORY:  As noted above.   CURRENT MEDICATIONS:  Albuterol inhaler p.r.n.   ALLERGIES:  HYDROCODONE.   REVIEW OF SYSTEMS:  The patient denies drinking or smoking.  Denies other  cardiopulmonary difficulties or bleeding disorders.   PHYSICAL EXAMINATION:  GENERAL:  The patient is a well-developed and well-  nourished white male, in no acute distress.  VITAL SIGNS:  He is afebrile.  Vital signs are stable.  LUNGS:  Clear to auscultation without wheezing noted.  HEART:  Examination reveals a regular rate and rhythm without S3 or S4 or  murmurs.  BACK:  Examination reveals a punctate wound just inferior to the surgical  scar over the coccyx.  It is tender with mild induration present.  No  drainage is noted.   IMPRESSION:  Recurrent pilonidal cyst.   PLAN:  The patient is scheduled for an excision of the pilonidal cyst on  February 14, 2005.  The risks and benefits of the procedure, including bleeding,  infection and recurrence of the pilonidal cyst were fully explained to the  patient, who gave an informed consent.      MAJ/MEDQ  D:  02/11/2005  T:  02/11/2005  Job:  [id number]   cc:   Madelin Rear. Sherwood Gambler, MD  [address]  [place]  [place] [address]  Fax: [phone]

## 2011-03-28 NOTE — Op Note (Signed)
NAME:  David Boyd, David Boyd               ACCOUNT NO.:  [id number]   MEDICAL RECORD NO.:  [id number]          PATIENT TYPE:  AMB   LOCATION:  DAY                           FACILITY:  [facility]   PHYSICIAN:  Dalia Heading, M.D.  DATE OF BIRTH:  10/02/1986   DATE OF PROCEDURE:  02/14/2005  DATE OF DISCHARGE:                                 OPERATIVE REPORT   PREOPERATIVE DIAGNOSIS:  Recurrent pilonidal cyst.   POSTOPERATIVE DIAGNOSIS:  Recurrent pilonidal cyst.   PROCEDURE:  Excision of pilonidal cyst.   SURGEON:  Dalia Heading, M.D.   ANESTHESIA:  General endotracheal.   INDICATIONS:  The patient is a 25 year old white male status post excision  of pilonidal cyst in July 2004, who presents with a recurrent infected  pilonidal cyst just inferior to the previous surgical scar.  The risks and  benefits of the procedure including bleeding, infection, and recurrence of  the pilonidal cyst, were fully explained to the patient, who gave informed  consent.   PROCEDURE NOTE:  The patient was placed in the right lateral decubitus  position after induction of general endotracheal anesthesia.  The pilonidal  region was prepped and draped using the usual sterile technique with  Betadine.  Surgical site confirmation was performed.   A small pilonidal cyst was noted just inferior to the midline surgical scar.  A elliptical incision was made and the pilonidal cyst was excised without  difficulty.  The specimen was sent to pathology for further examination.  Any bleeding was controlled using Bovie electrocautery.  The skin was  instilled with 0.5% Sensorcaine.  The incision was reapproximated using 3-0  nylon vertical mattress sutures.  Betadine ointment and a dry sterile  dressing were applied.   All tape and needle counts were correct at the end of the procedure.  The  patient was extubated in the operating room and went back to the recovery  room awake, in stable condition.   COMPLICATIONS:   None.   SPECIMEN:  Pilonidal cyst.   BLOOD LOSS:  Minimal.      MAJ/MEDQ  D:  02/14/2005  T:  02/14/2005  Job:  [id number]   cc:   Madelin Rear. Sherwood Gambler, MD  [address]  [place]  [place] [address]  Fax: [phone]

## 2011-04-05 ENCOUNTER — Emergency Department (HOSPITAL_COMMUNITY)
Admission: EM | Admit: 2011-04-05 | Discharge: 2011-04-05 | Disposition: A | Discharge status: Home / Self Care | Payer: Self-pay | Attending: Emergency Medicine | Admitting: Emergency Medicine

## 2011-04-05 DIAGNOSIS — R109 Unspecified abdominal pain: Secondary | ICD-10-CM | POA: Insufficient documentation

## 2011-05-04 ENCOUNTER — Emergency Department (HOSPITAL_COMMUNITY)
Admission: EM | Admit: 2011-05-04 | Discharge: 2011-05-04 | Disposition: A | Discharge status: Home / Self Care | Payer: Self-pay | Attending: Emergency Medicine | Admitting: Emergency Medicine

## 2011-05-04 DIAGNOSIS — H5710 Ocular pain, unspecified eye: Secondary | ICD-10-CM | POA: Insufficient documentation

## 2011-05-04 DIAGNOSIS — H5789 Other specified disorders of eye and adnexa: Secondary | ICD-10-CM | POA: Insufficient documentation

## 2011-05-04 DIAGNOSIS — H16139 Photokeratitis, unspecified eye: Secondary | ICD-10-CM | POA: Insufficient documentation

## 2011-06-01 ENCOUNTER — Encounter: Payer: Self-pay | Admitting: *Deleted

## 2011-06-01 ENCOUNTER — Emergency Department (HOSPITAL_COMMUNITY)
Admission: EM | Admit: 2011-06-01 | Discharge: 2011-06-01 | Disposition: A | Discharge status: Home / Self Care | Payer: Self-pay | Attending: Emergency Medicine | Admitting: Emergency Medicine

## 2011-06-01 DIAGNOSIS — X58XXXA Exposure to other specified factors, initial encounter: Secondary | ICD-10-CM | POA: Insufficient documentation

## 2011-06-01 DIAGNOSIS — T148XXA Other injury of unspecified body region, initial encounter: Secondary | ICD-10-CM | POA: Insufficient documentation

## 2011-06-01 LAB — URINALYSIS, ROUTINE W REFLEX MICROSCOPIC
Bilirubin Urine: NEGATIVE
Hgb urine dipstick: NEGATIVE
Leukocytes, UA: NEGATIVE
Protein, ur: NEGATIVE mg/dL
Urobilinogen, UA: 0.2 mg/dL (ref 0.0–1.0)
pH: 5.5 (ref 5.0–8.0)

## 2011-06-01 MED ORDER — HYDROCODONE-ACETAMINOPHEN 5-325 MG PO TABS
2.0000 | ORAL_TABLET | Freq: Once | ORAL | Status: AC
  Administered 2011-06-01: 2 via ORAL
  Filled 2011-06-01: qty 2

## 2011-06-01 MED ORDER — METHOCARBAMOL 500 MG PO TABS
1000.0000 mg | ORAL_TABLET | Freq: Once | ORAL | Status: AC
  Administered 2011-06-01: 1000 mg via ORAL
  Filled 2011-06-01: qty 2

## 2011-06-01 MED ORDER — HYDROCODONE-ACETAMINOPHEN 5-325 MG PO TABS: ORAL_TABLET | ORAL | Status: DC

## 2011-06-01 MED ORDER — METHOCARBAMOL 500 MG PO TABS: ORAL_TABLET | ORAL | Status: DC

## 2011-06-01 NOTE — ED Notes (Signed)
Left side pain with nausea.  Denies v/d/gu sx.

## 2011-06-01 NOTE — ED Provider Notes (Signed)
History     Chief Complaint  Patient presents with  . left side pain    Patient is a 25 y.o. male presenting with flank pain. The history is provided by the patient.  Flank Pain This is a new problem. The current episode started in the past 7 days. The problem occurs daily. The problem has been unchanged. Associated symptoms include chills and nausea. Pertinent negatives include no abdominal pain, arthralgias, chest pain, coughing, fever or neck pain. The symptoms are aggravated by nothing. He has tried rest for the symptoms.    History reviewed. No pertinent past medical history.  History reviewed. No pertinent past surgical history.  No family history on file.  History  Substance Use Topics  . Smoking status: Never Smoker   . Smokeless tobacco: Not on file  . Alcohol Use: Yes     occasional      Review of Systems  Constitutional: Positive for chills. Negative for fever and activity change.       All ROS Neg except as noted in HPI  HENT: Negative for nosebleeds and neck pain.   Eyes: Negative for photophobia and discharge.  Respiratory: Negative for cough, shortness of breath and wheezing.   Cardiovascular: Negative for chest pain and palpitations.  Gastrointestinal: Positive for nausea. Negative for abdominal pain and blood in stool.  Genitourinary: Positive for flank pain. Negative for dysuria, frequency and hematuria.  Musculoskeletal: Negative for back pain and arthralgias.  Skin: Negative.   Neurological: Negative for dizziness, seizures and speech difficulty.  Psychiatric/Behavioral: Negative for hallucinations and confusion.    Physical Exam  BP 131/106  Pulse 72  Temp(Src) 98.7 F (37.1 C) (Oral)  Resp 18  Ht 5\' 11"  (1.803 m)  Wt 280 lb (127.007 kg)  BMI 39.05 kg/m2  SpO2 100%  Physical Exam  Nursing note and vitals reviewed. Constitutional: He is oriented to person, place, and time. He appears well-developed and well-nourished.  Non-toxic appearance.    HENT:  Head: Normocephalic.  Right Ear: Tympanic membrane and external ear normal.  Left Ear: Tympanic membrane and external ear normal.  Eyes: EOM and lids are normal. Pupils are equal, round, and reactive to light.  Neck: Normal range of motion. Neck supple. Carotid bruit is not present.  Cardiovascular: Normal rate, regular rhythm, normal heart sounds, intact distal pulses and normal pulses.   Pulmonary/Chest: Breath sounds normal. Not tachypneic. No respiratory distress. Chest wall is not dull to percussion. He exhibits tenderness. He exhibits no crepitus.  Abdominal: Soft. Bowel sounds are normal. There is no tenderness. There is no guarding.  Musculoskeletal: Normal range of motion.  Lymphadenopathy:       Head (right side): No submandibular adenopathy present.       Head (left side): No submandibular adenopathy present.    He has no cervical adenopathy.  Neurological: He is alert and oriented to person, place, and time. He has normal strength. No cranial nerve deficit or sensory deficit.  Skin: Skin is warm and dry.  Psychiatric: He has a normal mood and affect. His speech is normal.    ED Course  Procedures  MDM I have reviewed nursing notes, vital signs, and all appropriate lab and imaging results for this patient.      Kathie Dike, [place] 06/01/11 [phone]

## 2011-06-02 NOTE — ED Provider Notes (Signed)
Evaluation and management procedures were performed by the mid-level provider (PA/NP/CNM) under my supervision/collaboration.   Vida Roller, MD 06/02/11 0900

## 2011-08-11 LAB — BASIC METABOLIC PANEL
BUN: 14
CO2: 28
Calcium: 9.5
Glucose, Bld: 110 — ABNORMAL HIGH
Sodium: 138

## 2011-08-11 LAB — CBC
Hemoglobin: 16.5
MCHC: 35.1
RDW: 12.8

## 2011-08-11 LAB — DIFFERENTIAL
Basophils Absolute: 0
Basophils Relative: 0
Eosinophils Relative: 3
Monocytes Absolute: 0.3
Neutro Abs: 3.7

## 2011-08-13 LAB — CULTURE, ROUTINE-ABSCESS

## 2011-10-24 ENCOUNTER — Emergency Department (HOSPITAL_COMMUNITY)
Admission: EM | Admit: 2011-10-24 | Discharge: 2011-10-24 | Discharge status: Against Medical Advice | Payer: Self-pay | Attending: Emergency Medicine | Admitting: Emergency Medicine

## 2011-10-24 ENCOUNTER — Encounter (HOSPITAL_COMMUNITY): Payer: Self-pay

## 2011-10-24 DIAGNOSIS — R51 Headache: Secondary | ICD-10-CM | POA: Insufficient documentation

## 2011-10-24 DIAGNOSIS — R509 Fever, unspecified: Secondary | ICD-10-CM | POA: Insufficient documentation

## 2011-10-24 DIAGNOSIS — R109 Unspecified abdominal pain: Secondary | ICD-10-CM | POA: Insufficient documentation

## 2011-10-24 DIAGNOSIS — R110 Nausea: Secondary | ICD-10-CM | POA: Insufficient documentation

## 2011-10-24 NOTE — ED Notes (Signed)
Pt presents with abdominal pain, nausea, fever, and headache starting today.

## 2011-10-24 NOTE — ED Notes (Signed)
Pt seen by front desk walking out front doors. Pt did not sign AMA.

## 2012-02-17 ENCOUNTER — Encounter (HOSPITAL_COMMUNITY): Payer: Self-pay | Admitting: *Deleted

## 2012-02-17 ENCOUNTER — Emergency Department (HOSPITAL_COMMUNITY)
Admission: EM | Admit: 2012-02-17 | Discharge: 2012-02-17 | Disposition: A | Discharge status: Home / Self Care | Payer: BC Managed Care – PPO | Attending: Emergency Medicine | Admitting: Emergency Medicine

## 2012-02-17 ENCOUNTER — Emergency Department (HOSPITAL_COMMUNITY): Payer: BC Managed Care – PPO

## 2012-02-17 DIAGNOSIS — X500XXA Overexertion from strenuous movement or load, initial encounter: Secondary | ICD-10-CM | POA: Insufficient documentation

## 2012-02-17 DIAGNOSIS — S93401A Sprain of unspecified ligament of right ankle, initial encounter: Secondary | ICD-10-CM

## 2012-02-17 DIAGNOSIS — S93409A Sprain of unspecified ligament of unspecified ankle, initial encounter: Secondary | ICD-10-CM | POA: Insufficient documentation

## 2012-02-17 IMAGING — CR DG ANKLE COMPLETE 3+V*R*
3 series · 3 of 3 positions shown · non-contrast
Comparison: None.

CLINICAL DATA: Right ankle pain.

RIGHT ANKLE - COMPLETE 3+ VIEW

[view not recorded (1 of 3)]
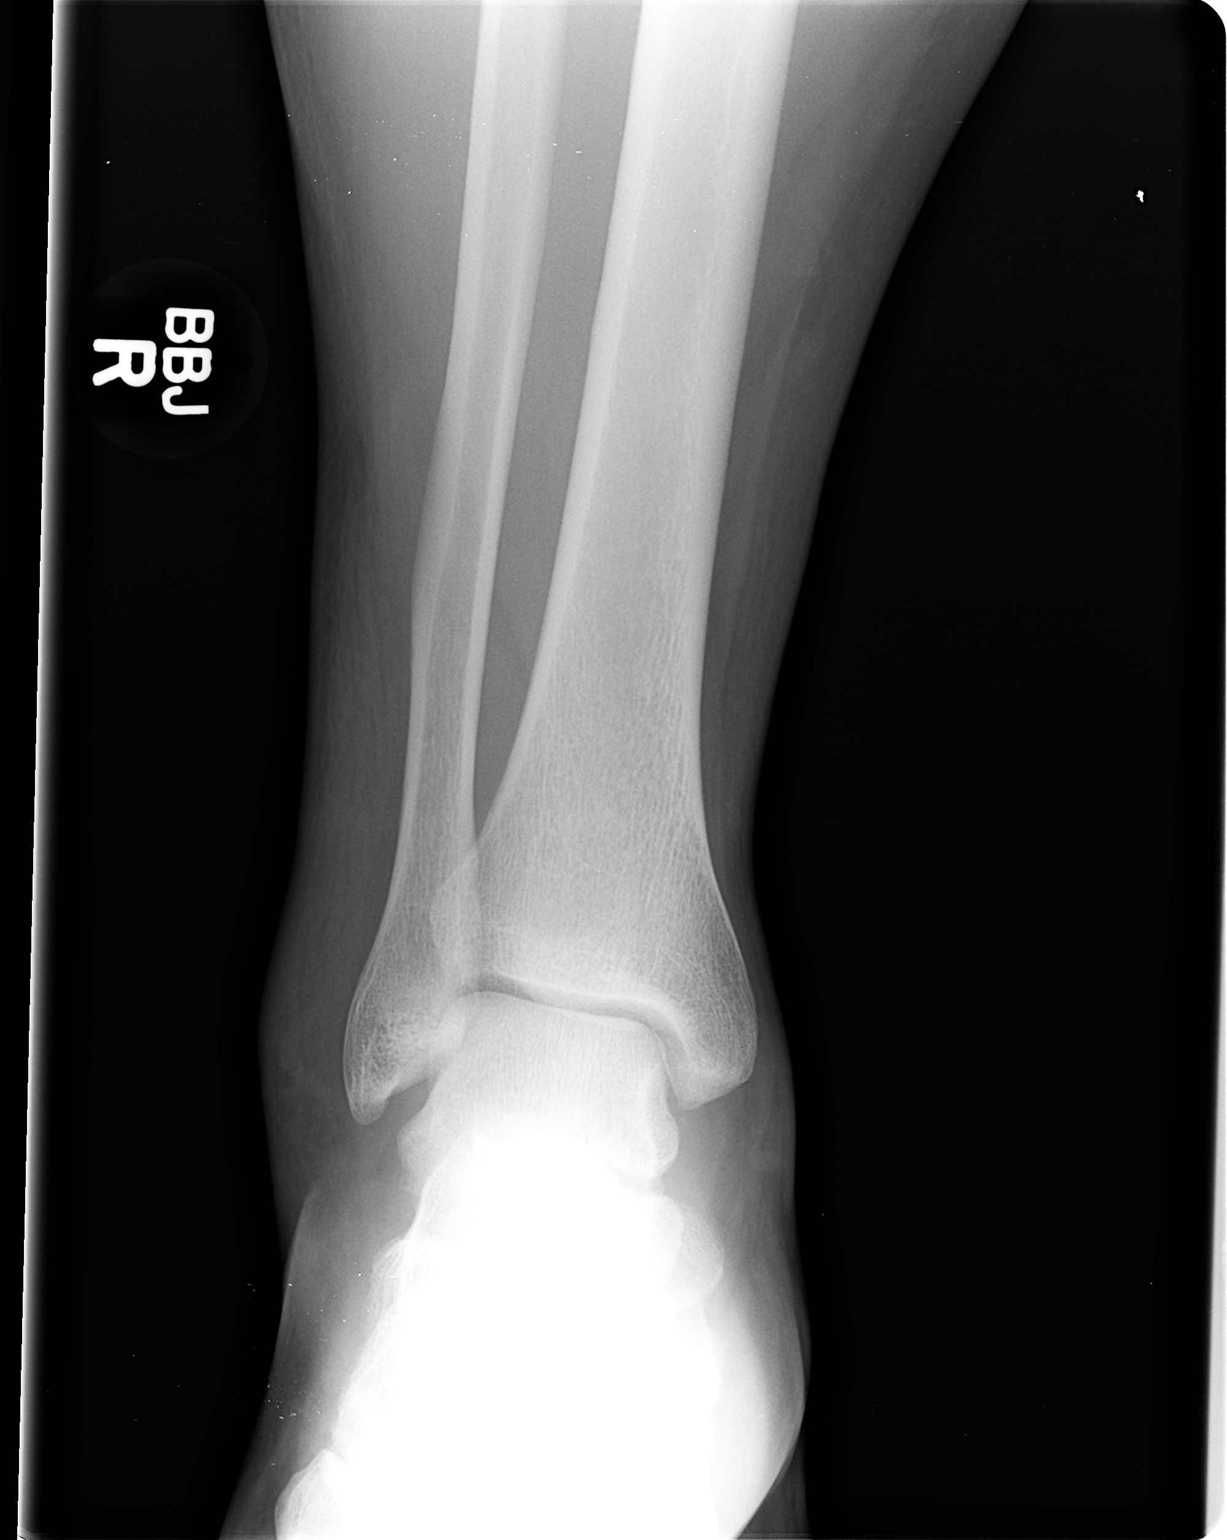

[view not recorded (2 of 3)]
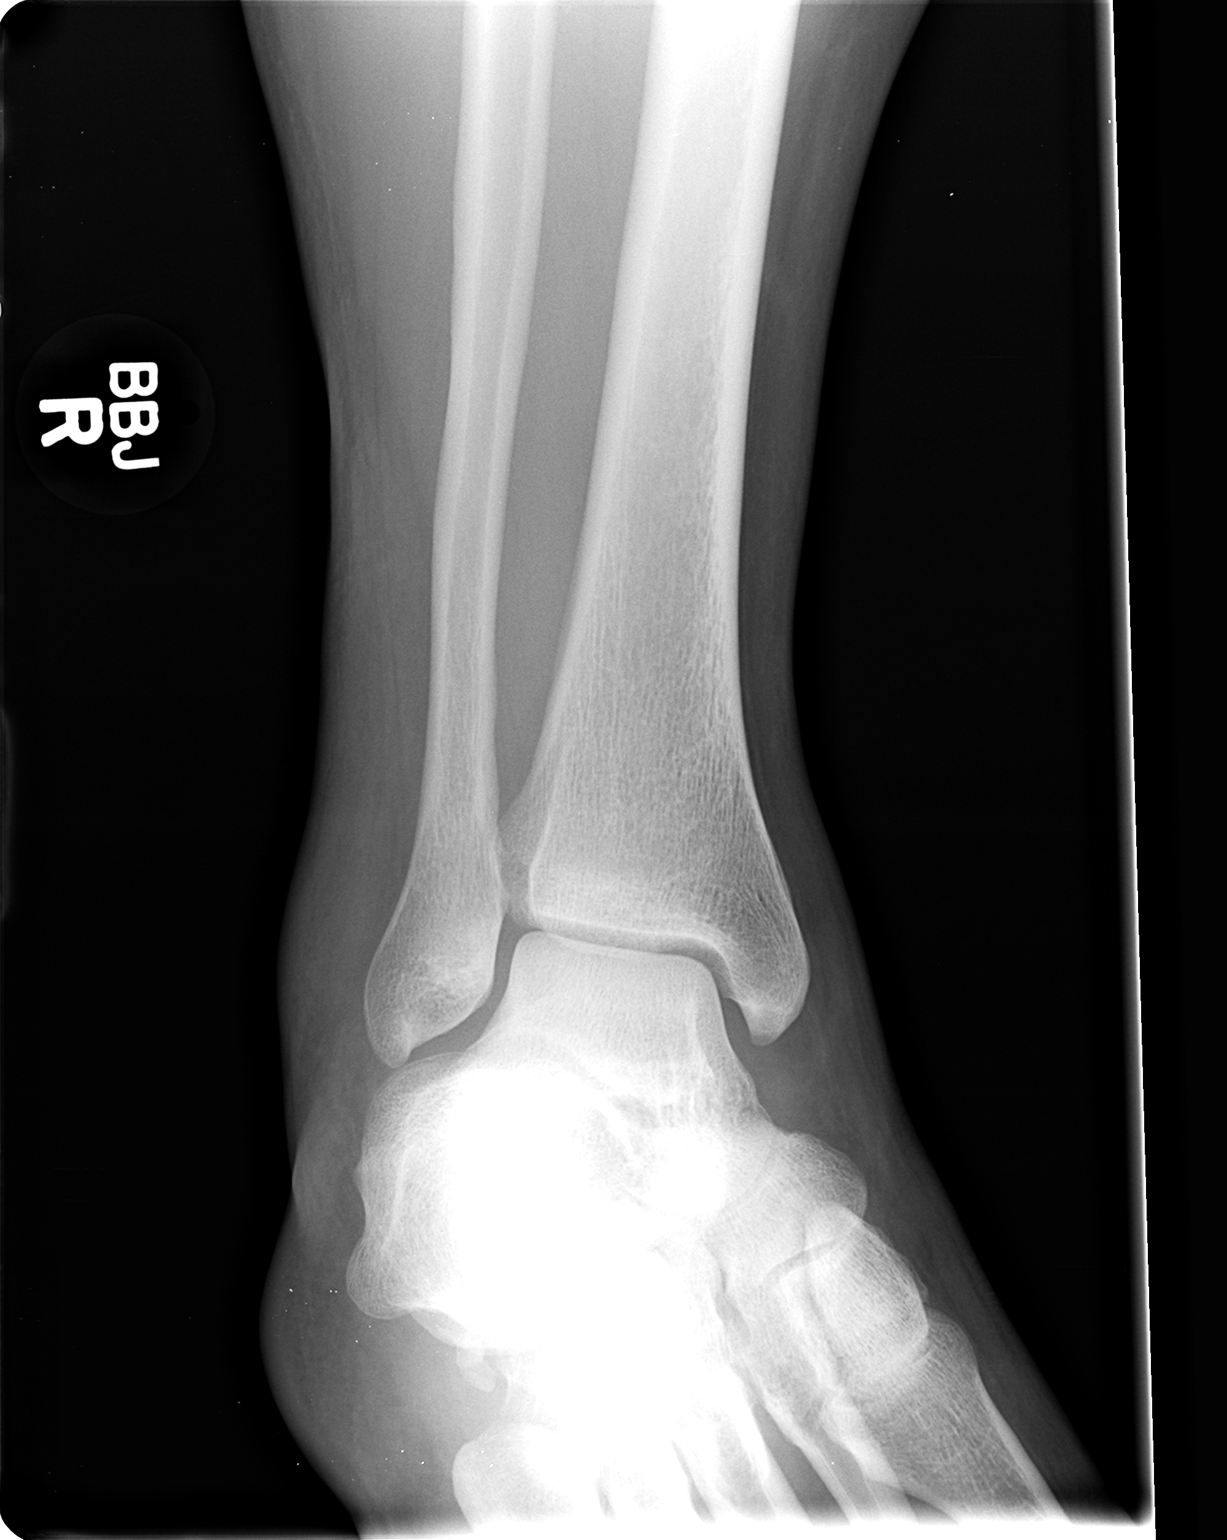

[view not recorded (3 of 3)]
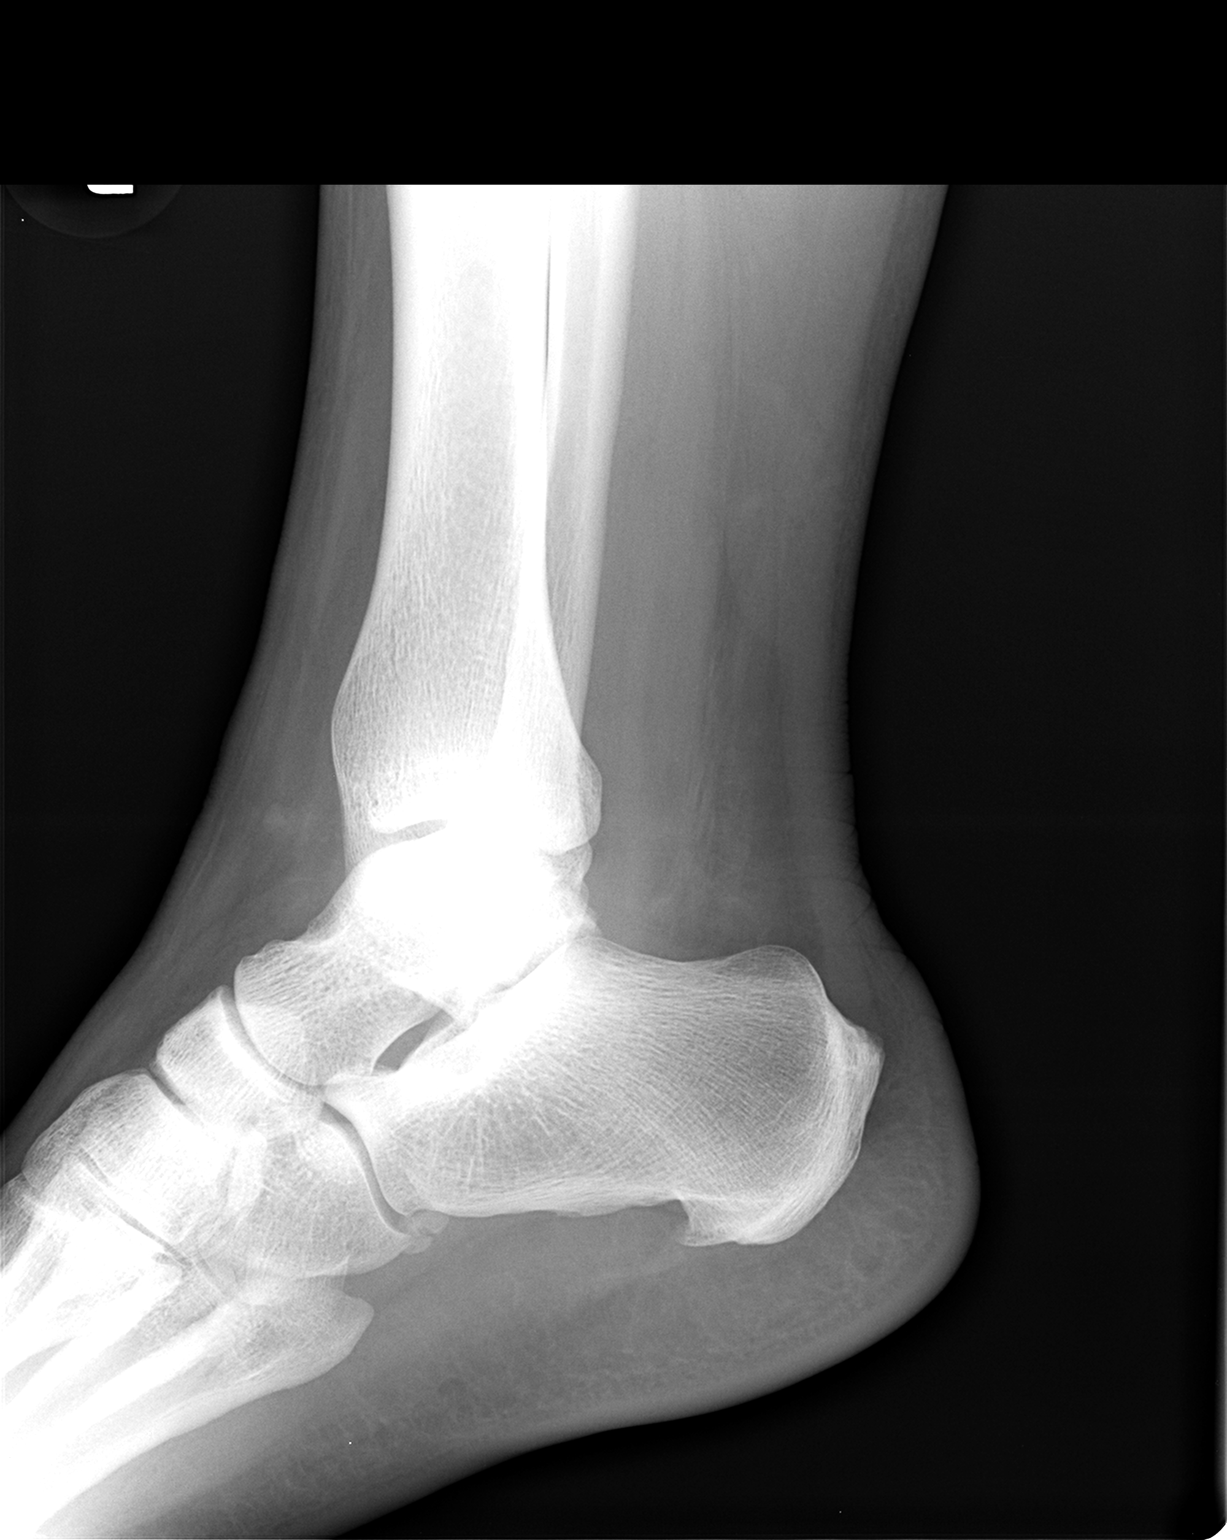

[3 of 3 positions shown; findings below may reference images not displayed]

FINDINGS: Soft tissue swelling along the lateral malleolus.  The
right ankle is located without acute fracture.  Small spur
involving the plantar aspect of the calcaneus.
IMPRESSION: Lateral soft tissue swelling.  No acute fracture or dislocation.

## 2012-02-17 MED ORDER — HYDROCODONE-ACETAMINOPHEN 5-325 MG PO TABS: ORAL_TABLET | ORAL | Status: DC

## 2012-02-17 NOTE — ED Provider Notes (Signed)
History     CSN: [id number]  Arrival date & time 02/17/12  2008   First MD Initiated Contact with Patient 02/17/12 2035      Chief Complaint  Patient presents with  . Ankle Injury    (Consider location/radiation/quality/duration/timing/severity/associated sxs/prior treatment) HPI Comments: Stepped in a hole and inverted R ankle working today.  Patient is a 26 y.o. male presenting with lower extremity injury. The history is provided by the patient. No language interpreter was used.  Ankle Injury This is a new problem. The current episode started today. The problem occurs constantly. The problem has been gradually worsening. The symptoms are aggravated by walking and standing. He has tried nothing for the symptoms.    History reviewed. No pertinent past medical history.  History reviewed. No pertinent past surgical history.  History reviewed. No pertinent family history.  History  Substance Use Topics  . Smoking status: Never Smoker   . Smokeless tobacco: Not on file  . Alcohol Use: Yes     occasional      Review of Systems  Musculoskeletal:       Ankle injury  All other systems reviewed and are negative.    Allergies  Review of patient's allergies indicates no known allergies.  Home Medications   Current Outpatient Rx  Name Route Sig Dispense Refill  . IBUPROFEN 200 MG PO TABS Oral Take 600 mg by mouth as needed. For pain    . HYDROCODONE-ACETAMINOPHEN 5-325 MG PO TABS  One tab po q 4-6 hrs prn pain 20 tablet 0    BP 137/85  Pulse 111  Temp(Src) 97.9 F (36.6 C) (Oral)  Resp 20  Ht 5\' 11"  (1.803 m)  Wt 280 lb (127.007 kg)  BMI 39.05 kg/m2  SpO2 97%  Physical Exam  Nursing note and vitals reviewed. Constitutional: He is oriented to person, place, and time. He appears well-developed and well-nourished.  HENT:  Head: Normocephalic and atraumatic.  Eyes: EOM are normal.  Neck: Normal range of motion.  Cardiovascular: Normal rate, regular rhythm,  normal heart sounds and intact distal pulses.   Pulmonary/Chest: Effort normal and breath sounds normal. No respiratory distress.  Abdominal: Soft. He exhibits no distension. There is no tenderness.  Musculoskeletal: He exhibits tenderness.       Right ankle: He exhibits decreased range of motion and swelling. He exhibits no ecchymosis, no deformity, no laceration and normal pulse. tenderness. Lateral malleolus tenderness found.       Feet:  Neurological: He is alert and oriented to person, place, and time.  Skin: Skin is warm and dry.  Psychiatric: He has a normal mood and affect. Judgment normal.    ED Course  Procedures (including critical care time)  Labs Reviewed - No data to display Dg Ankle Complete Right  02/17/2012  *RADIOLOGY REPORT*  Clinical Data: Right ankle pain.  RIGHT ANKLE - COMPLETE 3+ VIEW  Comparison: None.  Findings: Soft tissue swelling along the lateral malleolus.  The right ankle is located without acute fracture.  Small spur involving the plantar aspect of the calcaneus.  IMPRESSION: Lateral soft tissue swelling.  No acute fracture or dislocation.  Original Report Authenticated By: Richarda Overlie, M.D.     1. Right ankle sprain       MDM  rx-hydrocodone, 20 OTC ibuprofen 800 mg TID ASO x 2-3 weeks Crutches prn F/u with dr. Tera Mater elevation        Worthy Rancher, PA 02/17/12 2244  Worthy Rancher,  PA 02/17/12 2248

## 2012-02-17 NOTE — Discharge Instructions (Signed)
Ankle Sprain An ankle sprain is an injury to the strong, fibrous tissues (ligaments) that hold the bones of your ankle joint together.  CAUSES Ankle sprain usually is caused by a fall or by twisting your ankle. People who participate in sports are more prone to these types of injuries.  SYMPTOMS  Symptoms of ankle sprain include:  Pain in your ankle. The pain may be present at rest or only when you are trying to stand or walk.   Swelling.   Bruising. Bruising may develop immediately or within 1 to 2 days after your injury.   Difficulty standing or walking.  DIAGNOSIS  Your caregiver will ask you details about your injury and perform a physical exam of your ankle to determine if you have an ankle sprain. During the physical exam, your caregiver will press and squeeze specific areas of your foot and ankle. Your caregiver will try to move your ankle in certain ways. An X-ray exam may be done to be sure a bone was not broken or a ligament did not separate from one of the bones in your ankle (avulsion).  TREATMENT  Certain types of braces can help stabilize your ankle. Your caregiver can make a recommendation for this. Your caregiver may recommend the use of medication for pain. If your sprain is severe, your caregiver may refer you to a surgeon who helps to restore function to parts of your skeletal system (orthopedist) or a physical therapist. HOME CARE INSTRUCTIONS  Apply ice to your injury for 1 to 2 days or as directed by your caregiver. Applying ice helps to reduce inflammation and pain.  Put ice in a plastic bag.   Place a towel between your skin and the bag.   Leave the ice on for 15 to 20 minutes at a time, every 2 hours while you are awake.   Take over-the-counter or prescription medicines for pain, discomfort, or fever only as directed by your caregiver.   Keep your injured leg elevated, when possible, to lessen swelling.   If your caregiver recommends crutches, use them as  instructed. Gradually, put weight on the affected ankle. Continue to use crutches or a cane until you can walk without feeling pain in your ankle.   If you have a plaster splint, wear the splint as directed by your caregiver. Do not rest it on anything harder than a pillow the first 24 hours. Do not put weight on it. Do not get it wet. You may take it off to take a shower or bath.   You may have been given an elastic bandage to wear around your ankle to provide support. If the elastic bandage is too tight (you have numbness or tingling in your foot or your foot becomes cold and blue), adjust the bandage to make it comfortable.   If you have an air splint, you may blow more air into it or let air out to make it more comfortable. You may take your splint off at night and before taking a shower or bath.   Wiggle your toes in the splint several times per day if you are able.  SEEK MEDICAL CARE IF:   You have an increase in bruising, swelling, or pain.   Your toes feel cold.   Pain relief is not achieved with medication.  SEEK IMMEDIATE MEDICAL CARE IF: Your toes are numb or blue or you have severe pain. MAKE SURE YOU:   Understand these instructions.   Will watch your condition.     Will get help right away if you are not doing well or get worse.  Document Released: 10/27/2005 Document Revised: 10/16/2011 Document Reviewed: 05/31/2008 [facility] Patient Information 2012 [place], [place].Crutch Use You have been prescribed crutches to take weight off one of your lower legs or feet (extremities). When using crutches, make sure you are not putting pressure on the armpit (axilla). This could cause damage to the nerves that extend from your axilla to the hand and arm. When fitted properly the crutches should be 2 to 3 finger widths below the axilla. Your weight should be supported by your hand, and not by resting upon the crutch with the axilla. When walking, first step with the crutches, then swing  the healthy leg through and slightly ahead. When going up stairs, first step up with the healthy leg and then follow with the crutches and injured leg up to the same step, and so forth. If there is a handrail, hold both crutches in one hand, place your other hand on the handrail, and while placing your weight on your arms, lift your good leg to the step, then bring the crutches and the injured leg up to that step. Repeat for each step. When going down stairs, first step with the injured leg and crutches, following down with the healthy leg to the same step. Be very careful, as going down stairs with crutches is very challenging. If you feel wobbly or nervous, sit down and inch yourself down the stairs on your butt. To get up from a chair, hold injured leg forward, grab armrest with one hand and the top of the crutches with the other hand. Using these supports, pull yourself up to a standing position. Reverse this procedure for sitting. See your caregiver for follow up as suggested. If you are discharged in an ace wrap and develop numbness, tingling, swelling, or increased pain, loosen the ace wrap and re-wrap looser. If these problems persist, see your caregiver as needed. If you have been instructed to use partial weight bearing, bear (apply) the amount of weight as suggested by your caregiver. Do not bear weight in an amount that causes pain on the area of injury. Document Released: 10/24/2000 Document Revised: 10/16/2011 Document Reviewed: 01/01/2009 [facility] Patient Information 2012 [place], [place].Cryotherapy Cryotherapy means treatment with cold. Ice or gel packs can be used to reduce both pain and swelling. Ice is the most helpful within the first 24 to 48 hours after an injury or flareup from overusing a muscle or joint. Sprains, strains, spasms, burning pain, shooting pain, and aches can all be eased with ice. Ice can also be used when recovering from surgery. Ice is effective, has very few side  effects, and is safe for most people to use. PRECAUTIONS  Ice is not a safe treatment option for people with:  Raynaud's phenomenon. This is a condition affecting small blood vessels in the extremities. Exposure to cold may cause your problems to return.   Cold hypersensitivity. There are many forms of cold hypersensitivity, including:   Cold urticaria. Red, itchy hives appear on the skin when the tissues begin to warm after being iced.   Cold erythema. This is a red, itchy rash caused by exposure to cold.   Cold hemoglobinuria. Red blood cells break down when the tissues begin to warm after being iced. The hemoglobin that carry oxygen are passed into the urine because they cannot combine with blood proteins fast enough.   Numbness or altered sensitivity in the area being iced.  If you have any of the following conditions, do not use ice until you have discussed cryotherapy with your caregiver:  Heart conditions, such as arrhythmia, angina, or chronic heart disease.   High blood pressure.   Healing wounds or open skin in the area being iced.   Current infections.   Rheumatoid arthritis.   Poor circulation.   Diabetes.  Ice slows the blood flow in the region it is applied. This is beneficial when trying to stop inflamed tissues from spreading irritating chemicals to surrounding tissues. However, if you expose your skin to cold temperatures for too long or without the proper protection, you can damage your skin or nerves. Watch for signs of skin damage due to cold. HOME CARE INSTRUCTIONS Follow these tips to use ice and cold packs safely.  Place a dry or damp towel between the ice and skin. A damp towel will cool the skin more quickly, so you may need to shorten the time that the ice is used.   For a more rapid response, add gentle compression to the ice.   Ice for no more than 10 to 20 minutes at a time. The bonier the area you are icing, the less time it will take to get the  benefits of ice.   Check your skin after 5 minutes to make sure there are no signs of a poor response to cold or skin damage.   Rest 20 minutes or more in between uses.   Once your skin is numb, you can end your treatment. You can test numbness by very lightly touching your skin. The touch should be so light that you do not see the skin dimple from the pressure of your fingertip. When using ice, most people will feel these normal sensations in this order: cold, burning, aching, and numbness.   Do not use ice on someone who cannot communicate their responses to pain, such as small children or people with dementia.  HOW TO MAKE AN ICE PACK Ice packs are the most common way to use ice therapy. Other methods include ice massage, ice baths, and cryo-sprays. Muscle creams that cause a cold, tingly feeling do not offer the same benefits that ice offers and should not be used as a substitute unless recommended by your caregiver. To make an ice pack, do one of the following:  Place crushed ice or a bag of frozen vegetables in a sealable plastic bag. Squeeze out the excess air. Place this bag inside another plastic bag. Slide the bag into a pillowcase or place a damp towel between your skin and the bag.   Mix 3 parts water with 1 part rubbing alcohol. Freeze the mixture in a sealable plastic bag. When you remove the mixture from the freezer, it will be slushy. Squeeze out the excess air. Place this bag inside another plastic bag. Slide the bag into a pillowcase or place a damp towel between your skin and the bag.  SEEK MEDICAL CARE IF:  You develop white spots on your skin. This may give the skin a blotchy (mottled) appearance.   Your skin turns blue or pale.   Your skin becomes waxy or hard.   Your swelling gets worse.  MAKE SURE YOU:   Understand these instructions.   Will watch your condition.   Will get help right away if you are not doing well or get worse.  Document Released: 06/23/2011  Document Revised: 10/16/2011 Document Reviewed: 06/23/2011 [facility] Patient Information 2012 [place], [place].  Take the pain medicine as directed.  Take ibuprofen 800 mg every 8 hrs with food.  Apply ice several times daily and elevate foot as much as possible.  Wear the ASO splint for 2-3 weeks.  Use the crutches as needed and bear weight as tolerated.  Follow up with dr. Dr. Romeo Apple.

## 2012-02-17 NOTE — ED Notes (Signed)
Pt given ice pack, leg elevated on bed. Awaiting xray.

## 2012-02-17 NOTE — ED Notes (Signed)
Twisted right ankle this morning after walking into a hole

## 2012-02-18 NOTE — ED Provider Notes (Signed)
Medical screening examination/treatment/procedure(s) were performed by non-physician practitioner and as supervising physician I was immediately available for consultation/collaboration.  Donnetta Hutching, MD 02/18/12 1715

## 2014-05-11 ENCOUNTER — Encounter (INDEPENDENT_AMBULATORY_CARE_PROVIDER_SITE_OTHER): Payer: Self-pay | Admitting: Internal Medicine

## 2014-05-11 ENCOUNTER — Ambulatory Visit (INDEPENDENT_AMBULATORY_CARE_PROVIDER_SITE_OTHER): Payer: BC Managed Care – PPO | Admitting: Internal Medicine

## 2014-05-11 VITALS — BP 124/70 | HR 76 | Temp 97.9°F | Ht 71.0 in | Wt 264.6 lb

## 2014-05-11 DIAGNOSIS — K5900 Constipation, unspecified: Secondary | ICD-10-CM | POA: Insufficient documentation

## 2014-05-11 DIAGNOSIS — K6289 Other specified diseases of anus and rectum: Secondary | ICD-10-CM | POA: Insufficient documentation

## 2014-05-11 NOTE — Patient Instructions (Addendum)
Colonoscopy with Dr. Karilyn Cotaehman. The risks and benefits such as perforation, bleeding, and infection were reviewed with the patient and is agreeable. Miralax daily. Continue the Probiotic

## 2014-05-11 NOTE — Progress Notes (Signed)
Subjective:     Patient ID: David Boyd, male   DOB: Jun 08, 1986, 28 y.o.   MRN: [id number]  HPI Referred to our office by Dwyane LuoBen Mann PA-C for rectal bleeding and painful BMs. Symptoms for a few months. He ha blood on his stool and when he wipes.  Usually see blood maybe once a week. He says he is constipated. He has a BM every other day.  He has to strain to have a BM most of the time. He also tells me a few months ago he had a black stool.  Appetite is good. Weight loss 280 to 264 since spring. Grandfather had colon cancer in his 7150s.  He also tells me several months ago he was on an antibiotic for a sinus infection in March.  Review of Systems History reviewed. No pertinent past medical history.  Past Surgical History  Procedure Laterality Date  . Pilondial cyst removed      No Known Allergies  Current Outpatient Prescriptions on File Prior to Visit  Medication Sig Dispense Refill  . ibuprofen (ADVIL,MOTRIN) 200 MG tablet Take 600 mg by mouth as needed. For pain       No current facility-administered medications on file prior to visit.        Objective:   Physical Exam  Filed Vitals:   05/11/14 1548  BP: 124/70  Pulse: 76  Temp: 97.9 F (36.6 C)  Height: 5\' 11"  (1.803 m)  Weight: 264 lb 9.6 oz (120.022 kg)   Alert and oriented. Skin warm and dry. Oral mucosa is moist.   . Sclera anicteric, conjunctivae is pink. Thyroid not enlarged. No cervical lymphadenopathy. Lungs clear. Heart regular rate and rhythm.  Abdomen is soft. Bowel sounds are positive. No hepatomegaly. No abdominal masses felt.  Obese. No tenderness.  No edema to lower extremities. Stool brown and guaiac negative.      Assessment:     Rectal bleeding,rectal pain. Inflammatory bowel disease needs to be ruled out. I discussed with Dr. Karilyn Cotaehman.   Constipation.    Plan:     Colonoscopy with Dr Karilyn Cotaehman. The risks and benefits such as perforation, bleeding, and infection were reviewed with the patient and is  agreeable.   Continue the Miralax 1 scoop daily. Continue the Probiotic.

## 2014-05-17 ENCOUNTER — Telehealth (INDEPENDENT_AMBULATORY_CARE_PROVIDER_SITE_OTHER): Payer: Self-pay | Admitting: *Deleted

## 2014-05-17 ENCOUNTER — Other Ambulatory Visit (INDEPENDENT_AMBULATORY_CARE_PROVIDER_SITE_OTHER): Payer: Self-pay | Admitting: *Deleted

## 2014-05-17 DIAGNOSIS — K6289 Other specified diseases of anus and rectum: Secondary | ICD-10-CM

## 2014-05-17 DIAGNOSIS — K5900 Constipation, unspecified: Secondary | ICD-10-CM

## 2014-05-17 DIAGNOSIS — Z1211 Encounter for screening for malignant neoplasm of colon: Secondary | ICD-10-CM

## 2014-05-17 DIAGNOSIS — K625 Hemorrhage of anus and rectum: Secondary | ICD-10-CM

## 2014-05-17 MED ORDER — PEG-KCL-NACL-NASULF-NA ASC-C 100 G PO SOLR: 1.0000 | Freq: Once | ORAL | Status: DC

## 2014-05-17 NOTE — Telephone Encounter (Signed)
Patient needs movi prep 

## 2014-05-29 ENCOUNTER — Encounter (HOSPITAL_COMMUNITY): Payer: Self-pay | Admitting: Pharmacy Technician

## 2014-06-01 ENCOUNTER — Encounter (HOSPITAL_COMMUNITY): Payer: Self-pay

## 2014-06-01 ENCOUNTER — Encounter (HOSPITAL_COMMUNITY)
Admission: RE | Disposition: A | Discharge status: Home / Self Care | Payer: Self-pay | Source: Ambulatory Visit | Attending: Internal Medicine

## 2014-06-01 ENCOUNTER — Ambulatory Visit (HOSPITAL_COMMUNITY)
Admission: RE | Admit: 2014-06-01 | Discharge: 2014-06-01 | Disposition: A | Discharge status: Home / Self Care | Payer: BC Managed Care – PPO | Source: Ambulatory Visit | Attending: Internal Medicine | Admitting: Internal Medicine

## 2014-06-01 DIAGNOSIS — R109 Unspecified abdominal pain: Secondary | ICD-10-CM | POA: Insufficient documentation

## 2014-06-01 DIAGNOSIS — K6289 Other specified diseases of anus and rectum: Secondary | ICD-10-CM

## 2014-06-01 DIAGNOSIS — K625 Hemorrhage of anus and rectum: Secondary | ICD-10-CM

## 2014-06-01 DIAGNOSIS — K602 Anal fissure, unspecified: Secondary | ICD-10-CM | POA: Insufficient documentation

## 2014-06-01 DIAGNOSIS — K644 Residual hemorrhoidal skin tags: Secondary | ICD-10-CM

## 2014-06-01 DIAGNOSIS — K921 Melena: Secondary | ICD-10-CM | POA: Insufficient documentation

## 2014-06-01 DIAGNOSIS — K6389 Other specified diseases of intestine: Secondary | ICD-10-CM

## 2014-06-01 DIAGNOSIS — K5900 Constipation, unspecified: Secondary | ICD-10-CM | POA: Insufficient documentation

## 2014-06-01 HISTORY — PX: COLONOSCOPY: SHX5424

## 2014-06-01 SURGERY — COLONOSCOPY
Anesthesia: Moderate Sedation

## 2014-06-01 MED ORDER — STERILE WATER FOR IRRIGATION IR SOLN
Status: DC | PRN
  Administered 2014-06-01: 08:00:00

## 2014-06-01 MED ORDER — LIDOCAINE HCL 2 % EX GEL
CUTANEOUS | Status: AC
  Filled 2014-06-01: qty 30

## 2014-06-01 MED ORDER — MIDAZOLAM HCL 5 MG/5ML IJ SOLN
INTRAMUSCULAR | Status: AC
  Filled 2014-06-01: qty 10

## 2014-06-01 MED ORDER — SODIUM CHLORIDE 0.9 % IV SOLN
INTRAVENOUS | Status: DC
  Administered 2014-06-01: 07:00:00 via INTRAVENOUS

## 2014-06-01 MED ORDER — MIDAZOLAM HCL 5 MG/5ML IJ SOLN
INTRAMUSCULAR | Status: AC
  Filled 2014-06-01: qty 5

## 2014-06-01 MED ORDER — DOCUSATE SODIUM 100 MG PO CAPS: 200.0000 mg | ORAL_CAPSULE | Freq: Every day | ORAL | Status: DC

## 2014-06-01 MED ORDER — LIDOCAINE HCL 2 % EX GEL
CUTANEOUS | Status: DC | PRN
  Administered 2014-06-01: 1 via TOPICAL

## 2014-06-01 MED ORDER — BENEFIBER DRINK MIX PO PACK: 4.0000 g | PACK | Freq: Every day | ORAL | Status: DC

## 2014-06-01 MED ORDER — MEPERIDINE HCL 50 MG/ML IJ SOLN
INTRAMUSCULAR | Status: DC | PRN
  Administered 2014-06-01 (×2): 25 mg via INTRAVENOUS

## 2014-06-01 MED ORDER — PSYLLIUM 28 % PO PACK: 1.0000 | PACK | Freq: Every day | ORAL | Status: DC

## 2014-06-01 MED ORDER — DILTIAZEM GEL 2 %: 1.0000 "application " | Freq: Two times a day (BID) | CUTANEOUS | Status: DC

## 2014-06-01 MED ORDER — MIDAZOLAM HCL 5 MG/5ML IJ SOLN
INTRAMUSCULAR | Status: DC | PRN
  Administered 2014-06-01 (×3): 2 mg via INTRAVENOUS
  Administered 2014-06-01: 3 mg via INTRAVENOUS
  Administered 2014-06-01: 2 mg via INTRAVENOUS

## 2014-06-01 MED ORDER — MEPERIDINE HCL 50 MG/ML IJ SOLN
INTRAMUSCULAR | Status: AC
  Filled 2014-06-01: qty 1

## 2014-06-01 NOTE — Op Note (Signed)
COLONOSCOPY PROCEDURE REPORT  PATIENT:  David BeersRobert L Gavia  MR#:  [id number] Birthdate:  [birth year], 28 y.o., male Endoscopist:  Dr. Malissa HippoNajeeb U. Rehman, MD Referred By:  Dr. Colette RibasJohn Cabot Golding, MD  Procedure Date: 06/01/2014  Procedure:   Colonoscopy  Indications:  Patient is a 28 year old Caucasian male who presents with constipation no abdominal pain and rectal bleeding and painful defecation.  Informed Consent:  The procedure and risks were reviewed with the patient and informed consent was obtained.  Medications:  Demerol 50 mg IV Versed 11 mg IV  Description of procedure:  After a digital rectal exam was performed, that colonoscope was advanced from the anus through the rectum and colon to the area of the cecum, ileocecal valve and appendiceal orifice. The cecum was deeply intubated. These structures were well-seen and photographed for the record. From the level of the cecum and ileocecal valve, the scope was slowly and cautiously withdrawn. The mucosal surfaces were carefully surveyed utilizing scope tip to flexion to facilitate fold flattening as needed. The scope was pulled down into the rectum where a thorough exam including retroflexion was performed.  Findings:   Prep excellent. Normal mucosa of cecum, ascending colon, hepatic flexure, transverse colon, splenic flexure, descending and sigmoid colon. Normal rectal mucosa. Single linear ulceration noted involving proximal part of the canal along with small papillae hemorrhoids.   Therapeutic/Diagnostic Maneuvers Performed:   None  Complications:  None  Cecal Withdrawal Time:  7  minutes  Impression:  Examination performed to cecum. No evidence of endoscopic colitis or proctitis. Anal fissure, small external hemorrhoids and anal papillae.  Recommendations:  Standard instructions given. High fiber diet. Metamucil 4 g by mouth each bedtime. Colace 20 mg by mouth each bedtime. Diltiazem gel 2% to be applied in canal twice  daily for 4 weeks. Office visit in 8 weeks.  REHMAN,NAJEEB U  06/01/2014 8:13 AM  CC: Dr. Phillips OdorGOLDING, Chancy HurterJOHN CABOT, MD & Dr. Bonnetta BarryNo ref. provider found

## 2014-06-01 NOTE — Discharge Instructions (Signed)
Resume usual medications.  High fiber diet. Colace 20 mg by mouth each daily at bedtime. Metamucil 4 g per one packet daily by mouth at bedtime. Diltiazem gel 2% applied to the ear canal twice daily for a 4 weeks. No driving for 24 hours. Office visit in 8 weeks.  Colonoscopy, Care After Refer to this sheet in the next few weeks. These instructions provide you with information on caring for yourself after your procedure. Your health care provider may also give you more specific instructions. Your treatment has been planned according to current medical practices, but problems sometimes occur. Call your health care provider if you have any problems or questions after your procedure. WHAT TO EXPECT AFTER THE PROCEDURE  After your procedure, it is typical to have the following:  A small amount of blood in your stool.  Moderate amounts of gas and mild abdominal cramping or bloating. HOME CARE INSTRUCTIONS  Do not drive, operate machinery, or sign important documents for 24 hours.  You may shower and resume your regular physical activities, but move at a slower pace for the first 24 hours.  Take frequent rest periods for the first 24 hours.  Walk around or put a warm pack on your abdomen to help reduce abdominal cramping and bloating.  Drink enough fluids to keep your urine clear or pale yellow.  You may resume your normal diet as instructed by your health care provider. Avoid heavy or fried foods that are hard to digest.  Avoid drinking alcohol for 24 hours or as instructed by your health care provider.  Only take over-the-counter or prescription medicines as directed by your health care provider.  If a tissue sample (biopsy) was taken during your procedure:  Do not take aspirin or blood thinners for 7 days, or as instructed by your health care provider.  Do not drink alcohol for 7 days, or as instructed by your health care provider.  Eat soft foods for the first 24 hours. SEEK  MEDICAL CARE IF: You have persistent spotting of blood in your stool 2-3 days after the procedure. SEEK IMMEDIATE MEDICAL CARE IF:  You have more than a small spotting of blood in your stool.  You pass large blood clots in your stool.  Your abdomen is swollen (distended).  You have nausea or vomiting.  You have a fever.  You have increasing abdominal pain that is not relieved with medicine. Document Released: 06/10/2004 Document Revised: 08/17/2013 Document Reviewed: 07/04/2013 [facility] Patient Information 2015 [place], [place]. This information is not intended to replace advice given to you by your health care provider. Make sure you discuss any questions you have with your health care provider.   High-Fiber Diet Fiber is found in fruits, vegetables, and grains. A high-fiber diet encourages the addition of more whole grains, legumes, fruits, and vegetables in your diet. The recommended amount of fiber for adult males is 38 g per day. For adult females, it is 25 g per day. Pregnant and lactating women should get 28 g of fiber per day. If you have a digestive or bowel problem, ask your caregiver for advice before adding high-fiber foods to your diet. Eat a variety of high-fiber foods instead of only a select few type of foods.  PURPOSE  To increase stool bulk.  To make bowel movements more regular to prevent constipation.  To lower cholesterol.  To prevent overeating. WHEN IS THIS DIET USED?  It may be used if you have constipation and hemorrhoids.  It may  be used if you have uncomplicated diverticulosis (intestine condition) and irritable bowel syndrome.  It may be used if you need help with weight management.  It may be used if you want to add it to your diet as a protective measure against atherosclerosis, diabetes, and cancer. SOURCES OF FIBER  Whole-grain breads and cereals.  Fruits, such as apples, oranges, bananas, berries, prunes, and pears.  Vegetables, such as  green peas, carrots, sweet potatoes, beets, broccoli, cabbage, spinach, and artichokes.  Legumes, such split peas, soy, lentils.  Almonds. FIBER CONTENT IN FOODS Starches and Grains / Dietary Fiber (g)  Cheerios, 1 cup / 3 g  Corn Flakes cereal, 1 cup / 0.7 g  Rice crispy treat cereal, 1 cup / 0.3 g  Instant oatmeal (cooked),  cup / 2 g  Frosted wheat cereal, 1 cup / 5.1 g  Brown, long-grain rice (cooked), 1 cup / 3.5 g  White, long-grain rice (cooked), 1 cup / 0.6 g  Enriched macaroni (cooked), 1 cup / 2.5 g Legumes / Dietary Fiber (g)  Baked beans (canned, plain, or vegetarian),  cup / 5.2 g  Kidney beans (canned),  cup / 6.8 g  Pinto beans (cooked),  cup / 5.5 g Breads and Crackers / Dietary Fiber (g)  Plain or honey graham crackers, 2 squares / 0.7 g  Saltine crackers, 3 squares / 0.3 g  Plain, salted pretzels, 10 pieces / 1.8 g  Whole-wheat bread, 1 slice / 1.9 g  White bread, 1 slice / 0.7 g  Raisin bread, 1 slice / 1.2 g  Plain bagel, 3 oz / 2 g  Flour tortilla, 1 oz / 0.9 g  Corn tortilla, 1 small / 1.5 g  Hamburger or hotdog bun, 1 small / 0.9 g Fruits / Dietary Fiber (g)  Apple with skin, 1 medium / 4.4 g  Sweetened applesauce,  cup / 1.5 g  Banana,  medium / 1.5 g  Grapes, 10 grapes / 0.4 g  Orange, 1 small / 2.3 g  Raisin, 1.5 oz / 1.6 g  Melon, 1 cup / 1.4 g Vegetables / Dietary Fiber (g)  Green beans (canned),  cup / 1.3 g  Carrots (cooked),  cup / 2.3 g  Broccoli (cooked),  cup / 2.8 g  Peas (cooked),  cup / 4.4 g  Mashed potatoes,  cup / 1.6 g  Lettuce, 1 cup / 0.5 g  Corn (canned),  cup / 1.6 g  Tomato,  cup / 1.1 g Document Released: 10/27/2005 Document Revised: 04/27/2012 Document Reviewed: 01/29/2012 [employer] Patient Information 2015 [employer]. This information is not intended to replace advice given to you by your health care provider. Make sure you discuss any questions you have with your  health care provider.

## 2014-06-01 NOTE — H&P (Signed)
David Boyd is an 28 y.o. male.   Chief Complaint: Patient is here for colonoscopy. HPI: Patient is a 28 year old Caucasian male who presents with a 10 month history of constipation intermittent hematochezia in excruciating pain during defecation. Symptoms started after antibiotic and became constipated. He has tried fiber supplement and MiraLax but without benefit. He also has low abdominal pain which generally occurs before his bowels move. Early on he was seeing blood with his bowel movements frequently but lately it has been intermittent. He has good appetite and his weight has been stable. Family history is negative for inflammatory bowel disease.   History reviewed. No pertinent past medical history.  Past Surgical History  Procedure Laterality Date  . Pilondial cyst removed    . Pilonidal cyst excision      [facility]- Dr Romona Curls    History reviewed. No pertinent family history. Social History:  reports that he has never smoked. He does not have any smokeless tobacco history on file. He reports that he drinks alcohol. He reports that he does not use illicit drugs.  Allergies: No Known Allergies  Medications Prior to Admission  Medication Sig Dispense Refill  . ibuprofen (ADVIL,MOTRIN) 200 MG tablet Take 600 mg by mouth as needed. For pain      . peg 3350 powder (MOVIPREP) 100 G SOLR Take 1 kit (200 g total) by mouth once.  1 kit  0    No results found for this or any previous visit (from the past 48 hour(s)). No results found.  ROS  Blood pressure 144/84, pulse 62, temperature 97.9 F (36.6 C), temperature source Oral, resp. rate 12, height 5' 11"  (1.803 m), weight 264 lb (119.75 kg), SpO2 97.00%. Physical Exam  Constitutional: He appears well-developed and well-nourished.  HENT:  Mouth/Throat: Oropharynx is clear and moist.  Eyes: Conjunctivae are normal. No scleral icterus.  Neck: No thyromegaly present.  Cardiovascular: Normal rate, regular rhythm and normal heart  sounds.   No murmur heard. Respiratory: Effort normal and breath sounds normal.  GI: Soft. He exhibits no distension and no mass. There is no tenderness.  Musculoskeletal: He exhibits no edema.  Lymphadenopathy:    He has no cervical adenopathy.  Neurological: He is alert.  Skin: Skin is warm.     Assessment/Plan Rectal bleeding abdominal pain and painful defecation. Constipation. Diagnostic colonoscopy.  Durenda Pechacek U 06/01/2014, 7:34 AM

## 2014-06-02 ENCOUNTER — Encounter (HOSPITAL_COMMUNITY): Payer: Self-pay | Admitting: Internal Medicine

## 2014-07-27 ENCOUNTER — Ambulatory Visit (INDEPENDENT_AMBULATORY_CARE_PROVIDER_SITE_OTHER): Payer: BC Managed Care – PPO | Admitting: Internal Medicine

## 2014-08-03 ENCOUNTER — Encounter (INDEPENDENT_AMBULATORY_CARE_PROVIDER_SITE_OTHER): Payer: Self-pay | Admitting: *Deleted

## 2014-08-03 ENCOUNTER — Telehealth (INDEPENDENT_AMBULATORY_CARE_PROVIDER_SITE_OTHER): Payer: Self-pay | Admitting: *Deleted

## 2014-08-03 NOTE — Telephone Encounter (Signed)
David Boyd SHOWED for his apt with Dorene Ar, NP on 07/27/14. A NS letter has been mailed.

## 2016-10-09 ENCOUNTER — Ambulatory Visit: Payer: Self-pay | Admitting: Orthopaedic Surgery

## 2016-10-14 ENCOUNTER — Ambulatory Visit: Payer: Self-pay | Admitting: Orthopaedic Surgery

## 2016-12-17 ENCOUNTER — Emergency Department (HOSPITAL_COMMUNITY)
Admission: EM | Admit: 2016-12-17 | Discharge: 2016-12-17 | Disposition: A | Discharge status: Home / Self Care | Payer: Worker's Compensation | Attending: Emergency Medicine | Admitting: Emergency Medicine

## 2016-12-17 ENCOUNTER — Emergency Department (HOSPITAL_COMMUNITY): Payer: Worker's Compensation

## 2016-12-17 ENCOUNTER — Encounter (HOSPITAL_COMMUNITY): Payer: Self-pay | Admitting: *Deleted

## 2016-12-17 DIAGNOSIS — Z23 Encounter for immunization: Secondary | ICD-10-CM | POA: Insufficient documentation

## 2016-12-17 DIAGNOSIS — S68011A Complete traumatic metacarpophalangeal amputation of right thumb, initial encounter: Secondary | ICD-10-CM | POA: Diagnosis not present

## 2016-12-17 DIAGNOSIS — Y929 Unspecified place or not applicable: Secondary | ICD-10-CM | POA: Diagnosis not present

## 2016-12-17 DIAGNOSIS — W260XXA Contact with knife, initial encounter: Secondary | ICD-10-CM | POA: Diagnosis not present

## 2016-12-17 DIAGNOSIS — S6991XA Unspecified injury of right wrist, hand and finger(s), initial encounter: Secondary | ICD-10-CM | POA: Diagnosis present

## 2016-12-17 DIAGNOSIS — Y939 Activity, unspecified: Secondary | ICD-10-CM | POA: Diagnosis not present

## 2016-12-17 DIAGNOSIS — Y999 Unspecified external cause status: Secondary | ICD-10-CM | POA: Diagnosis not present

## 2016-12-17 IMAGING — DX DG FINGER THUMB 2+V*R*
3 series · 3 of 3 positions shown · non-contrast
Comparison: None.

CLINICAL DATA: Right thumb laceration with a pocket knife today.
Initial encounter.

EXAM:
RIGHT THUMB 2+V

[finger ap]
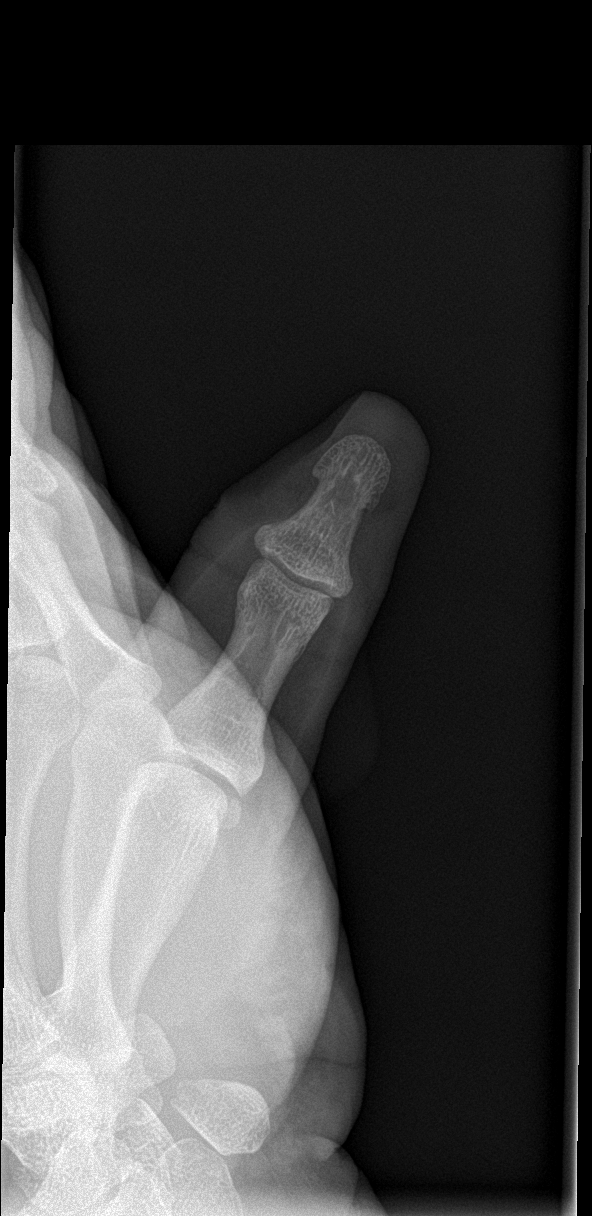

[finger obl]
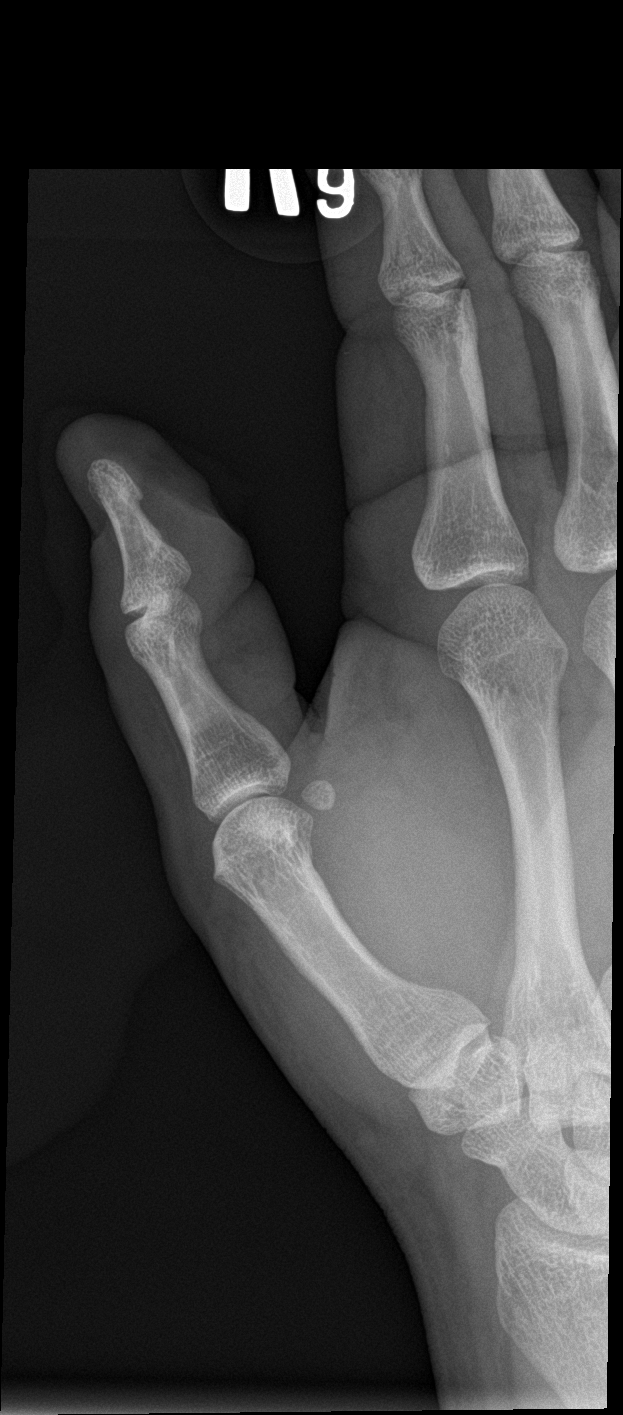

[finger lat]
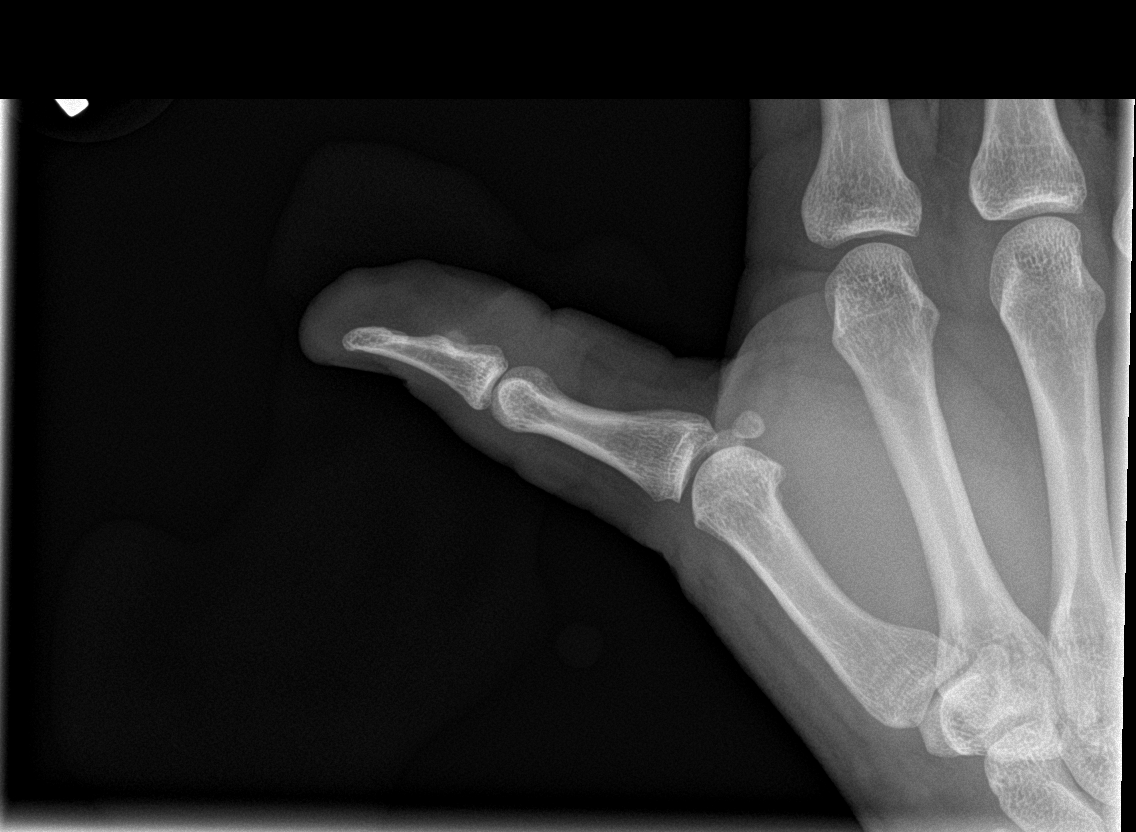

[3 of 3 positions shown; findings below may reference images not displayed]

FINDINGS: Skin defect along the ulnar aspect of the distal right bone
consistent with laceration is seen. No radiopaque foreign body is
identified. No bony or joint abnormality.
IMPRESSION: Laceration without fracture foreign body.

## 2016-12-17 MED ORDER — TETANUS-DIPHTH-ACELL PERTUSSIS 5-2.5-18.5 LF-MCG/0.5 IM SUSP
0.5000 mL | Freq: Once | INTRAMUSCULAR | Status: AC
  Administered 2016-12-17: 0.5 mL via INTRAMUSCULAR
  Filled 2016-12-17: qty 0.5

## 2016-12-17 MED ORDER — BUPIVACAINE HCL (PF) 0.5 % IJ SOLN
10.0000 mL | Freq: Once | INTRAMUSCULAR | Status: AC
  Administered 2016-12-17: 4 mL
  Filled 2016-12-17: qty 30

## 2016-12-17 MED ORDER — HYDROCODONE-ACETAMINOPHEN 5-325 MG PO TABS: 1.0000 | ORAL_TABLET | ORAL | 0 refills | Status: DC | PRN

## 2016-12-17 NOTE — ED Notes (Signed)
ED Provider at bedside. 

## 2016-12-17 NOTE — Discharge Instructions (Signed)
Sutures to be removed in 7 to 10 days. 

## 2016-12-17 NOTE — ED Provider Notes (Signed)
[facility]-EMER[facility]ENCY DEPT Provider Note   CSN: [id number] Arrival date & time: 12/17/16  1411     History   Chief Complaint Chief Complaint  Patient presents with  . Finger Injury    HPI David Boyd is a 31 y.o. male.  Pt presents to the ED today with a partial right thumb amputation.  He was cutting something with his pocket knife and cut the tip of his thumb.  He said it bled a lot.  [place] [facility] and the jail nurse put a dressing on it.  Pt is left handed.        History reviewed. No pertinent past medical history.  Patient Active Problem List   Diagnosis Date Noted  . Unspecified constipation 05/11/2014  . Rectal pain 05/11/2014    Past Surgical History:  Procedure Laterality Date  . COLONOSCOPY N/A 06/01/2014   Procedure: COLONOSCOPY;  Surgeon: Malissa Hippo, MD;  Location: [facility] SUITE;  Service: Endoscopy;  Laterality: N/A;  730  . Pilondial cyst removed    . PILONIDAL CYST EXCISION     [facility]- Dr Malvin Johns       Home Medications    Prior to Admission medications   Medication Sig Start Date End Date Taking? Authorizing Provider  diltiazem 2 % GEL Apply 1 application topically 2 (two) times daily. 06/01/14   Malissa Hippo, MD  docusate sodium (COLACE) 100 MG capsule Take 2 capsules (200 mg total) by mouth daily. 06/01/14   Malissa Hippo, MD  HYDROcodone-acetaminophen (NORCO/VICODIN) 5-325 MG tablet Take 1 tablet by mouth every 4 (four) hours as needed. 12/17/16   Jacalyn Lefevre, MD  ibuprofen (ADVIL,MOTRIN) 200 MG tablet Take 600 mg by mouth as needed. For pain    Historical Provider, MD  psyllium (METAMUCIL SMOOTH TEXTURE) 28 % packet Take 1 packet by mouth at bedtime. 06/01/14   Malissa Hippo, MD    Family History No family history on file.  Social History Social History  Substance Use Topics  . Smoking status: Never Smoker  . Smokeless tobacco: Never Used  . Alcohol use Yes     Comment: occasional     Allergies   Patient has no  known allergies.   Review of Systems Review of Systems  Musculoskeletal:       Partial right thumb amputation  All other systems reviewed and are negative.    Physical Exam Updated Vital Signs BP 161/96 (BP Location: Right Arm)   Pulse (!) 125   Temp 98.4 F (36.9 C) (Oral)   Resp 18   Ht 5\' 11"  (1.803 m)   Wt 270 lb (122.5 kg)   SpO2 96%   BMI 37.66 kg/m   Physical Exam  Constitutional: He appears well-developed and well-nourished.  HENT:  Head: Normocephalic and atraumatic.  Nose: Nose normal.  Mouth/Throat: Oropharynx is clear and moist.  Eyes: Conjunctivae and EOM are normal. Pupils are equal, round, and reactive to light.  Neck: Normal range of motion. Neck supple.  Cardiovascular: Regular rhythm, normal heart sounds and intact distal pulses.  Tachycardia present.   Pulmonary/Chest: Effort normal and breath sounds normal.  Musculoskeletal:  Right thumb pad amputation.  No bony or fingernail involvement.  Multiple small arterioles spurting.  Nursing note and vitals reviewed.    ED Treatments / Results  Labs (all labs ordered are listed, but only abnormal results are displayed) Labs Reviewed - No data to display  EKG  EKG Interpretation None  Radiology Dg Finger Thumb Right  Result Date: 12/17/2016 CLINICAL DATA:  Right thumb laceration with a pocket knife today. Initial encounter. EXAM: RIGHT THUMB 2+V COMPARISON:  None. FINDINGS: Skin defect along the ulnar aspect of the distal right bone consistent with laceration is seen. No radiopaque foreign body is identified. No bony or joint abnormality. IMPRESSION: Laceration without fracture foreign body. Electronically Signed   By: Drusilla Kannerhomas  Dalessio M.D.   On: 12/17/2016 14:57    Procedures .Marland Kitchen.Laceration Repair Date/Time: 12/17/2016 3:34 PM Performed by: Jacalyn LefevreHAVILAND, Peony Barner Authorized by: Jacalyn LefevreHAVILAND, Laquasha Groome   Consent:    Consent obtained:  Verbal   Consent given by:  Patient   Risks discussed:  Infection and  pain   Alternatives discussed:  No treatment Anesthesia (see MAR for exact dosages):    Anesthesia method:  Nerve block   Block location:  Digital   Block needle gauge:  27 G   Block anesthetic:  Bupivacaine 0.5% w/o epi   Block technique:  Digital   Block injection procedure:  Anatomic landmarks identified, introduced needle, incremental injection, negative aspiration for blood and anatomic landmarks palpated   Block outcome:  Anesthesia achieved Laceration details:    Location:  Finger   Finger location:  R thumb Repair type:    Repair type:  Intermediate Pre-procedure details:    Preparation:  Patient was prepped and draped in usual sterile fashion Exploration:    Hemostasis achieved with:  Tied off vessels   Contaminated: no   Treatment:    Area cleansed with:  Saline   Amount of cleaning:  Standard   Irrigation solution:  Sterile saline Skin repair:    Repair method:  Sutures   Suture size:  4-0   Suture material:  Nylon   Suture technique:  Simple interrupted Approximation:    Approximation:  Close Post-procedure details:    Dressing:  Adhesive bandage   Patient tolerance of procedure:  Tolerated well, no immediate complications Comments:     Pt had multiple spurting arterioles.  I placed stitches throughout the fingertip pad to get them to stop.  I had to place a few stitches on top of each other to add force to stop the bleeding.  I lost track of the number of stitches as I was trying to stop the spurting.  I am estimating around 10 stitches.  Pt aware that I did not know the exact number of stitches.  Quick clot applied to the pad after there arteriole bleeding stopped to help the ooze of the lacerated skin.   (including critical care time)  Medications Ordered in ED Medications  Tdap (BOOSTRIX) injection 0.5 mL (0.5 mLs Intramuscular Given 12/17/16 1524)  bupivacaine (MARCAINE) 0.5 % injection 10 mL (4 mLs Infiltration Given 12/17/16 1448)     Initial Impression /  Assessment and Plan / ED Course  I have reviewed the triage vital signs and the nursing notes.  Pertinent labs & imaging results that were available during my care of the patient were reviewed by me and considered in my medical decision making (see chart for details).    Pt instructed to get stitches removed in 7 to 10 days.  He is to follow with Dr. Mina MarbleWeingold (hand).  Return if worse.  Final Clinical Impressions(s) / ED Diagnoses   Final diagnoses:  Traumatic amputation of thumb (complete) (partial), right, initial encounter    New Prescriptions New Prescriptions   HYDROCODONE-ACETAMINOPHEN (NORCO/VICODIN) 5-325 MG TABLET    Take 1 tablet by mouth every  4 (four) hours as needed.     Jacalyn Lefevre, MD 12/17/16 1538

## 2016-12-17 NOTE — ED Triage Notes (Signed)
Pt was cutting something when his pocket knife cut the tip of his right hand thumb off. Pt has remnants of thumb in a bag. Hand is wrapped at this time.

## 2016-12-17 NOTE — ED Notes (Signed)
Dressing applied and secured by EDP.

## 2017-08-21 ENCOUNTER — Emergency Department (HOSPITAL_COMMUNITY): Payer: Commercial Managed Care - PPO

## 2017-08-21 ENCOUNTER — Emergency Department (HOSPITAL_COMMUNITY)
Admission: EM | Admit: 2017-08-21 | Discharge: 2017-08-21 | Disposition: A | Discharge status: Home / Self Care | Payer: Commercial Managed Care - PPO | Attending: Emergency Medicine | Admitting: Emergency Medicine

## 2017-08-21 ENCOUNTER — Encounter (HOSPITAL_COMMUNITY): Payer: Self-pay | Admitting: Emergency Medicine

## 2017-08-21 DIAGNOSIS — Z79899 Other long term (current) drug therapy: Secondary | ICD-10-CM | POA: Diagnosis not present

## 2017-08-21 DIAGNOSIS — S93402A Sprain of unspecified ligament of left ankle, initial encounter: Secondary | ICD-10-CM | POA: Diagnosis not present

## 2017-08-21 DIAGNOSIS — Y998 Other external cause status: Secondary | ICD-10-CM | POA: Diagnosis not present

## 2017-08-21 DIAGNOSIS — Y929 Unspecified place or not applicable: Secondary | ICD-10-CM | POA: Insufficient documentation

## 2017-08-21 DIAGNOSIS — Y9389 Activity, other specified: Secondary | ICD-10-CM | POA: Diagnosis not present

## 2017-08-21 DIAGNOSIS — W010XXA Fall on same level from slipping, tripping and stumbling without subsequent striking against object, initial encounter: Secondary | ICD-10-CM | POA: Diagnosis not present

## 2017-08-21 DIAGNOSIS — S99912A Unspecified injury of left ankle, initial encounter: Secondary | ICD-10-CM | POA: Diagnosis present

## 2017-08-21 IMAGING — DX DG ANKLE COMPLETE 3+V*L*
3 series · 3 of 3 positions shown · non-contrast
Comparison: None.

CLINICAL DATA: Initial evaluation for acute injury, twisted ankle.

EXAM:
LEFT ANKLE COMPLETE - 3+ VIEW

[ankle ap]
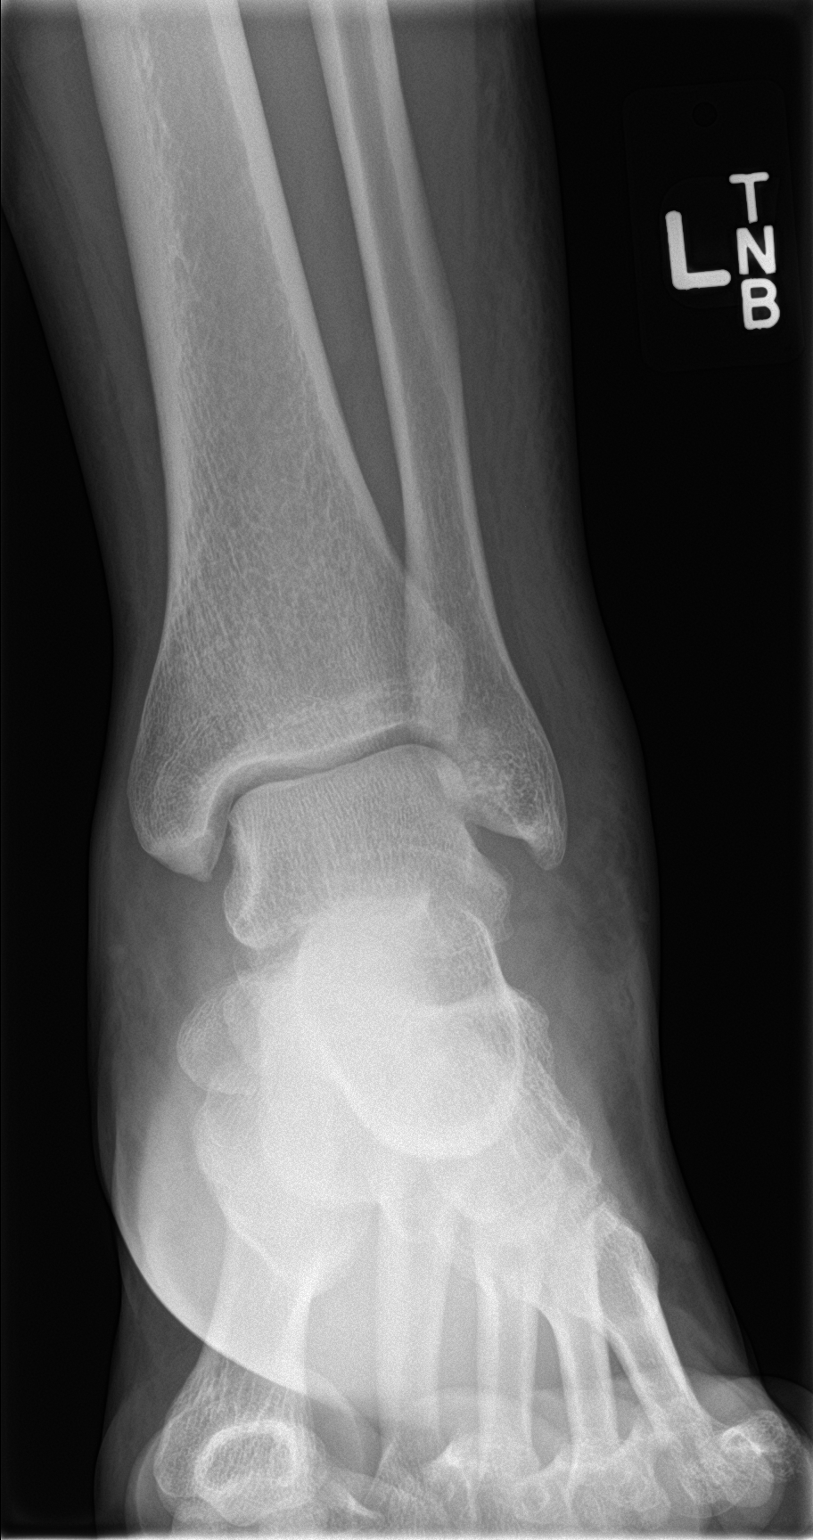

[ankle obl]
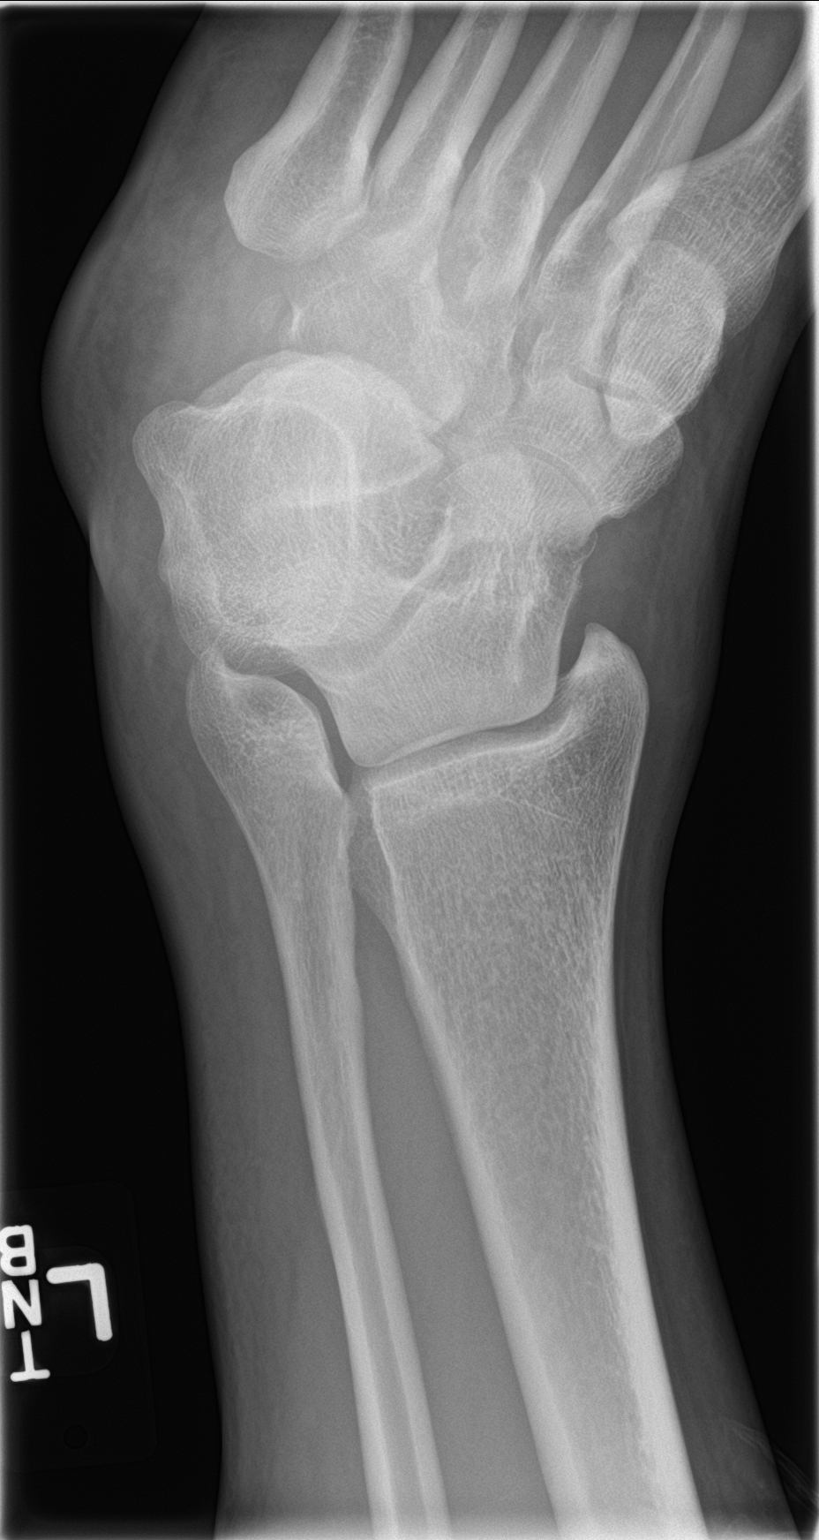

[ankle lat]
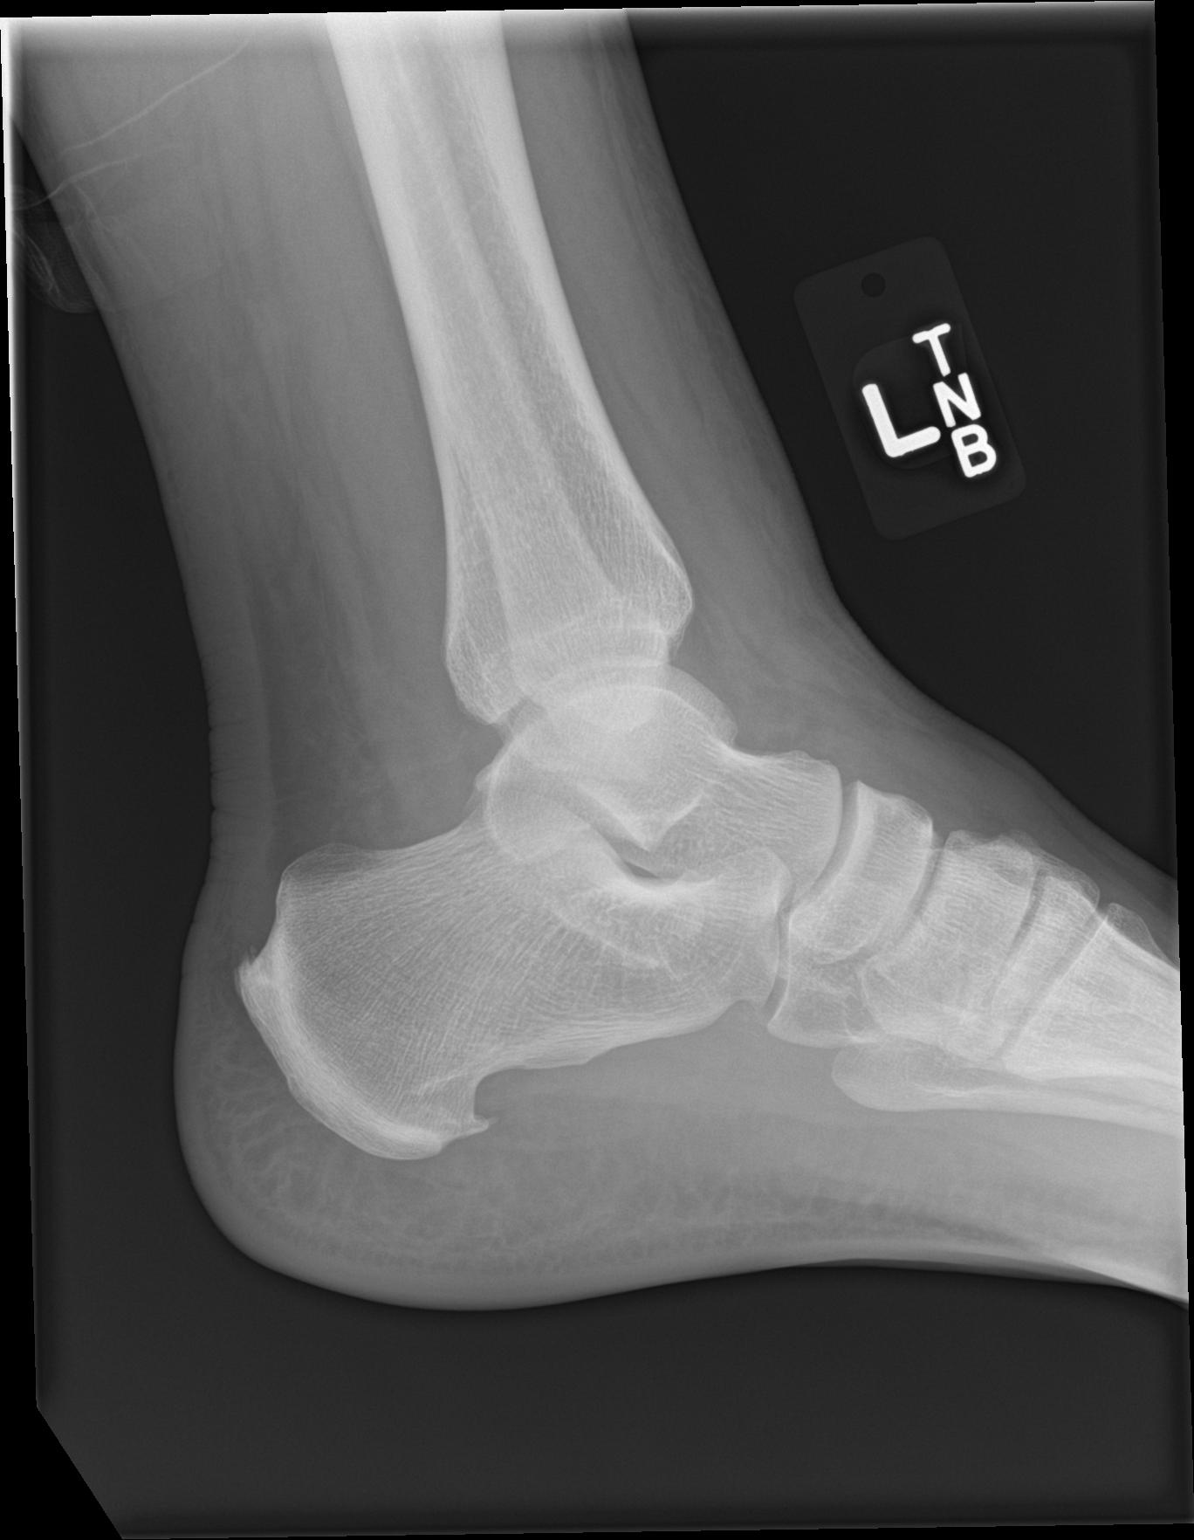

[3 of 3 positions shown; findings below may reference images not displayed]

FINDINGS: No acute fracture or dislocation. Ankle mortise approximated. Talar
dome intact. Prominent soft tissue swelling about the ankle,
greatest at the lateral malleolus. Posterior plantar calcaneal
enthesophytes.
IMPRESSION: 1. No acute fracture or dislocation.
2. Prominent soft tissue swelling about the ankle.

## 2017-08-21 MED ORDER — IBUPROFEN 800 MG PO TABS: 800.0000 mg | ORAL_TABLET | Freq: Three times a day (TID) | ORAL | 0 refills | Status: DC

## 2017-08-21 NOTE — ED Notes (Signed)
Patient transported to X-ray 

## 2017-08-21 NOTE — ED Notes (Signed)
Patient returned from X-ray 

## 2017-08-21 NOTE — ED Notes (Signed)
Pt refused crutches.

## 2017-08-21 NOTE — ED Provider Notes (Signed)
[facility] Provider Note   CSN: [id number] Arrival date & time: 08/21/17  0554     History   Chief Complaint Chief Complaint  Patient presents with  . Ankle Pain    HPI David Boyd is a 31 y.o. male.  Patient presents with left ankle pain after rolling his ankle last night about 7:30 PM. States he stepped off a stepping stone and rolled his ankle inward. Did not hit his head or lose consciousness. Denies any neck or back pain. He's been taking ibuprofen and keeping the ankle iced. Has increased pain and swelling this morning. No weakness, numbness or tingling. No open wounds.   The history is provided by the patient.  Ankle Pain      History reviewed. No pertinent past medical history.  Patient Active Problem List   Diagnosis Date Noted  . Unspecified constipation 05/11/2014  . Rectal pain 05/11/2014    Past Surgical History:  Procedure Laterality Date  . COLONOSCOPY N/A 06/01/2014   Procedure: COLONOSCOPY;  Surgeon: Malissa Hippo, MD;  Location: [facility];  Service: Endoscopy;  Laterality: N/A;  730  . Pilondial cyst removed    . PILONIDAL CYST EXCISION     [facility]- Dr Malvin Johns       Home Medications    Prior to Admission medications   Medication Sig Start Date End Date Taking? Authorizing Provider  diltiazem 2 % GEL Apply 1 application topically 2 (two) times daily. 06/01/14   Malissa Hippo, MD  docusate sodium (COLACE) 100 MG capsule Take 2 capsules (200 mg total) by mouth daily. 06/01/14   Malissa Hippo, MD  HYDROcodone-acetaminophen (NORCO/VICODIN) 5-325 MG tablet Take 1 tablet by mouth every 4 (four) hours as needed. 12/17/16   Jacalyn Lefevre, MD  ibuprofen (ADVIL,MOTRIN) 200 MG tablet Take 600 mg by mouth as needed. For pain    [provider]  psyllium (METAMUCIL SMOOTH TEXTURE) 28 % packet Take 1 packet by mouth at bedtime. 06/01/14   Malissa Hippo, MD    Family History No family history on file.  Social History Social  History  Substance Use Topics  . Smoking status: Never Smoker  . Smokeless tobacco: Never Used  . Alcohol use Yes     Comment: occasional     Allergies   Patient has no known allergies.   Review of Systems Review of Systems  Constitutional: Negative for activity change, appetite change and fever.  HENT: Negative for congestion and rhinorrhea.   Respiratory: Negative for cough, chest tightness and shortness of breath.   Cardiovascular: Negative for chest pain.  Gastrointestinal: Negative for abdominal pain and vomiting.  Genitourinary: Negative for dysuria, testicular pain and urgency.  Musculoskeletal: Positive for arthralgias and myalgias. Negative for back pain and neck pain.  Skin: Negative for rash.  Neurological: Negative for dizziness, weakness, light-headedness and headaches.   all other systems are negative except as noted in the HPI and PMH.    Physical Exam Updated Vital Signs BP (!) 143/87 (BP Location: Left Arm)   Pulse 87   Temp 98.1 F (36.7 C) (Oral)   Resp 18   Ht  (1.803 m)   Wt 124.7 kg (275 lb)   SpO2 97%   BMI 38.35 kg/m   Physical Exam  Constitutional: He is oriented to person, place, and time. He appears well-developed and well-nourished. No distress.  HENT:  Head: Normocephalic and atraumatic.  Mouth/Throat: Oropharynx is clear and moist. No oropharyngeal exudate.  Eyes: Pupils are equal, round, and reactive to light. Conjunctivae and EOM are normal.  Neck: Normal range of motion. Neck supple.  No meningismus.  Cardiovascular: Normal rate, regular rhythm, normal heart sounds and intact distal pulses.   No murmur heard. Pulmonary/Chest: Effort normal and breath sounds normal. No respiratory distress.  Abdominal: Soft. There is no tenderness. There is no rebound and no guarding.  Musculoskeletal: Normal range of motion. He exhibits edema and tenderness.  Diffuse swelling to left ankle especially over lateral malleolus. Lateral malleolar  tenderness. Intact DP and PT pulses. Ankle flexion and extension intact. Achilles intact  No proximal fibula tenderness  Neurological: He is alert and oriented to person, place, and time. No cranial nerve deficit. He exhibits normal muscle tone. Coordination normal.   5/5 strength throughout. CN 2-12 intact.Equal grip strength.   Skin: Skin is warm.  Psychiatric: He has a normal mood and affect. His behavior is normal.  Nursing note and vitals reviewed.    ED Treatments / Results  Labs (all labs ordered are listed, but only abnormal results are displayed) Labs Reviewed - No data to display  EKG  EKG Interpretation None       Radiology Dg Ankle Complete Left  Result Date: 08/21/2017 CLINICAL DATA:  Initial evaluation for acute injury, twisted ankle. EXAM: LEFT ANKLE COMPLETE - 3+ VIEW COMPARISON:  None. FINDINGS: No acute fracture or dislocation. Ankle mortise approximated. Talar dome intact. Prominent soft tissue swelling about the ankle, greatest at the lateral malleolus. Posterior plantar calcaneal enthesophytes. IMPRESSION: 1. No acute fracture or dislocation. 2. Prominent soft tissue swelling about the ankle. Electronically Signed   By: Rise Mu M.D.   On: 08/21/2017 06:58    Procedures Procedures (including critical care time)  Medications Ordered in ED Medications - No data to display   Initial Impression / Assessment and Plan / ED Course  I have reviewed the triage vital signs and the nursing notes.  Pertinent labs & imaging results that were available during my care of the patient were reviewed by me and considered in my medical decision making (see chart for details).     Patient with ankle pain after rolling it last night. No head injury. No other injuries. Neurovascularly intact  X-ray shows soft tissue swelling without fracture.  Patient given an ASO and crutches. Discussed elevation, ice, anti-inflammatories for ankle sprain. Follow-up with  PCP. Return precautions discussed.  Final Clinical Impressions(s) / ED Diagnoses   Final diagnoses:  None    New Prescriptions New Prescriptions   No medications on file     Glynn Octave, MD 08/21/17 [phone]

## 2017-08-21 NOTE — ED Triage Notes (Addendum)
Pt reports he fell from the stairs of his mothers house around 1930 last night and rolled his left ankle.

## 2017-09-01 ENCOUNTER — Ambulatory Visit (INDEPENDENT_AMBULATORY_CARE_PROVIDER_SITE_OTHER): Payer: Commercial Managed Care - PPO | Admitting: Orthopedic Surgery

## 2017-09-01 ENCOUNTER — Encounter: Payer: Self-pay | Admitting: Orthopedic Surgery

## 2017-09-01 VITALS — BP 140/94 | HR 86 | Ht 71.0 in | Wt 275.0 lb

## 2017-09-01 DIAGNOSIS — S93492A Sprain of other ligament of left ankle, initial encounter: Secondary | ICD-10-CM

## 2017-09-01 NOTE — Patient Instructions (Signed)
TED HOSE  800 MG IBUPROFEN MORNING AND EVENING  KNEE CRUTCH AS NEEDED ICE AT NIGHT

## 2017-09-01 NOTE — Progress Notes (Signed)
  NEW PATIENT OFFICE VISIT    Chief Complaint  Patient presents with  . Follow-up    ER follow up on left ankle, DOI 08-20-17.    31-YEAR-OLD MALE INJURED ON oCTOBER 11 FELL AND INJURED HIS LEFT ANKLE INITIAL X-RAYS WERE NEGATIVE HOWEVER, THE PATIENT HAD significant severe swelling and ecchymosis and bruising. He did go back to work and he's been on his feet for 12 hour days.  This bruising has gone down but he still has swelling at the end of the day  Initial pain severe compound mild dull constant around the left lateral and medial aspects of the ankle and somewhat proximal to the fibula.    Review of Systems  Constitutional: Negative for fever.  Musculoskeletal: Positive for joint pain.  Skin: Negative.   Neurological: Negative for tingling and sensory change.     History reviewed. No pertinent past medical history.  Past Surgical History:  Procedure Laterality Date  . COLONOSCOPY N/A 06/01/2014   Procedure: COLONOSCOPY;  Surgeon: Malissa HippoNajeeb U Rehman, MD;  Location: [facility];  Service: Endoscopy;  Laterality: N/A;  730  . Pilondial cyst removed    . PILONIDAL CYST EXCISION     [facility]- Dr Malvin JohnsBradford    History reviewed. No pertinent family history. Social History  Substance Use Topics  . Smoking status: Never Smoker  . Smokeless tobacco: Never Used  . Alcohol use Yes     Comment: occasional    No Known Allergies  No outpatient prescriptions have been marked as taking for the 09/01/17 encounter (Office Visit) with Vickki HearingHarrison, Julissa Browning E, MD.    BP (!) 140/94   Pulse 86   Ht 5\' 11"  (1.803 m)   Wt 275 lb (124.7 kg)   BMI 38.35 kg/m   Physical Exam  Constitutional: He is oriented to person, place, and time and well-developed, well-nourished, and in no distress.  Neurological: He is alert and oriented to person, place, and time.  Skin: Skin is warm and dry. No erythema.  Psychiatric: Mood and affect normal.    Physical Exam  Constitutional: He is oriented to  person, place, and time and well-developed, well-nourished, and in no distress.  Neurological: He is alert and oriented to person, place, and time.  Skin: Skin is warm and dry. No erythema.  Psychiatric: Mood and affect normal.   Ortho Exam  Left leg noLymphadenopathy Left ankle Tenderness over the anterior talofibular ligament and distal syndesmosis  He ambulates with a limp and a positive foot progression angle  He has normal passive range of motion negative drawer test motor exam normal skin non-ecchymotic no rash pulses are good sensation is normal  I interpret the plain films is normal,Plain films were negative  Report was negative  Encounter Diagnosis  Name Primary?  . Sprain of anterior talofibular ligament of left ankle, initial encounter Yes     PLAN:  TED hose 800 mg ibuprofen twice a day Knee crutch 4 week follow-up

## 2017-09-30 ENCOUNTER — Telehealth: Payer: Self-pay | Admitting: Orthopedic Surgery

## 2017-09-30 ENCOUNTER — Encounter: Payer: Self-pay | Admitting: Orthopedic Surgery

## 2017-09-30 ENCOUNTER — Ambulatory Visit: Payer: Self-pay | Admitting: Orthopedic Surgery

## 2017-09-30 NOTE — Telephone Encounter (Signed)
Letter sent to patient regarding missed appointment today, 09/30/17

## 2018-06-08 ENCOUNTER — Other Ambulatory Visit: Payer: Self-pay

## 2018-06-08 ENCOUNTER — Encounter (HOSPITAL_COMMUNITY): Payer: Self-pay | Admitting: Emergency Medicine

## 2018-06-08 ENCOUNTER — Emergency Department (HOSPITAL_COMMUNITY)
Admission: EM | Admit: 2018-06-08 | Discharge: 2018-06-08 | Disposition: A | Discharge status: Home / Self Care | Payer: Commercial Managed Care - PPO | Attending: Emergency Medicine | Admitting: Emergency Medicine

## 2018-06-08 DIAGNOSIS — L03116 Cellulitis of left lower limb: Secondary | ICD-10-CM | POA: Diagnosis not present

## 2018-06-08 DIAGNOSIS — M79605 Pain in left leg: Secondary | ICD-10-CM

## 2018-06-08 DIAGNOSIS — M79662 Pain in left lower leg: Secondary | ICD-10-CM | POA: Diagnosis present

## 2018-06-08 MED ORDER — DOXYCYCLINE HYCLATE 100 MG PO TABS
100.0000 mg | ORAL_TABLET | Freq: Once | ORAL | Status: AC
  Administered 2018-06-08: 100 mg via ORAL
  Filled 2018-06-08: qty 1

## 2018-06-08 MED ORDER — DOXYCYCLINE HYCLATE 100 MG PO CAPS: 100.0000 mg | ORAL_CAPSULE | Freq: Two times a day (BID) | ORAL | 0 refills | Status: DC

## 2018-06-08 MED ORDER — IBUPROFEN 800 MG PO TABS: 800.0000 mg | ORAL_TABLET | Freq: Three times a day (TID) | ORAL | 0 refills | Status: DC

## 2018-06-08 NOTE — ED Provider Notes (Signed)
[facility] Provider Note   CSN: [id number] Arrival date & time: 06/08/18  1744     History   Chief Complaint Chief Complaint  Patient presents with  . Leg Pain    HPI David Boyd is a 32 y.o. male.  HPI   David Boyd is a 32 y.o. male who presents to the Emergency Department complaining of localized area of redness and pain to his left lower leg.  He states that he struck his leg with the blunt end of a chainsaw 1 week ago.  He noticed a bruise and a small "knot" to the area that has now gotten bigger and painful.  Pain to his leg is associated with weightbearing.  He denies open wound, drainage, or history of MRSA.  He also complains of generalized fatigue and intermittent nausea.  No fever or chills.  He has not been taking over-the-counter pain relievers. He is concerned about potential DVT.   He denies swelling of his lower leg, foot pain, pain radiating proximal to the knee, chest pain or shortness of breath.  No history of DVT or PE   History reviewed. No pertinent past medical history.  Patient Active Problem List   Diagnosis Date Noted  . Unspecified constipation 05/11/2014  . Rectal pain 05/11/2014    Past Surgical History:  Procedure Laterality Date  . COLONOSCOPY N/A 06/01/2014   Procedure: COLONOSCOPY;  Surgeon: Malissa HippoNajeeb U Rehman, MD;  Location: [facility];  Service: Endoscopy;  Laterality: N/A;  730  . Pilondial cyst removed    . PILONIDAL CYST EXCISION     [facility]- Dr Malvin JohnsBradford        Home Medications    Prior to Admission medications   Medication Sig Start Date End Date Taking? Authorizing Provider  diltiazem 2 % GEL Apply 1 application topically 2 (two) times daily. 06/01/14   Malissa Hippoehman, Najeeb U, MD  docusate sodium (COLACE) 100 MG capsule Take 2 capsules (200 mg total) by mouth daily. 06/01/14   Malissa Hippoehman, Najeeb U, MD  HYDROcodone-acetaminophen (NORCO/VICODIN) 5-325 MG tablet Take 1 tablet by mouth every 4 (four) hours as needed.  12/17/16   Jacalyn LefevreHaviland, Julie, MD  ibuprofen (ADVIL,MOTRIN) 800 MG tablet Take 1 tablet (800 mg total) by mouth 3 (three) times daily. 08/21/17   Rancour, Jeannett SeniorStephen, MD  psyllium (METAMUCIL SMOOTH TEXTURE) 28 % packet Take 1 packet by mouth at bedtime. 06/01/14   Malissa Hippoehman, Najeeb U, MD    Family History History reviewed. No pertinent family history.  Social History Social History   Tobacco Use  . Smoking status: Never Smoker  . Smokeless tobacco: Never Used  Substance Use Topics  . Alcohol use: Yes    Comment: occasional  . Drug use: No     Allergies   Patient has no known allergies.   Review of Systems Review of Systems  Constitutional: Positive for fatigue. Negative for chills and fever.  Respiratory: Negative for chest tightness and shortness of breath.   Cardiovascular: Negative for chest pain.  Gastrointestinal: Negative for abdominal pain, nausea and vomiting.  Musculoskeletal: Negative for arthralgias and joint swelling.  Skin: Positive for color change. Negative for rash and wound.       Swollen, painful area of the left lower leg  Neurological: Negative for dizziness, weakness and numbness.  Hematological: Negative for adenopathy.  All other systems reviewed and are negative.    Physical Exam Updated Vital Signs BP (!) 164/64   Pulse 67   Temp  98.6 F (37 C) (Oral)   Resp 18   Ht 5\' 11"  (1.803 m)   Wt 127 kg (280 lb)   SpO2 100%   BMI 39.05 kg/m   Physical Exam  Constitutional: He appears well-developed and well-nourished. No distress.  HENT:  Head: Normocephalic and atraumatic.  Cardiovascular: Normal rate, regular rhythm and intact distal pulses.  No murmur heard. Pulmonary/Chest: Effort normal and breath sounds normal. No respiratory distress.  Musculoskeletal: Normal range of motion. He exhibits tenderness.  Focal 8 cm area of erythema and excessive warmth to the medial aspect of the left lower leg.  A quarter sized area of induration.  No fluctuance.   No drainage.  Negative Homans sign.  Neurological: He is alert. No sensory deficit.  Skin: Skin is warm and dry. Capillary refill takes less than 2 seconds. There is erythema.  Nursing note and vitals reviewed.      ED Treatments / Results  Labs (all labs ordered are listed, but only abnormal results are displayed) Labs Reviewed - No data to display  EKG None  Radiology No results found.  Procedures Procedures (including critical care time)  Medications Ordered in ED Medications - No data to display   Initial Impression / Assessment and Plan / ED Course  I have reviewed the triage vital signs and the nursing notes.  Pertinent labs & imaging results that were available during my care of the patient were reviewed by me and considered in my medical decision making (see chart for details).     Pt well appearing.  Vitals reviewed.  Ambulatory with a steady gait.  Sx's localized to the medial aspect of left lower leg that appears consistent with cellulitis and possible early abscess.  I will start pt on doxycycline and he agrees to return tomorrow for out pt venous US of the leg, elevate, warm compresses.  Leading edge of erythema marked by me.    Final Clinical Impressions(s) / ED Diagnoses   Final diagnoses:  Pain of left lower extremity  Cellulitis of left lower extremity    ED Discharge Orders    None       Rosey Bath 06/09/18 0003    Benjiman Core, MD 06/09/18 0021

## 2018-06-08 NOTE — Discharge Instructions (Addendum)
Elevate your leg and apply warm soaks or compresses on and off for 15 to 20 minutes.  Take the antibiotic as directed.  You have been scheduled to return tomorrow for an ultrasound of your left lower leg.  Call the scheduling department tomorrow morning at [phone] to arrange your appointment time.

## 2018-06-08 NOTE — ED Triage Notes (Signed)
Patient states he hit his left leg on a machine one week ago and it started as a "small knot and bruise." States now area is bigger and has redness around area. Patient ambulatory at triage with no difficulty.

## 2018-06-10 ENCOUNTER — Ambulatory Visit (HOSPITAL_COMMUNITY)
Admission: RE | Admit: 2018-06-10 | Discharge: 2018-06-10 | Disposition: A | Discharge status: Home / Self Care | Payer: Commercial Managed Care - PPO | Source: Ambulatory Visit | Attending: Emergency Medicine | Admitting: Emergency Medicine

## 2018-06-10 DIAGNOSIS — M7989 Other specified soft tissue disorders: Secondary | ICD-10-CM | POA: Diagnosis not present

## 2018-06-10 DIAGNOSIS — M79605 Pain in left leg: Secondary | ICD-10-CM | POA: Insufficient documentation

## 2018-06-10 NOTE — ED Provider Notes (Signed)
Reviewed ultrasound results.  Discussed with patient.  Patient had a chainsaw strike the area but did not break the skin last week.  It is erythematous and somewhat tender.  He has been placed on doxycycline.  I examined the area.  I discussed this could be a hematoma or an abscess.  We discussed that he should keep it elevated, compression in place and take all of the doxycycline.  We have also discussed that he should keep the area elevated and some light compression.  We discussed return precautions such as increasing redness, swelling, pain, streaking, fever, or chills.  No evidence of DVT seen on ultrasound   Margarita Grizzleay, Ulises Wolfinger, MD 06/10/18 1027

## 2018-06-12 ENCOUNTER — Other Ambulatory Visit: Payer: Self-pay

## 2018-06-12 ENCOUNTER — Emergency Department (HOSPITAL_COMMUNITY)
Admission: EM | Admit: 2018-06-12 | Discharge: 2018-06-12 | Discharge status: Against Medical Advice | Payer: Commercial Managed Care - PPO | Attending: Emergency Medicine | Admitting: Emergency Medicine

## 2018-06-12 DIAGNOSIS — L03116 Cellulitis of left lower limb: Secondary | ICD-10-CM | POA: Insufficient documentation

## 2018-06-12 DIAGNOSIS — M79662 Pain in left lower leg: Secondary | ICD-10-CM | POA: Diagnosis present

## 2018-06-12 DIAGNOSIS — M79605 Pain in left leg: Secondary | ICD-10-CM

## 2018-06-12 DIAGNOSIS — L02416 Cutaneous abscess of left lower limb: Secondary | ICD-10-CM

## 2018-06-12 MED ORDER — LIDOCAINE HCL (PF) 1 % IJ SOLN
5.0000 mL | Freq: Once | INTRAMUSCULAR | Status: DC
  Filled 2018-06-12: qty 6

## 2018-06-12 NOTE — ED Provider Notes (Signed)
[facility] EMERGENCY DEPARTMENT Provider Note   CSN: [id number] Arrival date & time: 06/12/18  2012     History   Chief Complaint Chief Complaint  Patient presents with  . Leg Pain    HPI Carlisle BeersRobert L Mccartt is a 32 y.o. male presenting for erythema and pain to the medial aspect of his left lower leg.  Patient was seen at this department on 06/08/2018 for the same problem.  Patient states that about a week ago he struck his leg with a chainsaw, he states that he did not break the skin and that he had a bruise following this injury.  Patient states that the area of bruising began getting bigger and more painful which is why he presented to the emergency department.  Patient describes his pain as throbbing/5/10 in severity.  Patient was worked up for DVT during that encounter, ultrasound was negative at that time.  Patient was prescribed doxycycline 100mg  twice daily and informed that he should follow-up if his symptoms not improve.  That was 3 days ago and he states that the area is of similar size if not somewhat bigger today.  Patient states that he has been taking his antibiotic medication as prescribed.  Patient states that he does have a follow-up appointment with his primary care provider next week.  HPI  No past medical history on file.  Patient Active Problem List   Diagnosis Date Noted  . Unspecified constipation 05/11/2014  . Rectal pain 05/11/2014    Past Surgical History:  Procedure Laterality Date  . COLONOSCOPY N/A 06/01/2014   Procedure: COLONOSCOPY;  Surgeon: Malissa HippoNajeeb U Rehman, MD;  Location: [facility];  Service: Endoscopy;  Laterality: N/A;  730  . Pilondial cyst removed    . PILONIDAL CYST EXCISION     [facility]- Dr Malvin JohnsBradford        Home Medications    Prior to Admission medications   Medication Sig Start Date End Date Taking? Authorizing Provider  diltiazem 2 % GEL Apply 1 application topically 2 (two) times daily. 06/01/14   Malissa Hippoehman, Najeeb U, MD  docusate sodium  (COLACE) 100 MG capsule Take 2 capsules (200 mg total) by mouth daily. 06/01/14   Malissa Hippoehman, Najeeb U, MD  doxycycline (VIBRAMYCIN) 100 MG capsule Take 1 capsule (100 mg total) by mouth 2 (two) times daily. 06/08/18   Triplett, Tammy, PA-C  HYDROcodone-acetaminophen (NORCO/VICODIN) 5-325 MG tablet Take 1 tablet by mouth every 4 (four) hours as needed. 12/17/16   Jacalyn LefevreHaviland, Julie, MD  ibuprofen (ADVIL,MOTRIN) 800 MG tablet Take 1 tablet (800 mg total) by mouth 3 (three) times daily. Take with food 06/08/18   Triplett, Tammy, PA-C  psyllium (METAMUCIL SMOOTH TEXTURE) 28 % packet Take 1 packet by mouth at bedtime. 06/01/14   Malissa Hippoehman, Najeeb U, MD    Family History No family history on file.  Social History Social History   Tobacco Use  . Smoking status: Never Smoker  . Smokeless tobacco: Never Used  Substance Use Topics  . Alcohol use: Yes    Comment: occasional  . Drug use: No     Allergies   Patient has no known allergies.   Review of Systems Review of Systems  Constitutional: Positive for fatigue. Negative for chills and fever.  HENT: Negative.  Negative for congestion, rhinorrhea, sore throat and trouble swallowing.   Eyes: Negative.  Negative for visual disturbance.  Respiratory: Negative.  Negative for cough and shortness of breath.   Cardiovascular: Negative.  Negative for chest  pain.  Gastrointestinal: Positive for nausea. Negative for abdominal pain, diarrhea and vomiting.  Genitourinary: Negative.  Negative for dysuria, flank pain and hematuria.  Musculoskeletal: Positive for myalgias. Negative for arthralgias, neck pain and neck stiffness.  Skin: Positive for color change.  Neurological: Negative.  Negative for dizziness, syncope, weakness, light-headedness, numbness and headaches.     Physical Exam Updated Vital Signs BP (!) 149/93 (BP Location: Right Arm)   Pulse 84   Temp 98.1 F (36.7 C) (Oral)   Resp 18   Ht 5\' 11"  (1.803 m)   Wt 127 kg (280 lb)   SpO2 100%   BMI  39.05 kg/m   Physical Exam  Constitutional: He is oriented to person, place, and time. He appears well-developed and well-nourished. No distress.  HENT:  Head: Normocephalic and atraumatic.  Right Ear: External ear normal.  Left Ear: External ear normal.  Nose: Nose normal.  Eyes: Pupils are equal, round, and reactive to light. EOM are normal.  Neck: Trachea normal and normal range of motion. No tracheal deviation present.  Pulmonary/Chest: Effort normal. No respiratory distress.  Abdominal: Soft. There is no tenderness. There is no rebound and no guarding.  Musculoskeletal: Normal range of motion.  Neurological: He is alert and oriented to person, place, and time.  Skin: Skin is warm and dry.     Tender area of erythema to the left lower leg as pictured below.   Ultrasound performed by Dr. Charm Barges shows potential 1 cm x 1 cm pocket on the superior aspect of the wound, suggestive of abscess.  Psychiatric: He has a normal mood and affect. His behavior is normal.         ED Treatments / Results  Labs (all labs ordered are listed, but only abnormal results are displayed) Labs Reviewed - No data to display  EKG None  Radiology No results found.  Procedures Procedures (including critical care time)  Medications Ordered in ED Medications  lidocaine (PF) (XYLOCAINE) 1 % injection 5 mL (has no administration in time range)     Initial Impression / Assessment and Plan / ED Course  I have reviewed the triage vital signs and the nursing notes.  Pertinent labs & imaging results that were available during my care of the patient were reviewed by me and considered in my medical decision making (see chart for details).  Clinical Course as of Jun 12 2214  Sat Jun 12, 2018  [id number] 32 year old male here for recheck of lower leg pain.  States he had a bruise or a bite there and then was here with increased pain and put on antibiotics.  He does not feel like the antibiotics are done  anything for it.  He is denies any fever.  On exam he is got a tender erythematous area on his lower leg approximately 4 cm.  He does not feel particularly fluctuant although by ultrasound it looks like there may be a pocket.  We recommended I&D in the area to see if we can get some fluid out of there.   [MB]  2140 Ultrasound performed by Dr. Charm Barges, shows potential 1 cm x 1 cm pocket of abscess in the superior aspect of the erythema.  Patient has refused incision and drainage today, I have informed him of the need of incision and drainage for resolution of his symptoms and that it is likely that his symptoms will continue to worsen without incision and drainage.  Patient states understanding however he wishes to follow-up with  his primary care provider next week.   [BM]    Clinical Course User Index [BM] Bill Salinas, PA-C [MB] Terrilee Files, MD   Patient presenting for worsening pain/erythema to his left lower leg despite doxycycline use for the past 3 days.  Ultrasound performed by Dr. Charm Barges today shows potential 1 cm x 1 similar pocket of abscess to the superior portion of the wound.  Findings were explained to patient along with incision and drainage procedure.  Patient has refused incision and drainage today.  I have spoken to the patient at length of the dangers of not having incidents and drainage performed today.  I have informed patient that it is likely that his pain/infection will continue to get worse until the area is drained.  I have informed him that refusing treatment could lead to but is not limited to systemic infection/sepsis, loss of limb or life.  Patient states understanding but again refused procedure today.    Patient informed that he will be leaving AGAINST MEDICAL ADVICE.  Patient states understanding of this and states that he wishes to follow-up with his primary care provider next week instead.  Patient states that he will continue to take his antibiotic doxycycline  and understands that he is welcome to return to the emergency department at any time he wishes.  We discussed the nature and purpose, risks and benefits, as well as, the alternatives of treatment. Time was given to allow the opportunity to ask questions and consider their options, and after the discussion, the patient decided to refuse the offerred treatment. The patient was informed that refusal could lead to, but was not limited to, death, permanent disability, or severe pain.  I asked the relatives or significant others to dissuade them without success. Prior to refusing, I determined that the patient had the capacity to make their decision and understood the consequences of that decision. After refusal, I made every reasonable opportunity to treat them to the best of my ability.  The patient was notified that they may return to the emergency department at any time for further treatment.     Final Clinical Impressions(s) / ED Diagnoses   Final diagnoses:  Cellulitis of left lower extremity  Abscess of left lower leg  Left leg pain    ED Discharge Orders    None       Elizabeth Palau 06/12/18 2217    Terrilee Files, MD 06/13/18 [phone]

## 2018-06-12 NOTE — Discharge Instructions (Addendum)
You are leaving today AGAINST MEDICAL ADVICE.   Ultrasound today shows a potential abscess in your wound today.  You have refused treatment today, I believe that it is necessary for you to undergo an incision and drainage for the pocket of abscess within your wound.  It is likely that your infection/pain will continue to worsen without incision and drainage of the abscess despite antibiotic use.   You may return to the emergency department at any time for further evaluation/treatment. Please also follow-up with your primary care provider as soon as possible regarding your visit today. Please continue to take your antibiotic medication, doxycycline, as prescribed. Please return to emergency department for any new or worsening symptoms or if your symptoms do not improve. Your blood pressure was elevated today please be sure to follow-up with your primary care provider regarding this as well.  Contact a doctor if: You have a fever. Your symptoms do not get better after 1-2 days of treatment. Your bone or joint under the infected area starts to hurt after the skin has healed. Your infection comes back. This can happen in the same area or another area. You have a swollen bump in the infected area. You have new symptoms. You feel ill and also have muscle aches and pains. Get help right away if: Your symptoms get worse. You feel very sleepy. You throw up (vomit) or have watery poop (diarrhea) for a long time. There are red streaks coming from the infected area. Your red area gets larger. Your red area turns darker.

## 2018-06-12 NOTE — ED Triage Notes (Addendum)
Pt C/O leg pain from an abscess. Pt states he had an US performed on Thursday and was told he may need to come back and have it drained. Pt denies fever, nausea, or vomiting. Pt states he is still taking the doxycycline.

## 2018-06-14 ENCOUNTER — Telehealth: Payer: Self-pay | Admitting: Orthopaedic Surgery

## 2018-06-14 NOTE — Telephone Encounter (Signed)
Patient had scheduled appointment as of 06/07/18 for "left leg pain", for 06/16/18, with Dr Hilda LiasKeeling. Since then, patient has been seen at [facility] room, and also by his primary care, Dr Charm BargesButler. The medical issue is an area/"spot" on left lower leg. Said had some tests done and it may be cellulitis.  States will be seeing his primary care provider, Dr Charm BargesButler today, 06/14/18.  Relayed that Dr Charm BargesButler will determine if he needs to be seen at our clinic or  another specialist.  If referred here, notes will be needed.  Keeping appointment for time being.  Patient voiced understanding.

## 2018-06-15 ENCOUNTER — Telehealth: Payer: Self-pay | Admitting: Orthopaedic Surgery

## 2018-06-15 NOTE — Telephone Encounter (Signed)
I called [facility] Silv[facility] and was told this is not their patient. After reading the AVS from the ER, it had for [facility] She informed me that he was seen yesterday in their office and was [facility]. I asked her if I should keep the patient scheduled or cancel the appointment. She told me to cancel it because he was [facility] yesterday and she felt they had taken care of him.

## 2018-06-16 ENCOUNTER — Ambulatory Visit: Payer: Self-pay | Admitting: Orthopaedic Surgery

## 2019-02-09 IMAGING — US US EXTREM LOW VENOUS*L*
1 series · 13 of 24 positions shown · non-contrast
Comparison: None.

CLINICAL DATA: 32-year-old male with left lower extremity pain,
redness and edema since injuring her leg 2 weeks previously.



[Series 1: us extrem low venous*left* · 0.08mm/px · 13 of 52 slices shown]
[im 1/52]
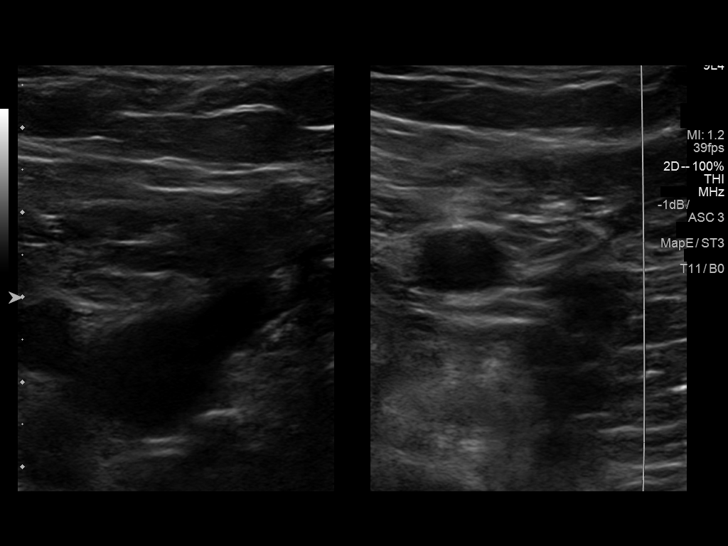
[im 5/52]
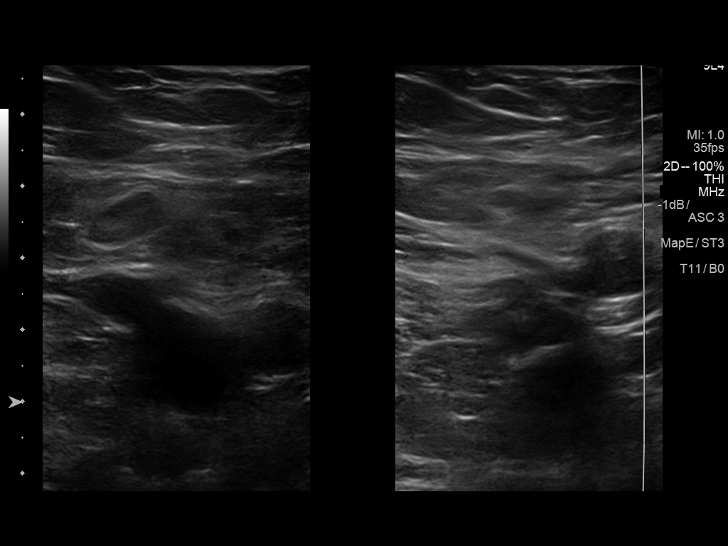
[im 9/52]
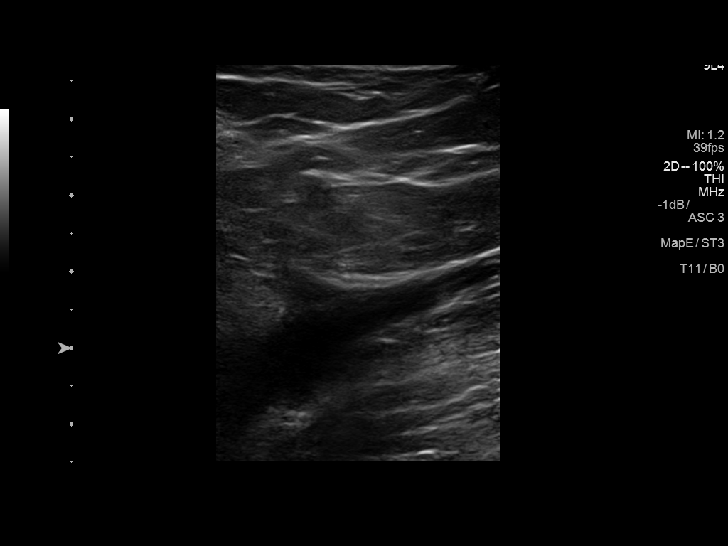
[im 14/52]
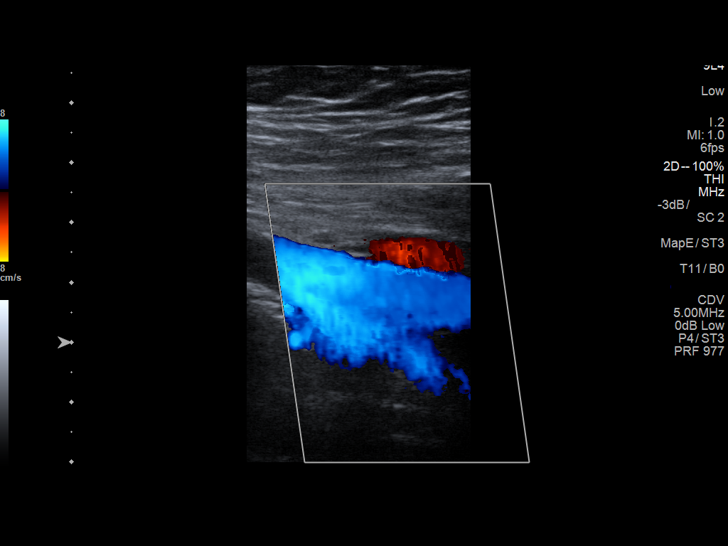
[im 18/52]
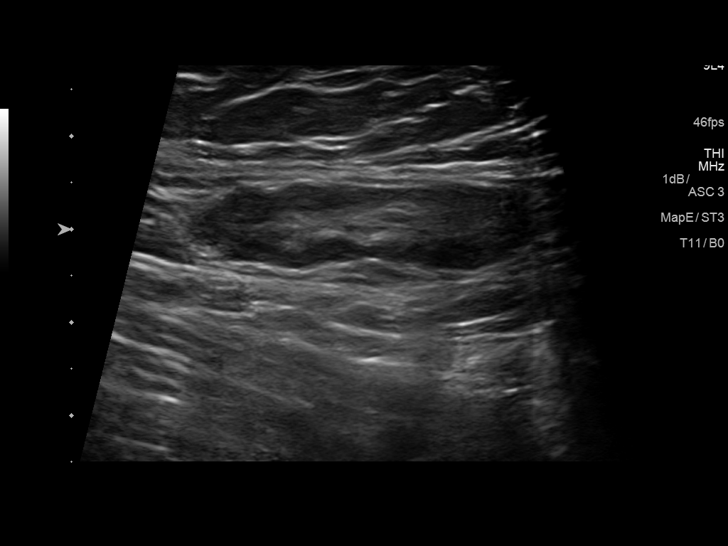
[im 23/52]
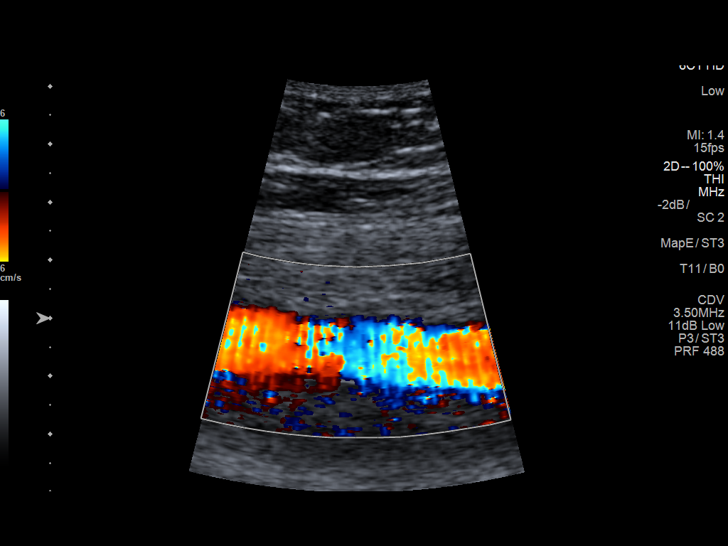
[im 27/52]
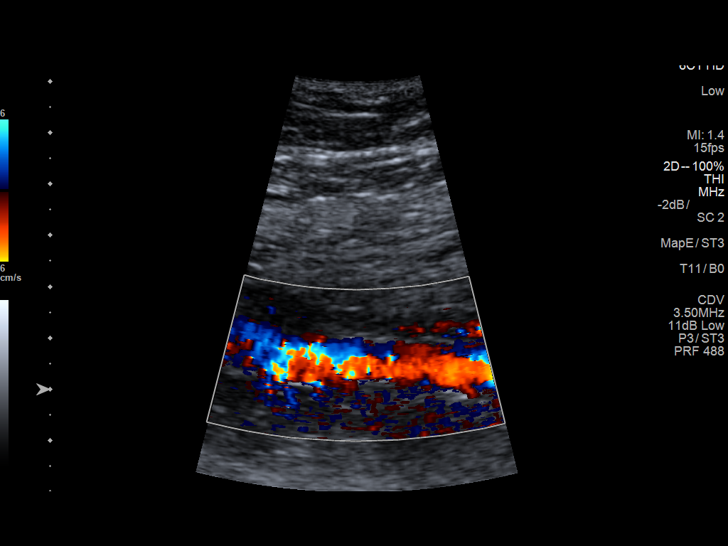
[im 29/52]
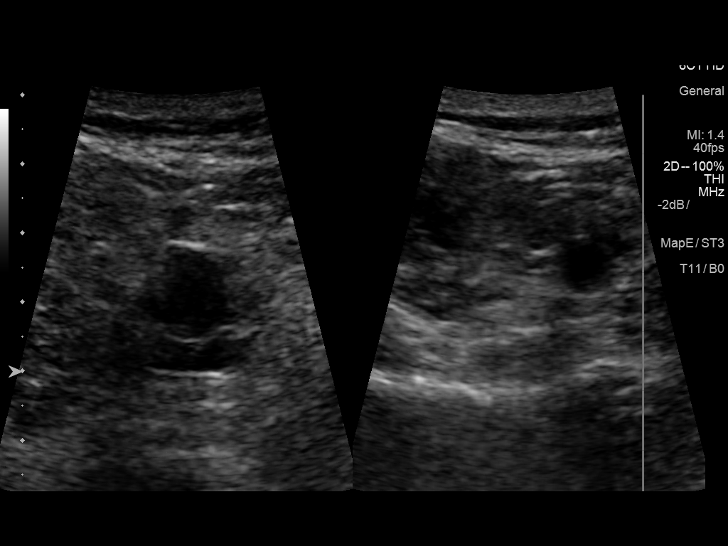
[im 34/52]
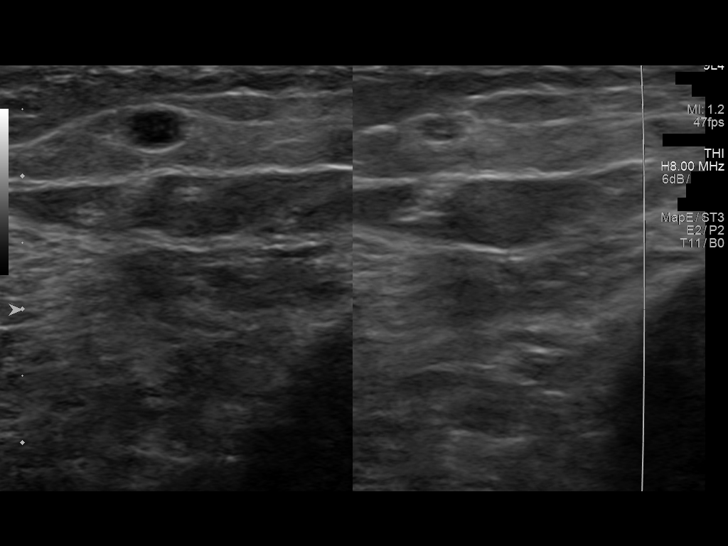
[im 38/52]
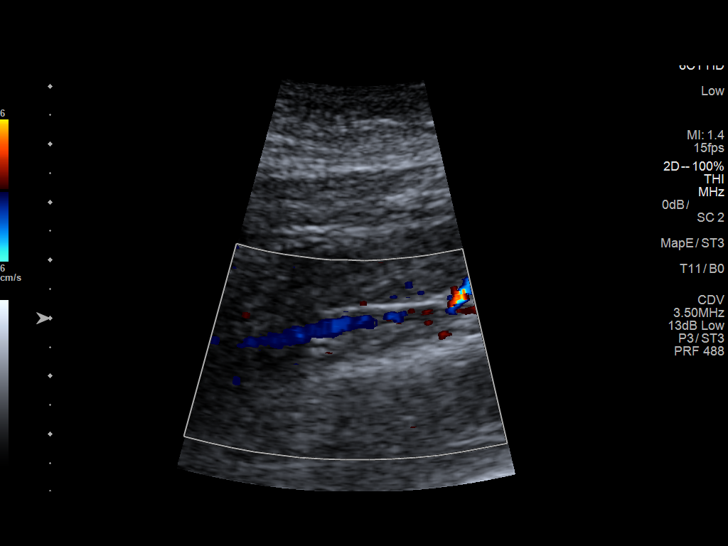
[im 43/52]
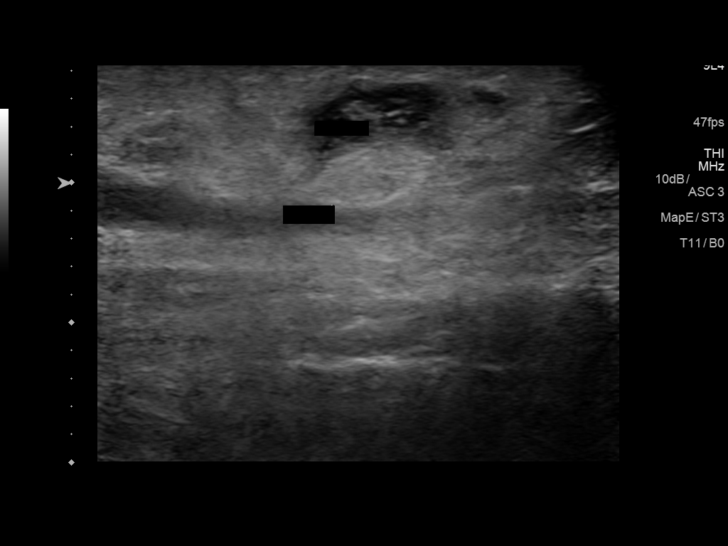
[im 47/52]
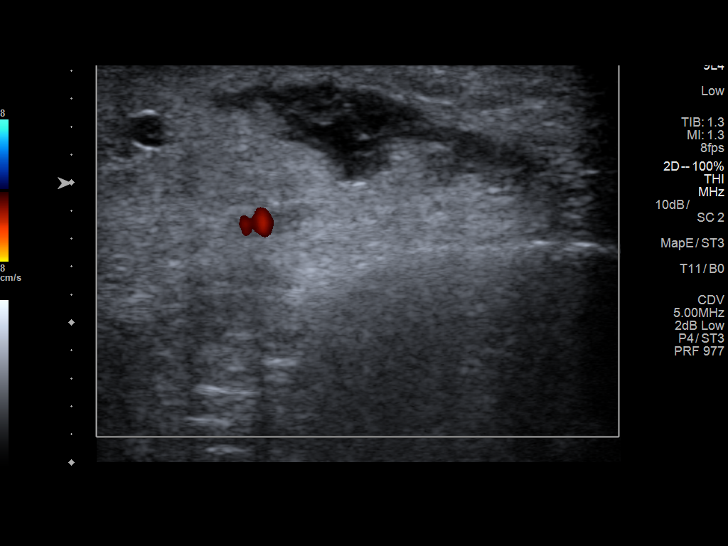
[im 52/52]
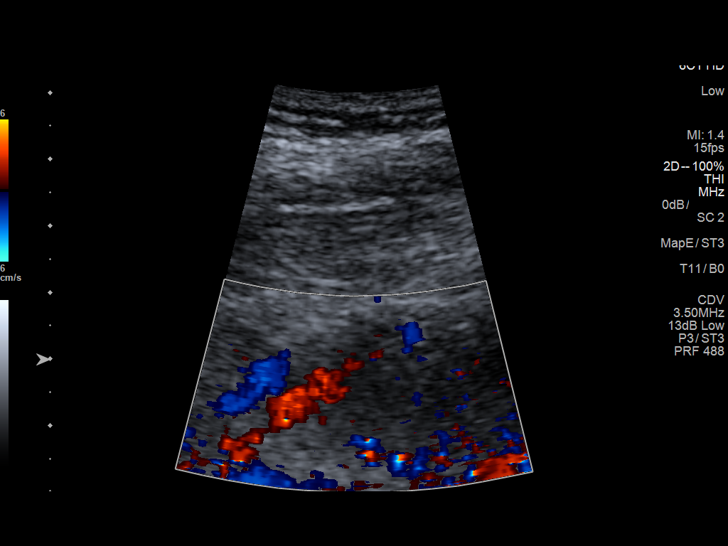

[13 of 24 positions shown; findings below may reference images not displayed]

FINDINGS: Contralateral Common Femoral Vein: Respiratory phasicity is normal
and symmetric with the symptomatic side. No evidence of thrombus.
Normal compressibility.

Common Femoral Vein: No evidence of thrombus. Normal
compressibility, respiratory phasicity and response to augmentation.

Saphenofemoral Junction: No evidence of thrombus. Normal
compressibility and flow on color Doppler imaging.

Profunda Femoral Vein: No evidence of thrombus. Normal
compressibility and flow on color Doppler imaging.

Femoral Vein: No evidence of thrombus. Normal compressibility,
respiratory phasicity and response to augmentation.

Popliteal Vein: No evidence of thrombus. Normal compressibility,
respiratory phasicity and response to augmentation.

Calf Veins: No evidence of thrombus. Normal compressibility and flow
on color Doppler imaging.

Superficial Great Saphenous Vein: No evidence of thrombus. Normal
compressibility.

Venous Reflux:  None.

Other Findings: In the superficial subcutaneous fat of the distal
lower leg there is an irregularly-shaped complex fluid collection
measuring approximately 2.1 x 0.7 x 2.5 cm. No evidence of internal
vascularity.
IMPRESSION: 1. No evidence of deep or superficial venous thrombosis.
2. In the region of clinical concern in the distal left lower
extremity there is an irregular complex fluid collection in the
superficial subcutaneous fat which measures approximately 2.1 x
x 2.5 cm. Differential considerations include evolving hematoma and,
in the appropriate clinical setting, abscess.

## 2019-08-26 ENCOUNTER — Ambulatory Visit: Payer: Self-pay | Admitting: General Surgery

## 2019-10-24 ENCOUNTER — Inpatient Hospital Stay (HOSPITAL_COMMUNITY): Admission: RE | Admit: 2019-10-24 | Payer: Commercial Managed Care - PPO | Source: Ambulatory Visit

## 2019-10-25 NOTE — Progress Notes (Signed)
Spoke with wendy at [facility] and [facility] patient did not get covid test 10-24-19 and did not return pre op nurse call

## 2019-10-26 MED ORDER — BUPIVACAINE LIPOSOME 1.3 % IJ SUSP
20.0000 mL | Freq: Once | INTRAMUSCULAR | Status: DC
  Filled 2019-10-26: qty 20

## 2019-10-26 MED ORDER — DEXTROSE 5 % IV SOLN
3.0000 g | INTRAVENOUS | Status: DC
  Filled 2019-10-26: qty 3000

## 2019-10-27 ENCOUNTER — Ambulatory Visit (HOSPITAL_BASED_OUTPATIENT_CLINIC_OR_DEPARTMENT_OTHER)
Admission: RE | Admit: 2019-10-27 | Payer: Commercial Managed Care - PPO | Source: Home / Self Care | Admitting: General Surgery

## 2019-10-27 ENCOUNTER — Encounter (HOSPITAL_BASED_OUTPATIENT_CLINIC_OR_DEPARTMENT_OTHER): Admission: RE | Payer: Self-pay | Source: Home / Self Care

## 2019-10-27 SURGERY — REPAIR, HERNIA, UMBILICAL, ADULT
Anesthesia: General

## 2020-02-21 ENCOUNTER — Ambulatory Visit: Payer: Commercial Managed Care - PPO

## 2020-02-21 ENCOUNTER — Ambulatory Visit (INDEPENDENT_AMBULATORY_CARE_PROVIDER_SITE_OTHER): Payer: Commercial Managed Care - PPO | Admitting: Orthopaedic Surgery

## 2020-02-21 ENCOUNTER — Encounter: Payer: Self-pay | Admitting: Orthopaedic Surgery

## 2020-02-21 ENCOUNTER — Other Ambulatory Visit: Payer: Self-pay

## 2020-02-21 VITALS — BP 144/84 | HR 76 | Ht 72.0 in | Wt 290.0 lb

## 2020-02-21 DIAGNOSIS — M25561 Pain in right knee: Secondary | ICD-10-CM

## 2020-02-21 DIAGNOSIS — G8929 Other chronic pain: Secondary | ICD-10-CM

## 2020-02-21 MED ORDER — NAPROXEN 500 MG PO TABS: 500.0000 mg | ORAL_TABLET | Freq: Two times a day (BID) | ORAL | 5 refills | Status: DC

## 2020-02-21 NOTE — Progress Notes (Signed)
Subjective:    Patient ID: David Boyd, male    DOB: May 20, 1986, 34 y.o.   MRN: [id number]  HPI He has had right knee pain for two to three weeks getting worse.  He has swelling and popping.  He has no giving way.  He has no trauma.  When he sits, he cannot fully flex his knee.  Often when standing, he has to wait a few seconds to fully extend the knee.  It has not given way.  When he starts walking he has no problem until he sits for a while.  He has no redness.  He has taken Aleve with help. He has used ice with help.  He has no other joint pain.   Review of Systems  Constitutional: Positive for activity change.  Musculoskeletal: Positive for arthralgias, gait problem and joint swelling.  All other systems reviewed and are negative.  For Review of Systems, all other systems reviewed and are negative.  The following is a summary of the past history medically, past history surgically, known current medicines, social history and family history.  This information is gathered electronically by the computer from prior information and documentation.  I review this each visit and have found including this information at this point in the chart is beneficial and informative.   History reviewed. No pertinent past medical history.  Past Surgical History:  Procedure Laterality Date  . COLONOSCOPY N/A 06/01/2014   Procedure: COLONOSCOPY;  Surgeon: Malissa Hippo, MD;  [facility];  Service: Endoscopy;  Laterality: N/A;  730  . Pilondial cyst removed    . PILONIDAL CYST EXCISION     [facility]- Dr Malvin Johns    No current outpatient medications on file prior to visit.   No current facility-administered medications on file prior to visit.    Social History   Socioeconomic History  . Marital status: Married    Spouse name: Not on file  . Number of children: Not on file  . Years of education: Not on file  . Highest education level: Not on file  Occupational History  . Not on  file  Tobacco Use  . Smoking status: Never Smoker  . Smokeless tobacco: Never Used  Substance and Sexual Activity  . Alcohol use: Yes    Comment: occasional  . Drug use: No  . Sexual activity: Yes    Birth control/protection: None  Other Topics Concern  . Not on file  Social History Narrative  . Not on file   Social Determinants of Health   Financial Resource Strain:   . Difficulty of Paying Living Expenses:   Food Insecurity:   . Worried About Programme researcher, broadcasting/film/video in the Last Year:   . Barista in the Last Year:   Transportation Needs:   . Freight forwarder (Medical):   Marland Kitchen Lack of Transportation (Non-Medical):   Physical Activity:   . Days of Exercise per Week:   . Minutes of Exercise per Session:   Stress:   . Feeling of Stress :   Social Connections:   . Frequency of Communication with Friends and Family:   . Frequency of Social Gatherings with Friends and Family:   . Attends Religious Services:   . Active Member of Clubs or Organizations:   . Attends Banker Meetings:   Marland Kitchen Marital Status:   Intimate Partner Violence:   . Fear of Current or Ex-Partner:   . Emotionally Abused:   .  Physically Abused:   . Sexually Abused:     History reviewed. No pertinent family history.  BP (!) 144/84   Pulse 76   Ht 6' (1.829 m)   Wt 290 lb (131.5 kg)   BMI 39.33 kg/m   Body mass index is 39.33 kg/m.      Objective:   Physical Exam Vitals and nursing note reviewed.  Constitutional:      Appearance: He is well-developed.  HENT:     Head: Normocephalic and atraumatic.  Eyes:     Conjunctiva/sclera: Conjunctivae normal.     Pupils: Pupils are equal, round, and reactive to light.  Cardiovascular:     Rate and Rhythm: Normal rate and regular rhythm.  Pulmonary:     Effort: Pulmonary effort is normal.  Abdominal:     Palpations: Abdomen is soft.  Musculoskeletal:     Cervical back: Normal range of motion and neck supple.        Legs:  Skin:    General: Skin is warm and dry.  Neurological:     Mental Status: He is alert and oriented to person, place, and time.     Cranial Nerves: No cranial nerve deficit.     Motor: No abnormal muscle tone.     Coordination: Coordination normal.     Deep Tendon Reflexes: Reflexes are normal and symmetric. Reflexes normal.  Psychiatric:        Behavior: Behavior normal.        Thought Content: Thought content normal.        Judgment: Judgment normal.    X-rays were done of the right knee, reported separately.       Assessment & Plan:   Encounter Diagnosis  Name Primary?  . Chronic pain of right knee Yes   I am concerned about possible meniscus tear.  PROCEDURE NOTE:  The patient requests injections of the right knee , verbal consent was obtained.  The right knee was prepped appropriately after time out was performed.   Sterile technique was observed and injection of 1 cc of Depo-Medrol 40 mg with several cc's of plain xylocaine. Anesthesia was provided by ethyl chloride and a 20-gauge needle was used to inject the knee area. The injection was tolerated well.  A band aid dressing was applied.  The patient was advised to apply ice later today and tomorrow to the injection sight as needed.  I will call in Naprosyn 500 po bid pc.  Return in 2 weeks.  Call if any problem.  Precautions discussed.  Consider MRI then if still having problem.  Electronically Signed Sanjuana Kava, MD 4/13/202111:01 AM

## 2020-03-08 ENCOUNTER — Ambulatory Visit: Payer: Commercial Managed Care - PPO | Admitting: Orthopaedic Surgery

## 2020-03-12 ENCOUNTER — Encounter: Payer: Self-pay | Admitting: Emergency Medicine

## 2020-03-12 ENCOUNTER — Other Ambulatory Visit: Payer: Self-pay

## 2020-03-12 ENCOUNTER — Ambulatory Visit
Admission: EM | Admit: 2020-03-12 | Discharge: 2020-03-12 | Disposition: A | Discharge status: Home / Self Care | Payer: Commercial Managed Care - PPO

## 2020-03-12 DIAGNOSIS — Z1152 Encounter for screening for COVID-19: Secondary | ICD-10-CM

## 2020-03-12 DIAGNOSIS — A084 Viral intestinal infection, unspecified: Secondary | ICD-10-CM

## 2020-03-12 MED ORDER — ONDANSETRON HCL 4 MG PO TABS: 4.0000 mg | ORAL_TABLET | Freq: Three times a day (TID) | ORAL | 0 refills | Status: DC | PRN

## 2020-03-12 NOTE — ED Provider Notes (Signed)
[facility]    CSN: [id number] Arrival date & time: 03/12/20  0931      History   Chief Complaint Chief Complaint  Patient presents with  . Abdominal Pain    HPI David Boyd is a 34 y.o. male.   Who presented to the urgent care for complaint of cough, body aches, headache, abdominal pain, nausea and diarrhea that started this morning.  Report he has total 3 stool this morning which are soft.  Denies sick exposure to COVID, flu or strep.  Denies recent travel.  Denies aggravating or alleviating symptoms.  Denies previous COVID infection.   Denies fever, chills, fatigue, nasal congestion, rhinorrhea, sore throat SOB, wheezing, chest pain, vomiting.  The history is provided by the patient. No language interpreter was used.  Abdominal Pain Associated symptoms: cough, fever and nausea     History reviewed. No pertinent past medical history.  Patient Active Problem List   Diagnosis Date Noted  . Unspecified constipation 05/11/2014  . Rectal pain 05/11/2014    Past Surgical History:  Procedure Laterality Date  . COLONOSCOPY N/A 06/01/2014   Procedure: COLONOSCOPY;  Surgeon: Malissa Hippo, MD;  Location: [facility];  Service: Endoscopy;  Laterality: N/A;  730  . Pilondial cyst removed    . PILONIDAL CYST EXCISION     [facility]- Dr Malvin Johns       Home Medications    Prior to Admission medications   Medication Sig Start Date End Date Taking? Authorizing Provider  Pseudoeph-Doxylamine-DM-APAP (NYQUIL PO) Take by mouth.   Yes [provider]  naproxen (NAPROSYN) 500 MG tablet Take 1 tablet (500 mg total) by mouth 2 (two) times daily with a meal. 02/21/20   Darreld Mclean, MD    Family History History reviewed. No pertinent family history.  Social History Social History   Tobacco Use  . Smoking status: Never Smoker  . Smokeless tobacco: Never Used  Substance Use Topics  . Alcohol use: Yes    Comment: occasional  . Drug use: No      Allergies   Patient has no known allergies.   Review of Systems Review of Systems  Constitutional: Positive for fever.  Respiratory: Positive for cough.   Gastrointestinal: Positive for abdominal pain and nausea.  Musculoskeletal: Positive for arthralgias.  Neurological: Positive for headaches.  All other systems reviewed and are negative.    Physical Exam Triage Vital Signs ED Triage Vitals  Enc Vitals Group     BP 03/12/20 0947 (!) 143/77     Pulse Rate 03/12/20 0947 99     Resp 03/12/20 0947 18     Temp 03/12/20 0947 99.9 F (37.7 C)     Temp Source 03/12/20 0947 Oral     SpO2 03/12/20 0947 94 %     Weight --      Height --      Head Circumference --      Peak Flow --      Pain Score 03/12/20 0945 8     Pain Loc --      Pain Edu? --      Excl. in GC? --    No data found.  Updated Vital Signs BP (!) 143/77 (BP Location: Right Arm)   Pulse 99   Temp 99.9 F (37.7 C) (Oral)   Resp 18   SpO2 94%   Visual Acuity Right Eye Distance:   Left Eye Distance:   Bilateral Distance:    Right Eye Near:  Left Eye Near:    Bilateral Near:     Physical Exam Vitals and nursing note reviewed.  Constitutional:      General: He is not in acute distress.    Appearance: Normal appearance. He is normal weight. He is not ill-appearing, toxic-appearing or diaphoretic.  HENT:     Head: Normocephalic.     Right Ear: Tympanic membrane, ear canal and external ear normal. There is no impacted cerumen.     Left Ear: Tympanic membrane, ear canal and external ear normal. There is no impacted cerumen.     Nose: Nose normal. No congestion.     Mouth/Throat:     Mouth: Mucous membranes are moist.     Pharynx: Oropharynx is clear. No oropharyngeal exudate or posterior oropharyngeal erythema.  Cardiovascular:     Rate and Rhythm: Normal rate and regular rhythm.     Pulses: Normal pulses.     Heart sounds: Normal heart sounds. No murmur.  Pulmonary:     Effort: Pulmonary  effort is normal. No respiratory distress.     Breath sounds: Normal breath sounds. No wheezing or rhonchi.  Chest:     Chest wall: No tenderness.  Abdominal:     General: Abdomen is flat. Bowel sounds are normal. There is no distension.     Palpations: There is no mass.     Tenderness: There is no abdominal tenderness. There is no right CVA tenderness, left CVA tenderness, guarding or rebound.     Hernia: No hernia is present.  Skin:    Capillary Refill: Capillary refill takes less than 2 seconds.  Neurological:     General: No focal deficit present.     Mental Status: He is alert and oriented to person, place, and time.      UC Treatments / Results  Labs (all labs ordered are listed, but only abnormal results are displayed) Labs Reviewed  NOVEL CORONAVIRUS, NAA    EKG   Radiology No results found.  Procedures Procedures (including critical care time)  Medications Ordered in UC Medications - No data to display  Initial Impression / Assessment and Plan / UC Course  I have reviewed the triage vital signs and the nursing notes.  Pertinent labs & imaging results that were available during my care of the patient were reviewed by me and considered in my medical decision making (see chart for details).    Patient is stable at discharge.  Symptoms likely from gastroenteritis.  We will rule out Covid.  Will prescribe Zofran for nausea advised to take Tylenol as needed for fever, body ache and headache  Final Clinical Impressions(s) / UC Diagnoses   Final diagnoses:  Viral gastroenteritis  Encounter for screening for COVID-19     Discharge Instructions     COVID testing ordered.  It will take between 2-7 days for test results.  Someone will contact you regarding abnormal results.    In the meantime: You should remain isolated in your home for 10 days from symptom onset AND greater than 24 hours after symptoms resolution (absence of fever without the use of  fever-reducing medication and improvement in respiratory symptoms), whichever is longer Get plenty of rest and push fluids Zofran prescribed for nausea May use Imodium for diarrhea Increase fluid intake Use medications daily for symptom relief Use OTC medications like ibuprofen or tylenol as needed fever or pain Call or go to the ED if you have any new or worsening symptoms such as fever, worsening cough,  shortness of breath, chest tightness, chest pain, turning blue, changes in mental status, etc...     ED Prescriptions    None     PDMP not reviewed this encounter.   Durward Parcel, FNP 03/12/20 1023

## 2020-03-12 NOTE — ED Triage Notes (Signed)
Patient woke around 3 am with abdominal pain, general body aches, denies vomiting.  Patient has had diarrhea.  Reports 3 episodes of diarrhea

## 2020-03-12 NOTE — Discharge Instructions (Addendum)
COVID testing ordered.  It will take between 2-7 days for test results.  Someone will contact you regarding abnormal results.    In the meantime: You should remain isolated in your home for 10 days from symptom onset AND greater than 24 hours after symptoms resolution (absence of fever without the use of fever-reducing medication and improvement in respiratory symptoms), whichever is longer Get plenty of rest and push fluids Zofran prescribed for nausea May use Imodium for diarrhea Increase fluid intake Use medications daily for symptom relief Use OTC medications like ibuprofen or tylenol as needed fever or pain Call or go to the ED if you have any new or worsening symptoms such as fever, worsening cough, shortness of breath, chest tightness, chest pain, turning blue, changes in mental status, etc..Marland Kitchen

## 2020-03-13 LAB — NOVEL CORONAVIRUS, NAA: SARS-CoV-2, NAA: NOT DETECTED

## 2020-03-13 LAB — SARS-COV-2, NAA 2 DAY TAT

## 2020-03-22 ENCOUNTER — Ambulatory Visit: Payer: Commercial Managed Care - PPO | Admitting: Orthopaedic Surgery

## 2020-05-10 ENCOUNTER — Other Ambulatory Visit: Payer: Self-pay

## 2020-05-10 ENCOUNTER — Encounter: Payer: Self-pay | Admitting: General Surgery

## 2020-05-10 ENCOUNTER — Ambulatory Visit (INDEPENDENT_AMBULATORY_CARE_PROVIDER_SITE_OTHER): Payer: Commercial Managed Care - PPO | Admitting: General Surgery

## 2020-05-10 VITALS — BP 138/84 | HR 76 | Temp 97.3°F | Resp 14 | Ht 72.0 in | Wt 282.0 lb

## 2020-05-10 DIAGNOSIS — K429 Umbilical hernia without obstruction or gangrene: Secondary | ICD-10-CM

## 2020-05-10 NOTE — Patient Instructions (Signed)
Ventral Hernia  A ventral hernia is a bulge of tissue from inside the abdomen that pushes through a weak area of the muscles that form the front wall of the abdomen. The tissues inside the abdomen are inside a sac (peritoneum). These tissues include the small intestine, large intestine, and the fatty tissue that covers the intestines (omentum). Sometimes, the bulge that forms a hernia contains intestines. Other hernias contain only fat. Ventral hernias do not go away without surgical treatment. There are several types of ventral hernias. You may have:  A hernia at an incision site from previous abdominal surgery (incisional hernia).  A hernia just above the belly button (epigastric hernia), or at the belly button (umbilical hernia). These types of hernias can develop from heavy lifting or straining.  A hernia that comes and goes (reducible hernia). It may be visible only when you lift or strain. This type of hernia can be pushed back into the abdomen (reduced).  A hernia that traps abdominal tissue inside the hernia (incarcerated hernia). This type of hernia does not reduce.  A hernia that cuts off blood flow to the tissues inside the hernia (strangulated hernia). The tissues can start to die if this happens. This is a very painful bulge that cannot be reduced. A strangulated hernia is a medical emergency. What are the causes? This condition is caused by abdominal tissue putting pressure on an area of weakness in the abdominal muscles. What increases the risk? The following factors may make you more likely to develop this condition:  Being male.  Being 60 or older.  Being overweight or obese.  Having had previous abdominal surgery, especially if there was an infection after surgery.  Having had an injury to the abdominal wall.  Having had several pregnancies.  Having a buildup of fluid inside the abdomen (ascites). What are the signs or symptoms? The only symptom of a ventral hernia  may be a painless bulge in the abdomen. A reducible hernia may be visible only when you strain, cough, or lift. Other symptoms may include:  Dull pain.  A feeling of pressure. Signs and symptoms of a strangulated hernia may include:  Increasing pain.  Nausea and vomiting.  Pain when pressing on the hernia.  The skin over the hernia turning red or purple.  Constipation.  Blood in the stool (feces). How is this diagnosed? This condition may be diagnosed based on:  Your symptoms.  Your medical history.  A physical exam. You may be asked to cough or strain while standing. These actions increase the pressure inside your abdomen and force the hernia through the opening in your muscles. Your health care provider may try to reduce the hernia by pressing on it.  Imaging studies, such as an ultrasound or CT scan. How is this treated? This condition is treated with surgery. If you have a strangulated hernia, surgery is done as soon as possible. If your hernia is small and not incarcerated, you may be asked to lose some weight before surgery. Follow these instructions at home:  Follow instructions from your health care provider about eating or drinking restrictions.  If you are overweight, your health care provider may recommend that you increase your activity level and eat a healthier diet.  Do not lift anything that is heavier than 10 lb (4.5 kg).  Return to your normal activities as told by your health care provider. Ask your health care provider what activities are safe for you. You may need to avoid activities   that increase pressure on your hernia.  Take over-the-counter and prescription medicines only as told by your health care provider.  Keep all follow-up visits as told by your health care provider. This is important. Contact a health care provider if:  Your hernia gets larger.  Your hernia becomes painful. Get help right away if:  Your hernia becomes increasingly  painful.  You have pain along with any of the following: ? Changes in skin color in the area of the hernia. ? Nausea. ? Vomiting. ? Fever. Summary  A ventral hernia is a bulge of tissue from inside the abdomen that pushes through a weak area of the muscles that form the front wall of the abdomen.  This condition is treated with surgery, which may be urgent depending on your hernia.  Do not lift anything that is heavier than 10 lb (4.5 kg), and follow activity instructions from your health care provider. This information is not intended to replace advice given to you by your health care provider. Make sure you discuss any questions you have with your health care provider. Document Revised: 12/09/2017 Document Reviewed: 05/18/2017 Elsevier Patient Education  2020 [employer]  

## 2020-05-11 NOTE — H&P (Signed)
David Boyd; [id number]; 07/10/1986   HPI Patient is a 34-year-old white male who is referred to my care by Dr. Terry Daniel for evaluation and treatment of an umbilical hernia.  He originally was scheduled to have the hernia fixed in [place], but it was delayed due to Covid.  He now presents for scheduling of surgery.  He states the umbilical hernia worsens with coughing or heavy straining.  No nausea or vomiting have been noted.  He currently has 0 out of 10 abdominal pain. History reviewed. No pertinent past medical history.  Past Surgical History:  Procedure Laterality Date  . COLONOSCOPY N/A 06/01/2014   Procedure: COLONOSCOPY;  Surgeon: Najeeb U Rehman, MD;  Location: [facility];  Service: Endoscopy;  Laterality: N/A;  730  . Pilondial cyst removed    . PILONIDAL CYST EXCISION     [facility]- Dr Bradford    History reviewed. No pertinent family history.  Current Outpatient Medications on File Prior to Visit  Medication Sig Dispense Refill  . naproxen (NAPROSYN) 500 MG tablet Take 1 tablet (500 mg total) by mouth 2 (two) times daily with a meal. (Patient not taking: Reported on 05/10/2020) 60 tablet 5   No current facility-administered medications on file prior to visit.    No Known Allergies  Social History   Substance and Sexual Activity  Alcohol Use Yes   Comment: occasional    Social History   Tobacco Use  Smoking Status Never Smoker  Smokeless Tobacco Never Used    Review of Systems  Constitutional: Negative.   HENT: Negative.   Eyes: Negative.   Respiratory: Negative.   Cardiovascular: Negative.   Gastrointestinal: Negative.   Genitourinary: Negative.   Musculoskeletal: Negative.   Skin: Negative.   Neurological: Negative.   Endo/Heme/Allergies: Negative.   Psychiatric/Behavioral: Negative.     Objective   Vitals:   05/10/20 1008  BP: 138/84  Pulse: 76  Resp: 14  Temp: (!) 97.3 F (36.3 C)  SpO2: 96%    Physical Exam Vitals reviewed.   Constitutional:      Appearance: Normal appearance. He is obese. He is not ill-appearing.  HENT:     Head: Normocephalic and atraumatic.  Cardiovascular:     Rate and Rhythm: Normal rate and regular rhythm.     Heart sounds: Normal heart sounds. No murmur heard.  No friction rub. No gallop.   Pulmonary:     Effort: Pulmonary effort is normal. No respiratory distress.     Breath sounds: Normal breath sounds. No stridor. No wheezing, rhonchi or rales.  Abdominal:     General: There is no distension.     Palpations: Abdomen is soft. There is no mass.     Tenderness: There is no abdominal tenderness. There is no guarding or rebound.     Hernia: A hernia is present.     Comments: Reducible small umbilical hernia.  Skin:    General: Skin is warm and dry.  Neurological:     Mental Status: He is alert and oriented to person, place, and time.     Assessment  Umbilical hernia Plan   Patient is scheduled for an umbilical herniorrhaphy with mesh on 05/21/2020.  The risks and benefits of the procedure including bleeding, infection, mesh use, the possibility of recurrence of the hernia were fully explained to the patient, who gave informed consent. 

## 2020-05-11 NOTE — Progress Notes (Signed)
David Boyd; [id number]; [birth year]   HPI Patient is a 34 year old white male who is referred to my care by Dr. Donzetta Sprung for evaluation and treatment of an umbilical hernia.  He originally was scheduled to have the hernia fixed in [place], but it was delayed due to Covid.  He now presents for scheduling of surgery.  He states the umbilical hernia worsens with coughing or heavy straining.  No nausea or vomiting have been noted.  He currently has 0 out of 10 abdominal pain. History reviewed. No pertinent past medical history.  Past Surgical History:  Procedure Laterality Date  . COLONOSCOPY N/A 06/01/2014   Procedure: COLONOSCOPY;  Surgeon: Malissa Hippo, MD;  [facility];  Service: Endoscopy;  Laterality: N/A;  730  . Pilondial cyst removed    . PILONIDAL CYST EXCISION     [facility]- Dr Malvin Johns    History reviewed. No pertinent family history.  Current Outpatient Medications on File Prior to Visit  Medication Sig Dispense Refill  . naproxen (NAPROSYN) 500 MG tablet Take 1 tablet (500 mg total) by mouth 2 (two) times daily with a meal. (Patient not taking: Reported on 05/10/2020) 60 tablet 5   No current facility-administered medications on file prior to visit.    No Known Allergies  Social History   Substance and Sexual Activity  Alcohol Use Yes   Comment: occasional    Social History   Tobacco Use  Smoking Status Never Smoker  Smokeless Tobacco Never Used    Review of Systems  Constitutional: Negative.   HENT: Negative.   Eyes: Negative.   Respiratory: Negative.   Cardiovascular: Negative.   Gastrointestinal: Negative.   Genitourinary: Negative.   Musculoskeletal: Negative.   Skin: Negative.   Neurological: Negative.   Endo/Heme/Allergies: Negative.   Psychiatric/Behavioral: Negative.     Objective   Vitals:   05/10/20 1008  BP: 138/84  Pulse: 76  Resp: 14  Temp: (!) 97.3 F (36.3 C)  SpO2: 96%    Physical Exam Vitals reviewed.   Constitutional:      Appearance: Normal appearance. He is obese. He is not ill-appearing.  HENT:     Head: Normocephalic and atraumatic.  Cardiovascular:     Rate and Rhythm: Normal rate and regular rhythm.     Heart sounds: Normal heart sounds. No murmur heard.  No friction rub. No gallop.   Pulmonary:     Effort: Pulmonary effort is normal. No respiratory distress.     Breath sounds: Normal breath sounds. No stridor. No wheezing, rhonchi or rales.  Abdominal:     General: There is no distension.     Palpations: Abdomen is soft. There is no mass.     Tenderness: There is no abdominal tenderness. There is no guarding or rebound.     Hernia: A hernia is present.     Comments: Reducible small umbilical hernia.  Skin:    General: Skin is warm and dry.  Neurological:     Mental Status: He is alert and oriented to person, place, and time.     Assessment  Umbilical hernia Plan   Patient is scheduled for an umbilical herniorrhaphy with mesh on 05/21/2020.  The risks and benefits of the procedure including bleeding, infection, mesh use, the possibility of recurrence of the hernia were fully explained to the patient, who gave informed consent.

## 2020-05-18 ENCOUNTER — Encounter (HOSPITAL_COMMUNITY)
Admission: RE | Admit: 2020-05-18 | Discharge: 2020-05-18 | Disposition: A | Discharge status: Home / Self Care | Payer: Commercial Managed Care - PPO | Source: Ambulatory Visit | Attending: General Surgery | Admitting: General Surgery

## 2020-05-18 ENCOUNTER — Other Ambulatory Visit: Payer: Self-pay

## 2020-05-18 ENCOUNTER — Other Ambulatory Visit (HOSPITAL_COMMUNITY)
Admission: RE | Admit: 2020-05-18 | Discharge: 2020-05-18 | Disposition: A | Discharge status: Home / Self Care | Payer: Commercial Managed Care - PPO | Source: Ambulatory Visit | Attending: General Surgery | Admitting: General Surgery

## 2020-05-18 DIAGNOSIS — Z20822 Contact with and (suspected) exposure to covid-19: Secondary | ICD-10-CM | POA: Diagnosis not present

## 2020-05-18 DIAGNOSIS — Z01812 Encounter for preprocedural laboratory examination: Secondary | ICD-10-CM | POA: Diagnosis present

## 2020-05-18 LAB — SARS CORONAVIRUS 2 (TAT 6-24 HRS): SARS Coronavirus 2: NEGATIVE

## 2020-05-21 ENCOUNTER — Ambulatory Visit (HOSPITAL_COMMUNITY): Payer: Commercial Managed Care - PPO | Admitting: Certified Registered"

## 2020-05-21 ENCOUNTER — Ambulatory Visit (HOSPITAL_COMMUNITY)
Admission: RE | Admit: 2020-05-21 | Discharge: 2020-05-21 | Disposition: A | Discharge status: Home / Self Care | Payer: Commercial Managed Care - PPO | Attending: General Surgery | Admitting: General Surgery

## 2020-05-21 ENCOUNTER — Encounter (HOSPITAL_COMMUNITY): Payer: Self-pay | Admitting: General Surgery

## 2020-05-21 ENCOUNTER — Other Ambulatory Visit: Payer: Self-pay

## 2020-05-21 ENCOUNTER — Encounter (HOSPITAL_COMMUNITY)
Admission: RE | Disposition: A | Discharge status: Home / Self Care | Payer: Self-pay | Source: Home / Self Care | Attending: General Surgery

## 2020-05-21 DIAGNOSIS — K429 Umbilical hernia without obstruction or gangrene: Secondary | ICD-10-CM | POA: Diagnosis present

## 2020-05-21 HISTORY — PX: UMBILICAL HERNIA REPAIR: SHX196

## 2020-05-21 SURGERY — REPAIR, HERNIA, UMBILICAL, ADULT
Anesthesia: General | Site: Abdomen

## 2020-05-21 MED ORDER — BUPIVACAINE LIPOSOME 1.3 % IJ SUSP
INTRAMUSCULAR | Status: DC | PRN
  Administered 2020-05-21: 20 mL

## 2020-05-21 MED ORDER — SODIUM CHLORIDE 0.9 % IR SOLN
Status: DC | PRN
  Administered 2020-05-21: 1

## 2020-05-21 MED ORDER — LIDOCAINE 2% (20 MG/ML) 5 ML SYRINGE
INTRAMUSCULAR | Status: AC
  Filled 2020-05-21: qty 5

## 2020-05-21 MED ORDER — ORAL CARE MOUTH RINSE: 15.0000 mL | Freq: Once | OROMUCOSAL | Status: AC

## 2020-05-21 MED ORDER — FENTANYL CITRATE (PF) 100 MCG/2ML IJ SOLN
INTRAMUSCULAR | Status: DC | PRN
  Administered 2020-05-21 (×2): 50 ug via INTRAVENOUS
  Administered 2020-05-21: 100 ug via INTRAVENOUS

## 2020-05-21 MED ORDER — PROPOFOL 10 MG/ML IV BOLUS
INTRAVENOUS | Status: AC
  Filled 2020-05-21: qty 20

## 2020-05-21 MED ORDER — CHLORHEXIDINE GLUCONATE 0.12 % MT SOLN
OROMUCOSAL | Status: AC
  Filled 2020-05-21: qty 75

## 2020-05-21 MED ORDER — KETOROLAC TROMETHAMINE 30 MG/ML IJ SOLN
30.0000 mg | Freq: Once | INTRAMUSCULAR | Status: AC
  Administered 2020-05-21: 30 mg via INTRAVENOUS
  Filled 2020-05-21: qty 1

## 2020-05-21 MED ORDER — HYDROMORPHONE HCL 1 MG/ML IJ SOLN: 0.2500 mg | INTRAMUSCULAR | Status: DC | PRN

## 2020-05-21 MED ORDER — SUCCINYLCHOLINE CHLORIDE 200 MG/10ML IV SOSY
PREFILLED_SYRINGE | INTRAVENOUS | Status: AC
  Filled 2020-05-21: qty 10

## 2020-05-21 MED ORDER — ONDANSETRON HCL 4 MG/2ML IJ SOLN: 4.0000 mg | Freq: Once | INTRAMUSCULAR | Status: DC | PRN

## 2020-05-21 MED ORDER — SUGAMMADEX SODIUM 200 MG/2ML IV SOLN
INTRAVENOUS | Status: DC | PRN
  Administered 2020-05-21: 200 mg via INTRAVENOUS

## 2020-05-21 MED ORDER — MIDAZOLAM HCL 5 MG/5ML IJ SOLN
INTRAMUSCULAR | Status: DC | PRN
  Administered 2020-05-21: 2 mg via INTRAVENOUS

## 2020-05-21 MED ORDER — CHLORHEXIDINE GLUCONATE 0.12 % MT SOLN
15.0000 mL | Freq: Once | OROMUCOSAL | Status: AC
  Administered 2020-05-21: 15 mL via OROMUCOSAL

## 2020-05-21 MED ORDER — CEFAZOLIN SODIUM-DEXTROSE 2-4 GM/100ML-% IV SOLN
2.0000 g | INTRAVENOUS | Status: DC
  Filled 2020-05-21: qty 100

## 2020-05-21 MED ORDER — PROPOFOL 10 MG/ML IV BOLUS
INTRAVENOUS | Status: DC | PRN
  Administered 2020-05-21: 200 mg via INTRAVENOUS

## 2020-05-21 MED ORDER — DEXAMETHASONE SODIUM PHOSPHATE 4 MG/ML IJ SOLN
INTRAMUSCULAR | Status: DC | PRN
  Administered 2020-05-21: 4 mg via INTRAVENOUS

## 2020-05-21 MED ORDER — HYDROCODONE-ACETAMINOPHEN 5-325 MG PO TABS: 1.0000 | ORAL_TABLET | ORAL | 0 refills | Status: DC | PRN

## 2020-05-21 MED ORDER — FENTANYL CITRATE (PF) 100 MCG/2ML IJ SOLN
INTRAMUSCULAR | Status: AC
  Filled 2020-05-21: qty 2

## 2020-05-21 MED ORDER — BUPIVACAINE LIPOSOME 1.3 % IJ SUSP
INTRAMUSCULAR | Status: AC
  Filled 2020-05-21: qty 10

## 2020-05-21 MED ORDER — SUCCINYLCHOLINE CHLORIDE 20 MG/ML IJ SOLN
INTRAMUSCULAR | Status: DC | PRN
  Administered 2020-05-21: 180 mg via INTRAVENOUS

## 2020-05-21 MED ORDER — LACTATED RINGERS IV SOLN: INTRAVENOUS | Status: DC

## 2020-05-21 MED ORDER — ONDANSETRON HCL 4 MG/2ML IJ SOLN
INTRAMUSCULAR | Status: DC | PRN
  Administered 2020-05-21: 4 mg via INTRAVENOUS

## 2020-05-21 MED ORDER — CEFAZOLIN SODIUM-DEXTROSE 2-4 GM/100ML-% IV SOLN: 2.0000 g | INTRAVENOUS | Status: DC

## 2020-05-21 MED ORDER — ONDANSETRON HCL 4 MG/2ML IJ SOLN
INTRAMUSCULAR | Status: AC
  Filled 2020-05-21: qty 2

## 2020-05-21 MED ORDER — CHLORHEXIDINE GLUCONATE CLOTH 2 % EX PADS: 6.0000 | MEDICATED_PAD | Freq: Once | CUTANEOUS | Status: DC

## 2020-05-21 MED ORDER — ROCURONIUM BROMIDE 10 MG/ML (PF) SYRINGE
PREFILLED_SYRINGE | INTRAVENOUS | Status: AC
  Filled 2020-05-21: qty 10

## 2020-05-21 MED ORDER — CEFAZOLIN SODIUM-DEXTROSE 1-4 GM/50ML-% IV SOLN
1.0000 g | INTRAVENOUS | Status: AC
  Administered 2020-05-21: 2 g via INTRAVENOUS

## 2020-05-21 MED ORDER — LIDOCAINE 2% (20 MG/ML) 5 ML SYRINGE
INTRAMUSCULAR | Status: DC | PRN
  Administered 2020-05-21: 100 mg via INTRAVENOUS

## 2020-05-21 MED ORDER — MIDAZOLAM HCL 2 MG/2ML IJ SOLN
INTRAMUSCULAR | Status: AC
  Filled 2020-05-21: qty 2

## 2020-05-21 SURGICAL SUPPLY — 38 items
ADH SKN CLS APL DERMABOND .7 (GAUZE/BANDAGES/DRESSINGS) ×1
APL PRP STRL LF DISP 70% ISPRP (MISCELLANEOUS) ×1
BLADE SURG SZ11 CARB STEEL (BLADE) ×3 IMPLANT
CHLORAPREP W/TINT 26 (MISCELLANEOUS) ×3 IMPLANT
CLOTH BEACON ORANGE TIMEOUT ST (SAFETY) ×3 IMPLANT
COVER LIGHT HANDLE STERIS (MISCELLANEOUS) ×6 IMPLANT
COVER WAND RF STERILE (DRAPES) ×3 IMPLANT
DERMABOND ADVANCED (GAUZE/BANDAGES/DRESSINGS) ×2
DERMABOND ADVANCED .7 DNX12 (GAUZE/BANDAGES/DRESSINGS) ×1 IMPLANT
ELECT REM PT RETURN 9FT ADLT (ELECTROSURGICAL) ×3
ELECTRODE REM PT RTRN 9FT ADLT (ELECTROSURGICAL) ×1 IMPLANT
GLOVE BIO SURGEON STRL SZ 6.5 (GLOVE) ×1 IMPLANT
GLOVE BIO SURGEONS STRL SZ 6.5 (GLOVE) ×1
GLOVE BIOGEL PI IND STRL 6.5 (GLOVE) IMPLANT
GLOVE BIOGEL PI IND STRL 7.0 (GLOVE) ×2 IMPLANT
GLOVE BIOGEL PI INDICATOR 6.5 (GLOVE) ×2
GLOVE BIOGEL PI INDICATOR 7.0 (GLOVE) ×4
GLOVE SURG SS PI 6.5 STRL IVOR (GLOVE) ×2 IMPLANT
GLOVE SURG SS PI 7.5 STRL IVOR (GLOVE) ×3 IMPLANT
GOWN STRL REUS W/TWL LRG LVL3 (GOWN DISPOSABLE) ×8 IMPLANT
INST SET MINOR GENERAL (KITS) ×3 IMPLANT
KIT TURNOVER KIT A (KITS) ×3 IMPLANT
MANIFOLD NEPTUNE II (INSTRUMENTS) ×3 IMPLANT
NDL HYPO 18GX1.5 BLUNT FILL (NEEDLE) ×1 IMPLANT
NEEDLE HYPO 18GX1.5 BLUNT FILL (NEEDLE) ×3 IMPLANT
NEEDLE HYPO 22GX1.5 SAFETY (NEEDLE) ×3 IMPLANT
NS IRRIG 1000ML POUR BTL (IV SOLUTION) ×3 IMPLANT
PACK MINOR (CUSTOM PROCEDURE TRAY) ×3 IMPLANT
PAD ARMBOARD 7.5X6 YLW CONV (MISCELLANEOUS) ×3 IMPLANT
PENCIL SMOKE EVACUATOR (MISCELLANEOUS) ×3 IMPLANT
SET BASIN LINEN APH (SET/KITS/TRAYS/PACK) ×3 IMPLANT
SUT ETHIBOND NAB MO 7 #0 18IN (SUTURE) ×3 IMPLANT
SUT MNCRL AB 4-0 PS2 18 (SUTURE) ×3 IMPLANT
SUT VIC AB 2-0 CT2 27 (SUTURE) ×3 IMPLANT
SUT VIC AB 3-0 SH 27 (SUTURE) ×3
SUT VIC AB 3-0 SH 27X BRD (SUTURE) ×1 IMPLANT
SUT VICRYL AB 3 0 TIES (SUTURE) IMPLANT
SYR 20ML LL LF (SYRINGE) ×6 IMPLANT

## 2020-05-21 NOTE — Discharge Instructions (Signed)
Open Hernia Repair, Adult, Care After This sheet gives you information about how to care for yourself after your procedure. Your health care provider may also give you more specific instructions. If you have problems or questions, contact your health care provider. What can I expect after the procedure? After the procedure, it is common to have:  Mild discomfort.  Slight bruising.  Minor swelling.  Pain in the abdomen. Follow these instructions at home: Incision care   Follow instructions from your health care provider about how to take care of your incision area. Make sure you: ? Wash your hands with soap and water before you change your bandage (dressing). If soap and water are not available, use hand sanitizer. ? Change your dressing as told by your health care provider. ? Leave stitches (sutures), skin glue, or adhesive strips in place. These skin closures may need to stay in place for 2 weeks or longer. If adhesive strip edges start to loosen and curl up, you may trim the loose edges. Do not remove adhesive strips completely unless your health care provider tells you to do that.  Check your incision area every day for signs of infection. Check for: ? More redness, swelling, or pain. ? More fluid or blood. ? Warmth. ? Pus or a bad smell. Activity  Do not drive or use heavy machinery while taking prescription pain medicine. Do not drive until your health care provider approves.  Until your health care provider approves: ? Do not lift anything that is heavier than 10 lb (4.5 kg). ? Do not play contact sports.  Return to your normal activities as told by your health care provider. Ask your health care provider what activities are safe. General instructions  To prevent or treat constipation while you are taking prescription pain medicine, your health care provider may recommend that you: ? Drink enough fluid to keep your urine clear or pale yellow. ? Take over-the-counter or  prescription medicines. ? Eat foods that are high in fiber, such as fresh fruits and vegetables, whole grains, and beans. ? Limit foods that are high in fat and processed sugars, such as fried and sweet foods.  Take over-the-counter and prescription medicines only as told by your health care provider.  Do not take tub baths or go swimming until your health care provider approves.  Keep all follow-up visits as told by your health care provider. This is important. Contact a health care provider if:  You develop a rash.  You have more redness, swelling, or pain around your incision.  You have more fluid or blood coming from your incision.  Your incision feels warm to the touch.  You have pus or a bad smell coming from your incision.  You have a fever or chills.  You have blood in your stool (feces).  You have not had a bowel movement in 2-3 days.  Your pain is not controlled with medicine. Get help right away if:  You have chest pain or shortness of breath.  You feel light-headed or feel faint.  You have severe pain.  You vomit and your pain is worse. This information is not intended to replace advice given to you by your health care provider. Make sure you discuss any questions you have with your health care provider. Document Revised: 10/09/2017 Document Reviewed: 04/09/2016 Elsevier Patient Education  2020 [employer]  Bupivacaine Liposomal Suspension for Injection What is this medicine? BUPIVACAINE LIPOSOMAL (bue PIV a kane LIP oh som al) is an  anesthetic. It causes loss of feeling in the skin or other tissues. It is used to prevent and to treat pain from some procedures. This medicine may be used for other purposes; ask your health care provider or pharmacist if you have questions. COMMON BRAND NAME(S): EXPAREL What should I tell my health care provider before I take this medicine? They need to know if you have any of these conditions:  G6PD deficiency  heart  disease  kidney disease  liver disease  low blood pressure  lung or breathing disease, like asthma  an unusual or allergic reaction to bupivacaine, other medicines, foods, dyes, or preservatives  pregnant or trying to get pregnant  breast-feeding How should I use this medicine? This medicine is for injection into the affected area. It is given by a health care professional in a hospital or clinic setting. Talk to your pediatrician regarding the use of this medicine in children. Special care may be needed. Overdosage: If you think you have taken too much of this medicine contact a poison control center or emergency room at once. NOTE: This medicine is only for you. Do not share this medicine with others. What if I miss a dose? This does not apply. What may interact with this medicine? This medicine may interact with the following medications:  acetaminophen  certain antibiotics like dapsone, nitrofurantoin, aminosalicylic acid, sulfonamides  certain medicines for seizures like phenobarbital, phenytoin, valproic acid  chloroquine  cyclophosphamide  flutamide  hydroxyurea  ifosfamide  metoclopramide  nitric oxide  nitroglycerin  nitroprusside  nitrous oxide  other local anesthetics like lidocaine, pramoxine, tetracaine  primaquine  quinine  rasburicase  sulfasalazine This list may not describe all possible interactions. Give your health care provider a list of all the medicines, herbs, non-prescription drugs, or dietary supplements you use. Also tell them if you smoke, drink alcohol, or use illegal drugs. Some items may interact with your medicine. What should I watch for while using this medicine? Your condition will be monitored carefully while you are receiving this medicine. Be careful to avoid injury while the area is numb, and you are not aware of pain. What side effects may I notice from receiving this medicine? Side effects that you should report  to your doctor or health care professional as soon as possible:  allergic reactions like skin rash, itching or hives, swelling of the face, lips, or tongue  seizures  signs and symptoms of a dangerous change in heartbeat or heart rhythm like chest pain; dizziness; fast, irregular heartbeat; palpitations; feeling faint or lightheaded; falls; breathing problems  signs and symptoms of methemoglobinemia such as pale, gray, or blue colored skin; headache; fast heartbeat; shortness of breath; feeling faint or lightheaded, falls; tiredness Side effects that usually do not require medical attention (report to your doctor or health care professional if they continue or are bothersome):  anxious  back pain  changes in taste  changes in vision  constipation  dizziness  fever  nausea, vomiting This list may not describe all possible side effects. Call your doctor for medical advice about side effects. You may report side effects to FDA at 1-800-FDA-1088. Where should I keep my medicine? This drug is given in a hospital or clinic and will not be stored at home. NOTE: This sheet is a summary. It may not cover all possible information. If you have questions about this medicine, talk to your doctor, pharmacist, or health care provider.  2020 Elsevier/Gold Standard (2019-08-09 10:48:23)  General Anesthesia, Adult,  Care After This sheet gives you information about how to care for yourself after your procedure. Your health care provider may also give you more specific instructions. If you have problems or questions, contact your health care provider. What can I expect after the procedure? After the procedure, the following side effects are common:  Pain or discomfort at the IV site.  Nausea.  Vomiting.  Sore throat.  Trouble concentrating.  Feeling cold or chills.  Weak or tired.  Sleepiness and fatigue.  Soreness and body aches. These side effects can affect parts of the body that  were not involved in surgery. Follow these instructions at home:  For at least 24 hours after the procedure:  Have a responsible adult stay with you. It is important to have someone help care for you until you are awake and alert.  Rest as needed.  Do not: ? Participate in activities in which you could fall or become injured. ? Drive. ? Use heavy machinery. ? Drink alcohol. ? Take sleeping pills or medicines that cause drowsiness. ? Make important decisions or sign legal documents. ? Take care of children on your own. Eating and drinking  Follow any instructions from your health care provider about eating or drinking restrictions.  When you feel hungry, start by eating small amounts of foods that are soft and easy to digest (bland), such as toast. Gradually return to your regular diet.  Drink enough fluid to keep your urine pale yellow.  If you vomit, rehydrate by drinking water, juice, or clear broth. General instructions  If you have sleep apnea, surgery and certain medicines can increase your risk for breathing problems. Follow instructions from your health care provider about wearing your sleep device: ? Anytime you are sleeping, including during daytime naps. ? While taking prescription pain medicines, sleeping medicines, or medicines that make you drowsy.  Return to your normal activities as told by your health care provider. Ask your health care provider what activities are safe for you.  Take over-the-counter and prescription medicines only as told by your health care provider.  If you smoke, do not smoke without supervision.  Keep all follow-up visits as told by your health care provider. This is important. Contact a health care provider if:  You have nausea or vomiting that does not get better with medicine.  You cannot eat or drink without vomiting.  You have pain that does not get better with medicine.  You are unable to pass urine.  You develop a skin  rash.  You have a fever.  You have redness around your IV site that gets worse. Get help right away if:  You have difficulty breathing.  You have chest pain.  You have blood in your urine or stool, or you vomit blood. Summary  After the procedure, it is common to have a sore throat or nausea. It is also common to feel tired.  Have a responsible adult stay with you for the first 24 hours after general anesthesia. It is important to have someone help care for you until you are awake and alert.  When you feel hungry, start by eating small amounts of foods that are soft and easy to digest (bland), such as toast. Gradually return to your regular diet.  Drink enough fluid to keep your urine pale yellow.  Return to your normal activities as told by your health care provider. Ask your health care provider what activities are safe for you. This information is not intended to  replace advice given to you by your health care provider. Make sure you discuss any questions you have with your health care provider. Document Revised: 10/30/2017 Document Reviewed: 06/12/2017 Elsevier Patient Education  2020 [employer].

## 2020-05-21 NOTE — Anesthesia Preprocedure Evaluation (Signed)
Anesthesia Evaluation  Patient identified by MRN, date of birth, ID band Patient awake    Reviewed: Allergy & Precautions, H&P , NPO status , Patient's Chart, lab work & pertinent test results, reviewed documented beta blocker date and time   Airway Mallampati: II  TM Distance: >3 FB Neck ROM: full    Dental no notable dental hx. (+) Teeth Intact   Pulmonary neg pulmonary ROS,    Pulmonary exam normal breath sounds clear to auscultation       Cardiovascular Exercise Tolerance: Good negative cardio ROS   Rhythm:regular Rate:Normal     Neuro/Psych negative neurological ROS  negative psych ROS   GI/Hepatic negative GI ROS, Neg liver ROS,   Endo/Other  negative endocrine ROS  Renal/GU negative Renal ROS  negative genitourinary   Musculoskeletal   Abdominal   Peds  Hematology negative hematology ROS (+)   Anesthesia Other Findings Large beard  Reproductive/Obstetrics negative OB ROS                             Anesthesia Physical Anesthesia Plan  ASA: III  Anesthesia Plan: General   Post-op Pain Management:    Induction:   PONV Risk Score and Plan: 2 and Ondansetron  Airway Management Planned:   Additional Equipment:   Intra-op Plan:   Post-operative Plan:   Informed Consent: I have reviewed the patients History and Physical, chart, labs and discussed the procedure including the risks, benefits and alternatives for the proposed anesthesia with the patient or authorized representative who has indicated his/her understanding and acceptance.     Dental Advisory Given  Plan Discussed with: CRNA  Anesthesia Plan Comments:         Anesthesia Quick Evaluation

## 2020-05-21 NOTE — Anesthesia Procedure Notes (Signed)
Procedure Name: Intubation Performed by: Brynda Peon, CRNA Pre-anesthesia Checklist: Patient identified, Emergency Drugs available, Patient being monitored, Timeout performed and Suction available Patient Re-evaluated:Patient Re-evaluated prior to induction Oxygen Delivery Method: Circle system utilized Preoxygenation: Pre-oxygenation with 100% oxygen Induction Type: IV induction Laryngoscope Size: Miller and 3 Grade View: Grade II Tube size: 7.5 mm Number of attempts: 1 Airway Equipment and Method: Stylet Placement Confirmation: ETT inserted through vocal cords under direct vision,  positive ETCO2,  CO2 detector and breath sounds checked- equal and bilateral Secured at: 23 cm Tube secured with: Tape Dental Injury: Teeth and Oropharynx as per pre-operative assessment

## 2020-05-21 NOTE — Anesthesia Postprocedure Evaluation (Signed)
Anesthesia Post Note  Patient: David Boyd  Procedure(s) Performed: HERNIA REPAIR UMBILICAL ADULT (N/A Abdomen)  Patient location during evaluation: PACU Anesthesia Type: General Level of consciousness: awake, oriented, awake and alert and patient cooperative Pain management: pain level controlled Vital Signs Assessment: post-procedure vital signs reviewed and stable Respiratory status: spontaneous breathing, respiratory function stable and nonlabored ventilation Cardiovascular status: blood pressure returned to baseline and stable Postop Assessment: no headache and no backache   No complications documented.   Last Vitals:  Vitals:   05/21/20 0807 05/21/20 0830  BP: 120/77   Pulse:    Resp:  (!) 2  Temp:    SpO2:      Last Pain:  Vitals:   05/21/20 0751  TempSrc: Oral  PainSc: 0-No pain                 Brynda Peon

## 2020-05-21 NOTE — Op Note (Signed)
Patient:  David Boyd  DOB:  [birth year]  MRN:  [id number]   Preop Diagnosis: Umbilical hernia  Postop Diagnosis: Same  Procedure: Umbilical herniorrhaphy  Surgeon: Franky Macho, MD  Anes: General endotracheal  Indications: Patient is a 34 year old white male who presents with a symptomatic umbilical hernia.  The risks and benefits of the procedure including bleeding, infection, possible mesh use, and the possibility of recurrence of the hernia were fully explained to the patient, who gave informed consent.  Procedure note: The patient was placed in the supine position.  After induction of general endotracheal anesthesia, the abdomen was prepped and draped using the usual sterile technique with ChloraPrep.  Surgical site confirmation was performed.  An infraumbilical incision was made down to the fascia.  The umbilicus was freed away from the underlying fascia.  The patient had a long umbilical stalk with a less than 1 cm hernia defect with properitoneal fat extruding in it.  This was freed away from the surrounding fascia and reduced.  Given its size, it was elected to proceed with primary closure.  This was done with 0 Ethibond sutures x3.  Care was taken to avoid any intra-abdominal bowel.  The defect was closed in a transverse fashion.  The base the umbilicus was secured back to the fascia using a 2-0 Vicryl suture.  Subcutaneous layer was reapproximated using 3-0 Vicryl interrupted sutures.  Exparel was instilled into the surrounding wound.  The skin was closed using a 4-0 Monocryl subcuticular suture.  Dermabond was applied.  All tape and needle counts were correct at the end of the procedure.  The patient was extubated in the operating room and transferred to PACU in stable condition.  Complications: None  EBL: Minimal  Specimen: None

## 2020-05-21 NOTE — Transfer of Care (Signed)
Immediate Anesthesia Transfer of Care Note  Patient: David Boyd  Procedure(s) Performed: HERNIA REPAIR UMBILICAL ADULT (N/A Abdomen)  Patient Location: PACU  Anesthesia Type:General  Level of Consciousness: awake, alert , oriented and patient cooperative  Airway & Oxygen Therapy: Patient Spontanous Breathing  Post-op Assessment: Report given to RN, Post -op Vital signs reviewed and stable and Patient moving all extremities  Post vital signs: Reviewed and stable  Last Vitals:  Vitals Value Taken Time  BP 150/90 05/21/20 1007  Temp    Pulse 85 05/21/20 1009  Resp 15 05/21/20 1009  SpO2 94 % 05/21/20 1009  Vitals shown include unvalidated device data.  Last Pain:  Vitals:   05/21/20 0751  TempSrc: Oral  PainSc: 0-No pain      Patients Stated Pain Goal: 6 (05/21/20 0751)  Complications: No complications documented.

## 2020-05-21 NOTE — Interval H&P Note (Signed)
History and Physical Interval Note:  05/21/2020 8:37 AM  David Boyd  has presented today for surgery, with the diagnosis of Umbilical hernia.  The various methods of treatment have been discussed with the patient and family. After consideration of risks, benefits and other options for treatment, the patient has consented to  Procedure(s): HERNIA REPAIR UMBILICAL ADULT (N/A) as a surgical intervention.  The patient's history has been reviewed, patient examined, no change in status, stable for surgery.  I have reviewed the patient's chart and labs.  Questions were answered to the patient's satisfaction.     Franky Macho

## 2020-05-22 ENCOUNTER — Encounter (HOSPITAL_COMMUNITY): Payer: Self-pay | Admitting: General Surgery

## 2020-05-22 ENCOUNTER — Telehealth: Payer: Self-pay | Admitting: Family Medicine

## 2020-05-22 NOTE — Telephone Encounter (Signed)
Wife called back stating that she spoke to her husband and gave recommendation and he states that the pain is not in his chest but more so in his torso area. Plus his throat is hurting. Information given about the symptoms of a heart attack and that is he develops those sxs to go to the ER immediately. She verbalized understanding. Informed her that his torso pain is prob d/t his surgery and will resovle with time and his throat is also s/t surgery and being put to sleep.

## 2020-05-22 NOTE — Telephone Encounter (Signed)
Pt's wife called and states that he is hurting up through his chest and shoulder area and wanted to know what to do? He had a unbiblical hernia repair yesterday and this just started within the last hour.  Spoke to Dr. Henreitta Leber and she recommended that he go to the ER for evaluation to R/O MI. Pt's wife verbalized understanding.

## 2020-05-26 ENCOUNTER — Other Ambulatory Visit: Payer: Self-pay

## 2020-05-26 ENCOUNTER — Emergency Department (HOSPITAL_COMMUNITY)
Admission: EM | Admit: 2020-05-26 | Discharge: 2020-05-27 | Disposition: A | Discharge status: Home / Self Care | Payer: Commercial Managed Care - PPO | Attending: Emergency Medicine | Admitting: Emergency Medicine

## 2020-05-26 ENCOUNTER — Encounter (HOSPITAL_COMMUNITY): Payer: Self-pay | Admitting: Emergency Medicine

## 2020-05-26 DIAGNOSIS — R0789 Other chest pain: Secondary | ICD-10-CM | POA: Insufficient documentation

## 2020-05-26 MED ORDER — SODIUM CHLORIDE 0.9% FLUSH: 3.0000 mL | Freq: Once | INTRAVENOUS | Status: DC

## 2020-05-26 NOTE — ED Triage Notes (Signed)
Pt C/O chest pain X 2 days. Pt also reports SOB. Pt had recent hernia repair on Monday.

## 2020-05-27 ENCOUNTER — Emergency Department (HOSPITAL_COMMUNITY): Payer: Commercial Managed Care - PPO

## 2020-05-27 LAB — BASIC METABOLIC PANEL
Anion gap: 10 (ref 5–15)
BUN: 22 mg/dL — ABNORMAL HIGH (ref 6–20)
CO2: 28 mmol/L (ref 22–32)
Calcium: 9.2 mg/dL (ref 8.9–10.3)
Chloride: 101 mmol/L (ref 98–111)
Creatinine, Ser: 0.83 mg/dL (ref 0.61–1.24)
GFR calc Af Amer: >60 mL/min (ref >60)
GFR calc non Af Amer: >60 mL/min (ref >60)
Glucose, Bld: 108 mg/dL — ABNORMAL HIGH (ref 70–99)
Potassium: 3.7 mmol/L (ref 3.5–5.1)
Sodium: 139 mmol/L (ref 135–145)

## 2020-05-27 LAB — CBC
HCT: 45.6 % (ref 39.0–52.0)
Hemoglobin: 15.7 g/dL (ref 13.0–17.0)
MCH: 30.1 pg (ref 26.0–34.0)
MCHC: 34.4 g/dL (ref 30.0–36.0)
MCV: 87.4 fL (ref 80.0–100.0)
Platelets: 230 10*3/uL (ref 150–400)
RBC: 5.22 MIL/uL (ref 4.22–5.81)
RDW: 12.2 % (ref 11.5–15.5)
WBC: 8.3 10*3/uL (ref 4.0–10.5)
nRBC: 0 % (ref 0.0–0.2)

## 2020-05-27 LAB — TROPONIN I (HIGH SENSITIVITY)
Troponin I (High Sensitivity): 2 ng/L (ref <18)
Troponin I (High Sensitivity): 3 ng/L (ref <18)

## 2020-05-27 LAB — D-DIMER, QUANTITATIVE: D-Dimer, Quant: 0.32 ug/mL-FEU (ref 0.00–0.50)

## 2020-05-27 IMAGING — DX DG CHEST 2V
2 series · 2 of 2 positions shown · non-contrast
Comparison: [DATE]

CLINICAL DATA: Chest pain x2 days.

EXAM:
CHEST - 2 VIEW

[chest pa]
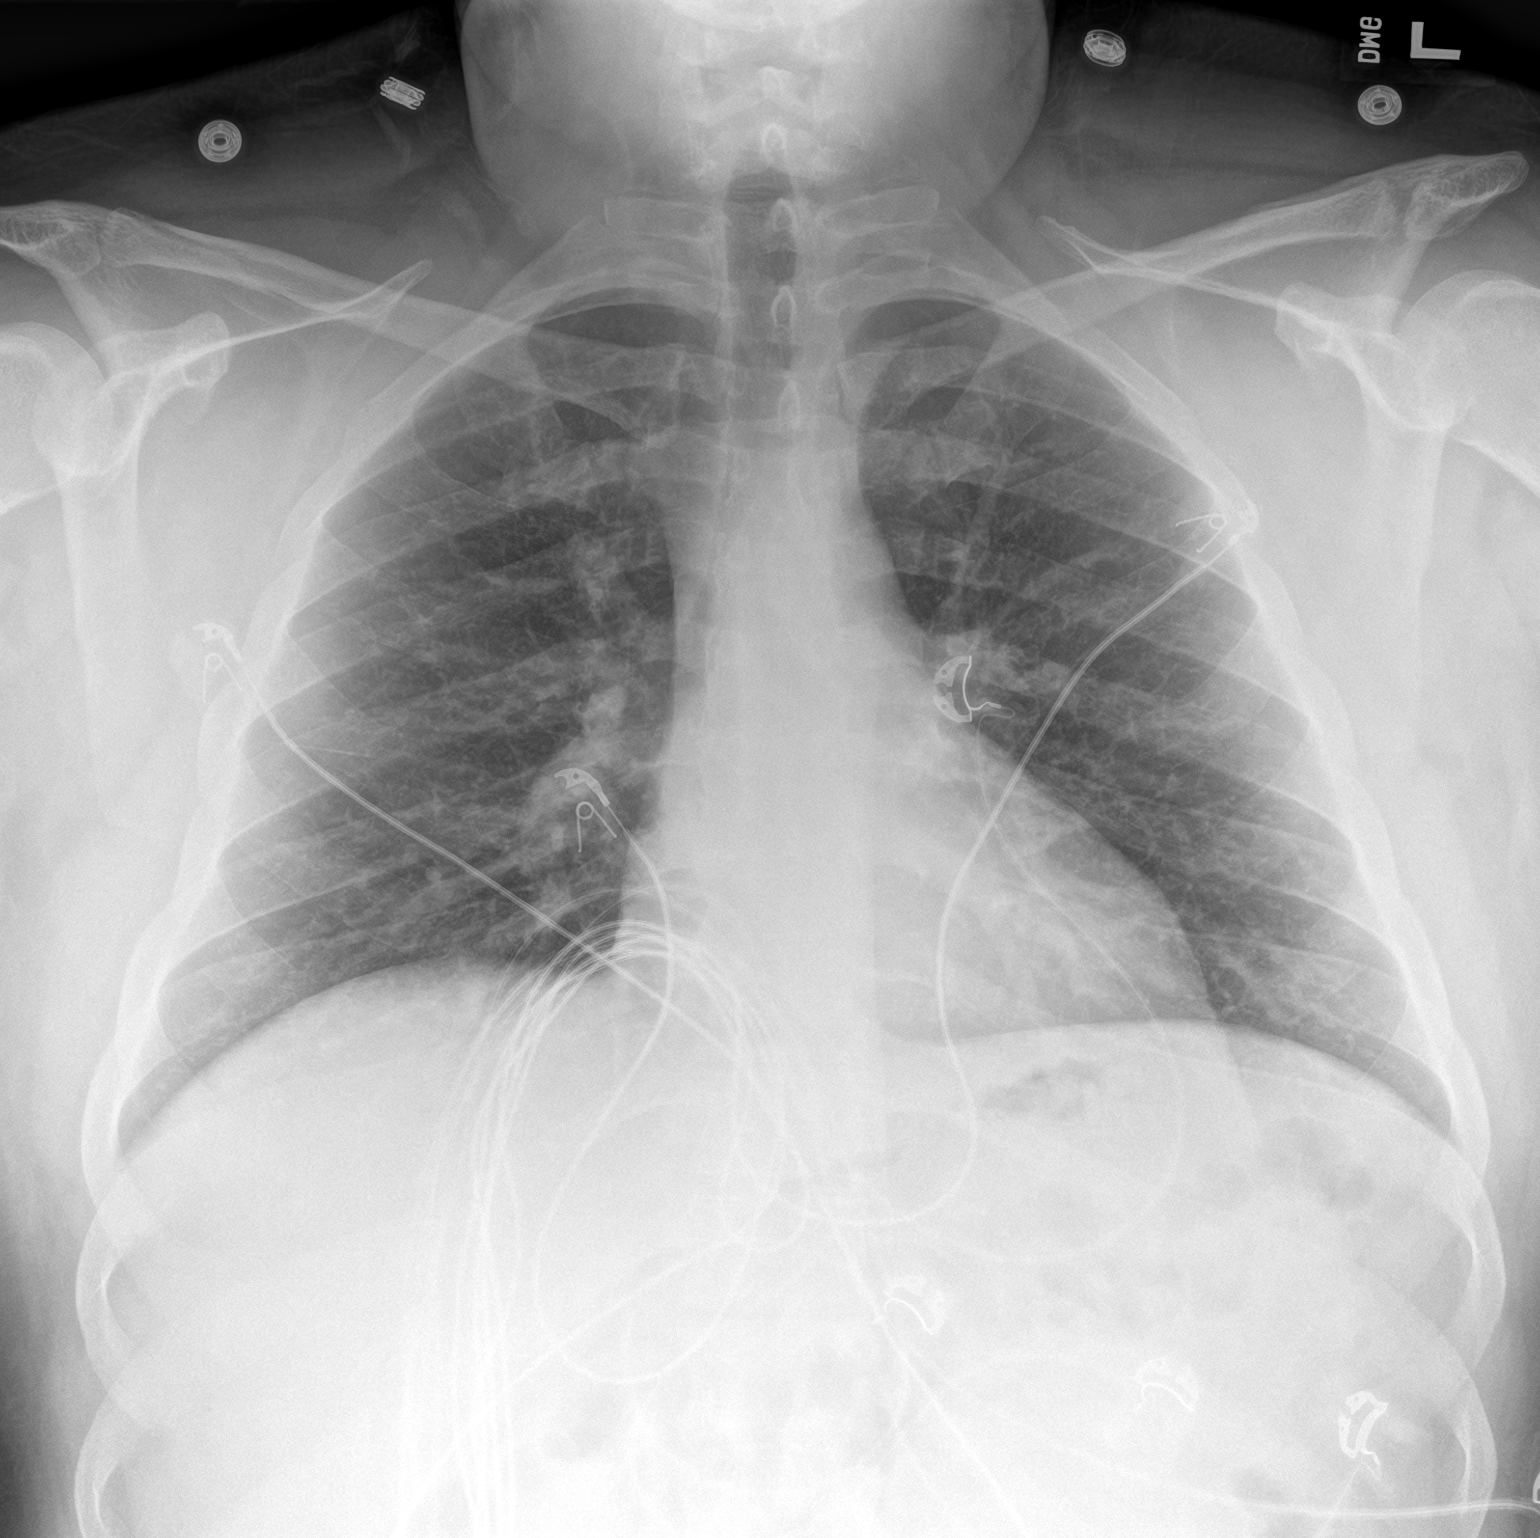

[chest lat]
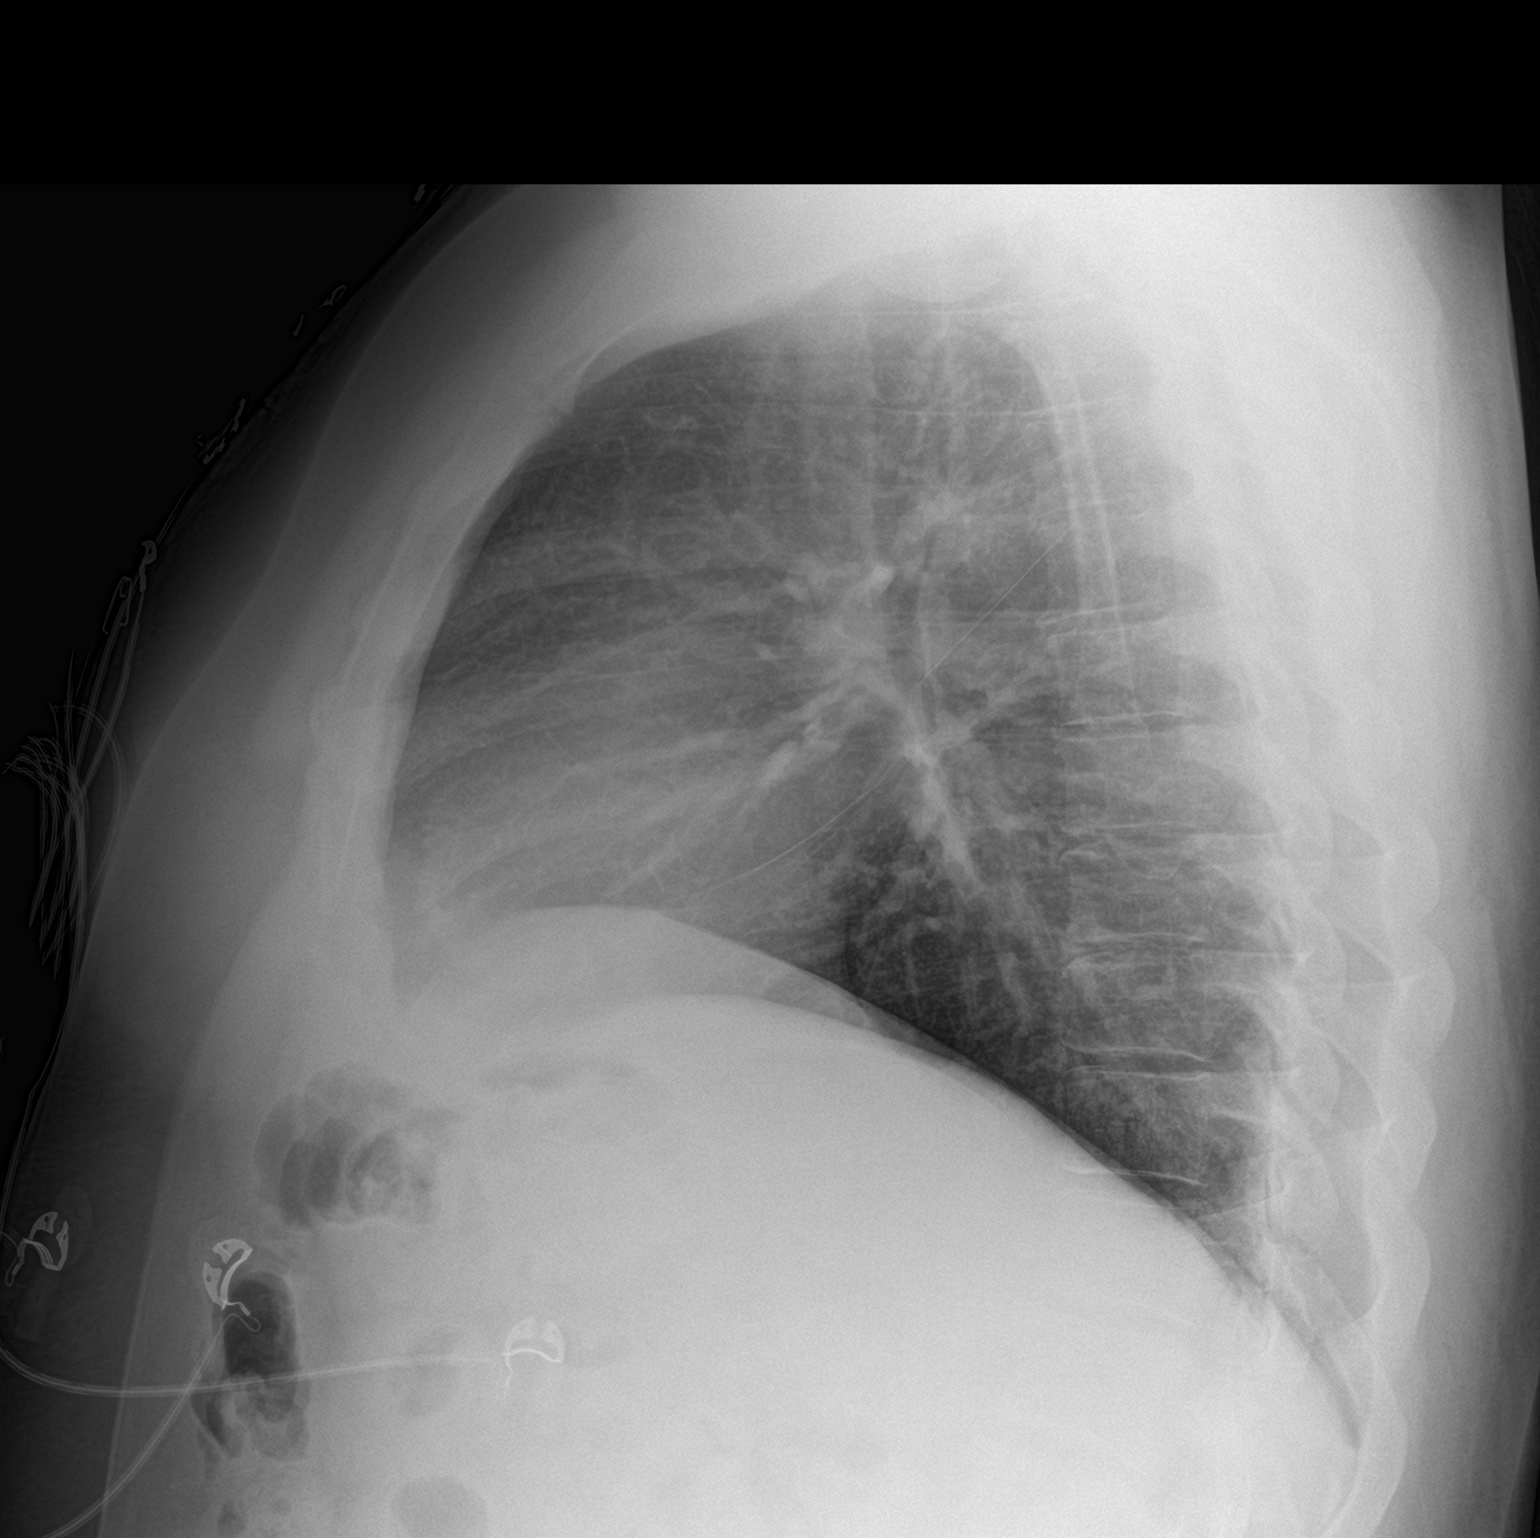

[2 of 2 positions shown; findings below may reference images not displayed]

FINDINGS: There is no evidence of acute infiltrate, pleural effusion or
pneumothorax. The heart size and mediastinal contours are within
normal limits. The visualized skeletal structures are unremarkable.
IMPRESSION: No active cardiopulmonary disease.

## 2020-05-27 MED ORDER — OMEPRAZOLE 20 MG PO CPDR
20.0000 mg | DELAYED_RELEASE_CAPSULE | Freq: Every day | ORAL | 0 refills | Status: DC
Start: 2020-05-27 — End: 2022-01-20

## 2020-05-27 MED ORDER — ALUM & MAG HYDROXIDE-SIMETH 200-200-20 MG/5ML PO SUSP
30.0000 mL | Freq: Once | ORAL | Status: AC
  Administered 2020-05-27: 30 mL via ORAL
  Filled 2020-05-27: qty 30

## 2020-05-27 MED ORDER — LIDOCAINE VISCOUS HCL 2 % MT SOLN
15.0000 mL | Freq: Once | OROMUCOSAL | Status: AC
  Administered 2020-05-27: 15 mL via ORAL
  Filled 2020-05-27: qty 15

## 2020-05-27 NOTE — ED Notes (Signed)
Pt tolerated PO Fluid Challenge without complication. MD Notified.

## 2020-05-27 NOTE — ED Notes (Signed)
Pt transported to radiology.

## 2020-05-27 NOTE — ED Notes (Signed)
ED Provider at bedside. 

## 2020-05-27 NOTE — ED Provider Notes (Signed)
[facility] EMERGENCY DEPARTMENT Provider Note   CSN: [id number] Arrival date & time: 05/26/20  2343     History Chief Complaint  Patient presents with  . Chest Pain    David Boyd is a 34 y.o. male.  Patient presents with pressure and tightness in his chest with shortness of breath that onset about 9 PM.  Reports has had this pain on and off for the past 3 days including today and seems to be worse at night and worse when he lies down.  Pain lasts for several hours at a time and seems to radiate to the right side of his neck.  It is associated with some shortness of breath.  Some nausea but no vomiting.  No cough or fever.  No diaphoresis.  Pain is not exertional or pleuritic.  Patient underwent umbilical hernia repair surgery on July 12 and started having this pain 2 or 3 days later.  He has not had this pain in the past.  He states he was diagnosed with an ulcer several years ago but does not take any medication for his stomach on a regular basis.  Denies any cardiac history.  States his abdominal pain is well controlled.  No vomiting or fever.  No throat pain.  He comes in tonight because he was having increased pain and pressure in his chest when he lies down, he called his surgeon and was told to come to the hospital.  The history is provided by the patient and the spouse.  Chest Pain Associated symptoms: shortness of breath   Associated symptoms: no abdominal pain, no dizziness, no fever, no headache, no nausea, no vomiting and no weakness        History reviewed. No pertinent past medical history.  Patient Active Problem List   Diagnosis Date Noted  . Umbilical hernia without obstruction and without gangrene   . Unspecified constipation 05/11/2014  . Rectal pain 05/11/2014    Past Surgical History:  Procedure Laterality Date  . COLONOSCOPY N/A 06/01/2014   Procedure: COLONOSCOPY;  Surgeon: Malissa Hippo, MD;  Location: [facility];  Service: Endoscopy;   Laterality: N/A;  730  . Pilondial cyst removed    . PILONIDAL CYST EXCISION     [facility]- Dr Malvin Johns  . UMBILICAL HERNIA REPAIR N/A 05/21/2020   Procedure: HERNIA REPAIR UMBILICAL ADULT;  Surgeon: Franky Macho, MD;  Location: [facility] ORS;  Service: General;  Laterality: N/A;       No family history on file.  Social History   Tobacco Use  . Smoking status: Never Smoker  . Smokeless tobacco: Never Used  Substance Use Topics  . Alcohol use: Yes    Comment: occasional  . Drug use: No    Home Medications Prior to Admission medications   Medication Sig Start Date End Date Taking? Authorizing Provider  HYDROcodone-acetaminophen (NORCO) 5-325 MG tablet Take 1 tablet by mouth every 4 (four) hours as needed for moderate pain. 05/21/20   Franky Macho, MD    Allergies    Patient has no known allergies.  Review of Systems   Review of Systems  Constitutional: Negative for activity change, appetite change and fever.  HENT: Negative for congestion and rhinorrhea.   Respiratory: Positive for chest tightness and shortness of breath.   Cardiovascular: Positive for chest pain.  Gastrointestinal: Negative for abdominal pain, nausea and vomiting.  Genitourinary: Negative for dysuria and hematuria.  Musculoskeletal: Negative for arthralgias and myalgias.  Skin: Negative for rash.  Neurological: Negative for dizziness, weakness and headaches.   all other systems are negative except as noted in the HPI and PMH.    Physical Exam Updated Vital Signs BP (!) 146/109   Pulse 89   Resp 15   Ht 6' (1.829 m)   Wt 127 kg   SpO2 100%   BMI 37.97 kg/m   Physical Exam Vitals and nursing note reviewed.  Constitutional:      General: He is not in acute distress.    Appearance: He is well-developed. He is obese.  HENT:     Head: Normocephalic and atraumatic.     Mouth/Throat:     Pharynx: No oropharyngeal exudate.  Eyes:     Conjunctiva/sclera: Conjunctivae normal.     Pupils: Pupils are equal,  round, and reactive to light.  Neck:     Comments: No meningismus. Cardiovascular:     Rate and Rhythm: Normal rate and regular rhythm.     Heart sounds: Normal heart sounds. No murmur heard.   Pulmonary:     Effort: Pulmonary effort is normal. No respiratory distress.     Breath sounds: Normal breath sounds.  Chest:     Chest wall: No tenderness.  Abdominal:     Palpations: Abdomen is soft.     Tenderness: There is no abdominal tenderness. There is no guarding or rebound.     Comments: Umbilical surgical incision appears to be healing well  Musculoskeletal:        General: No tenderness. Normal range of motion.     Cervical back: Normal range of motion and neck supple.  Skin:    General: Skin is warm.  Neurological:     Mental Status: He is alert and oriented to person, place, and time.     Cranial Nerves: No cranial nerve deficit.     Motor: No abnormal muscle tone.     Coordination: Coordination normal.     Comments: No ataxia on finger to nose bilaterally. No pronator drift. 5/5 strength throughout. CN 2-12 intact.Equal grip strength. Sensation intact.   Psychiatric:        Behavior: Behavior normal.     ED Results / Procedures / Treatments   Labs (all labs ordered are listed, but only abnormal results are displayed) Labs Reviewed  BASIC METABOLIC PANEL - Abnormal; Notable for the following components:      Result Value   Glucose, Bld 108 (*)    BUN 22 (*)    All other components within normal limits  CBC  D-DIMER, QUANTITATIVE (NOT AT [facility])  TROPONIN I (HIGH SENSITIVITY)  TROPONIN I (HIGH SENSITIVITY)    EKG EKG Interpretation  Date/Time:  Saturday May 26 2020 23:52:46 EDT Ventricular Rate:  84 PR Interval:    QRS Duration: 111 QT Interval:  361 QTC Calculation: 427 R Axis:   87 Text Interpretation: Sinus rhythm Interpretation limited secondary to artifact No previous ECGs available Confirmed by Glynn Octave [phone]) on 05/27/2020 12:04:14  AM   Radiology DG Chest 2 View  Result Date: 05/27/2020 CLINICAL DATA:  Chest pain x2 days. EXAM: CHEST - 2 VIEW COMPARISON:  November 20, 2010 FINDINGS: There is no evidence of acute infiltrate, pleural effusion or pneumothorax. The heart size and mediastinal contours are within normal limits. The visualized skeletal structures are unremarkable. IMPRESSION: No active cardiopulmonary disease. Electronically Signed   By: Aram Candela M.D.   On: 05/27/2020 00:30    Procedures Procedures (including critical care time)  Medications Ordered in  ED Medications  sodium chloride flush (NS) 0.9 % injection 3 mL (3 mLs Intravenous Not Given 05/27/20 0013)  alum & mag hydroxide-simeth (MAALOX/MYLANTA) 200-200-20 MG/5ML suspension 30 mL (has no administration in time range)    And  lidocaine (XYLOCAINE) 2 % viscous mouth solution 15 mL (has no administration in time range)    ED Course  I have reviewed the triage vital signs and the nursing notes.  Pertinent labs & imaging results that were available during my care of the patient were reviewed by me and considered in my medical decision making (see chart for details).    MDM Rules/Calculators/A&P                         Intermittent chest pain or shortness of breath for the past 3 nights. it is worse when he lies down.  Recent umbilical hernia repair surgery 6 days ago.  EKG is sinus rhythm. Low suspicion for ACS.  Consider GI etiology of the pain.  Will rule out pulmonary embolism given her recent surgery.  Troponin is negative.  D-dimer is negative.  Low suspicion for PE.  Favor GI origin of the pain given that is coming and going nature, worse with lying down.  Patient states he has had EGD in the past but records not available.  Patient did have some improvement with GI cocktail.  Troponin is negative x2.  Heart score is 1.  Low suspicion for ACS, pulmonary embolism, aortic dissection.  We will start PPI.  Avoid alcohol, caffeine,  NSAIDs, spicy food. Return to the ED if chest pain becomes exertional, associated with shortness of breath, nausea, vomiting, diaphoresis, any other concerns. Final Clinical Impression(s) / ED Diagnoses Final diagnoses:  Atypical chest pain    Rx / DC Orders ED Discharge Orders    None       Jailine Lieder, Jeannett Senior, MD 05/27/20 [phone]

## 2020-05-27 NOTE — Discharge Instructions (Signed)
There is no evidence of heart attack or blood clot in the lung.  Take the stomach medication as prescribed.  Avoid alcohol, caffeine, spicy foods, NSAID medications such as ibuprofen or aspirin.  You should follow-up with a gastroenterologist to have an EGD study.  Follow-up with your surgeon Dr. Lovell Sheehan as scheduled.  Return to the ED for chest pain, exertional, associated shortness of breath, nausea, vomiting, sweating, any other concerns.

## 2020-05-28 ENCOUNTER — Telehealth: Payer: Self-pay | Admitting: Family Medicine

## 2020-05-28 NOTE — Telephone Encounter (Signed)
Confirmation received.

## 2020-05-28 NOTE — Telephone Encounter (Signed)
FMLA paperwork completed and faxed to [facility] at [phone].

## 2020-05-30 ENCOUNTER — Telehealth (INDEPENDENT_AMBULATORY_CARE_PROVIDER_SITE_OTHER): Payer: Self-pay | Admitting: General Surgery

## 2020-05-30 DIAGNOSIS — Z09 Encounter for follow-up examination after completed treatment for conditions other than malignant neoplasm: Secondary | ICD-10-CM

## 2020-05-30 NOTE — Telephone Encounter (Signed)
Postoperative telephone visit performed.  Patient is doing well.  He has some slight swelling just below the umbilicus.  I told him that he had a fat pad there and that this was removed.  Most likely that swelling should resolve with time.  He may be trying to go back to work sooner than August 8.  He will stop by my office next week should he need a note.  Telephone time was 7 minutes.

## 2020-11-26 ENCOUNTER — Ambulatory Visit (INDEPENDENT_AMBULATORY_CARE_PROVIDER_SITE_OTHER): Payer: Commercial Managed Care - PPO

## 2020-11-26 ENCOUNTER — Other Ambulatory Visit: Payer: Self-pay

## 2020-11-26 ENCOUNTER — Encounter: Payer: Self-pay | Admitting: Emergency Medicine

## 2020-11-26 ENCOUNTER — Ambulatory Visit
Admission: EM | Admit: 2020-11-26 | Discharge: 2020-11-26 | Disposition: A | Discharge status: Home / Self Care | Payer: Commercial Managed Care - PPO | Attending: Family Medicine | Admitting: Family Medicine

## 2020-11-26 DIAGNOSIS — L03012 Cellulitis of left finger: Secondary | ICD-10-CM | POA: Diagnosis not present

## 2020-11-26 DIAGNOSIS — M25562 Pain in left knee: Secondary | ICD-10-CM

## 2020-11-26 DIAGNOSIS — M1712 Unilateral primary osteoarthritis, left knee: Secondary | ICD-10-CM

## 2020-11-26 IMAGING — DX DG KNEE COMPLETE 4+V*L*
4 series · 4 of 4 positions shown · non-contrast
Comparison: Prior radiograph from [DATE].

CLINICAL DATA: Initial evaluation for persistent knee pain status
post injury 2 weeks ago.

EXAM:
LEFT KNEE - COMPLETE 4+ VIEW

[knee ap]
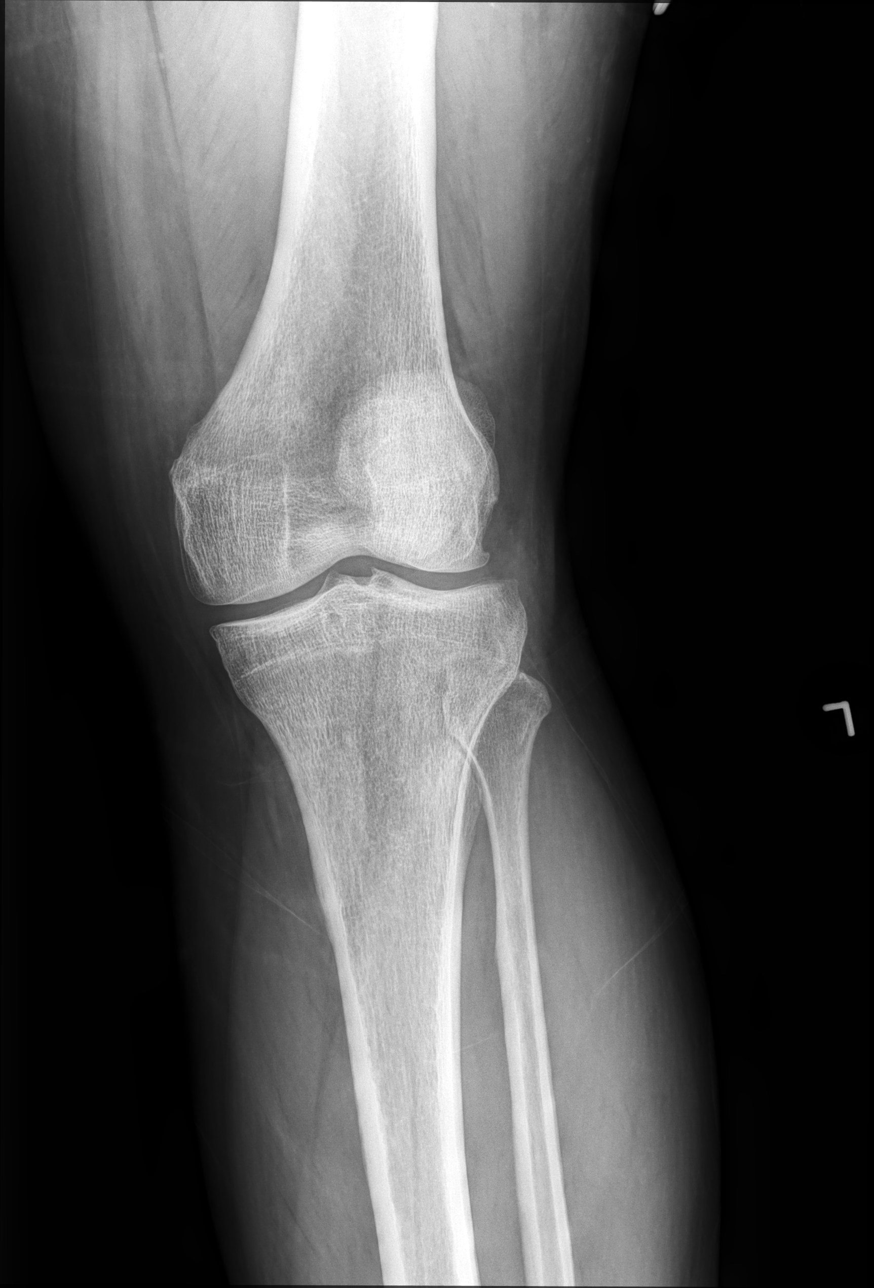

[knee mlo (1 of 2)]
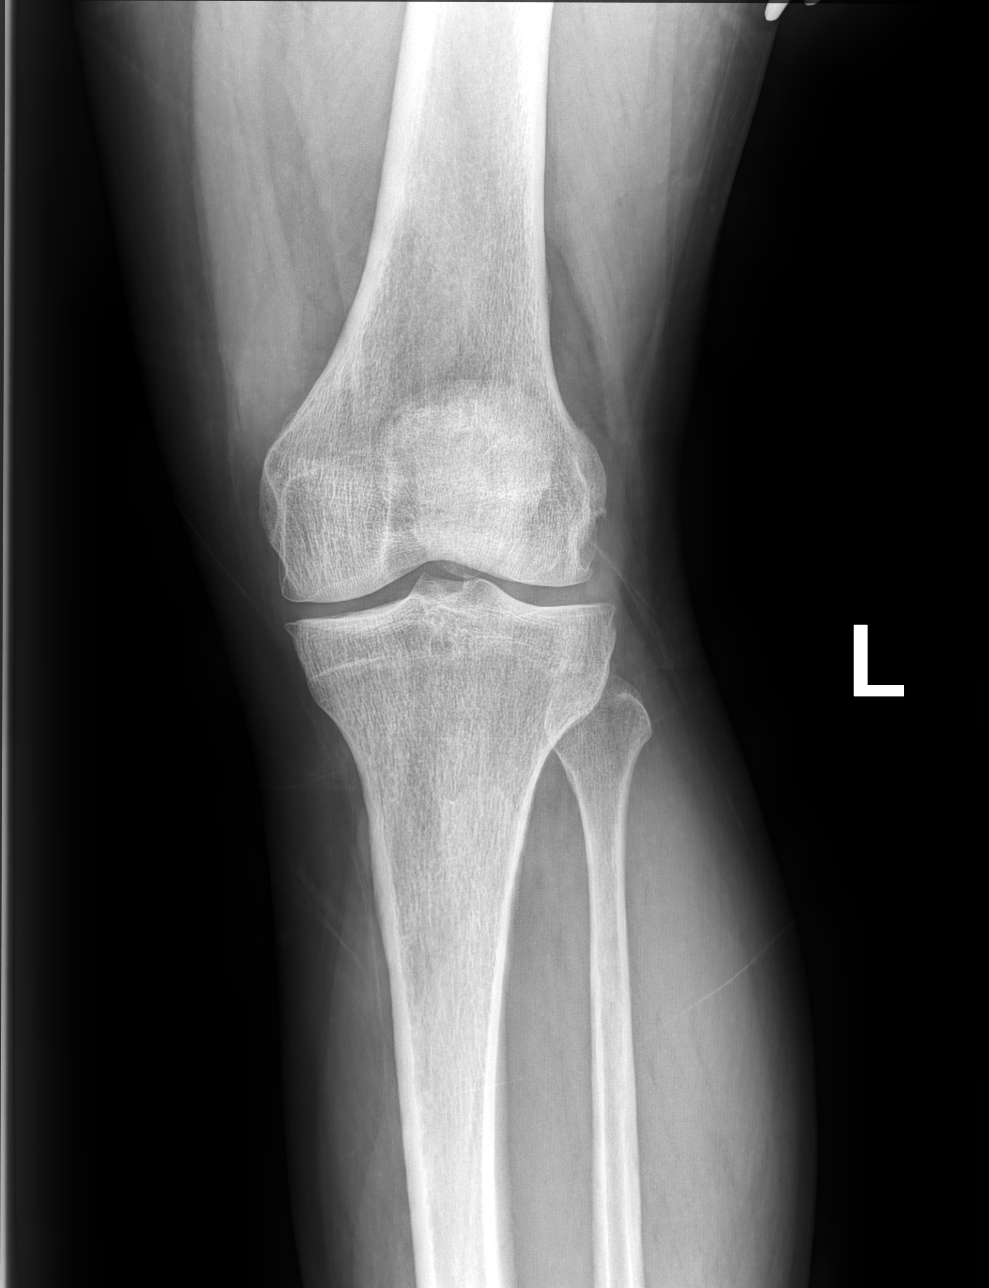

[knee mlo (2 of 2)]
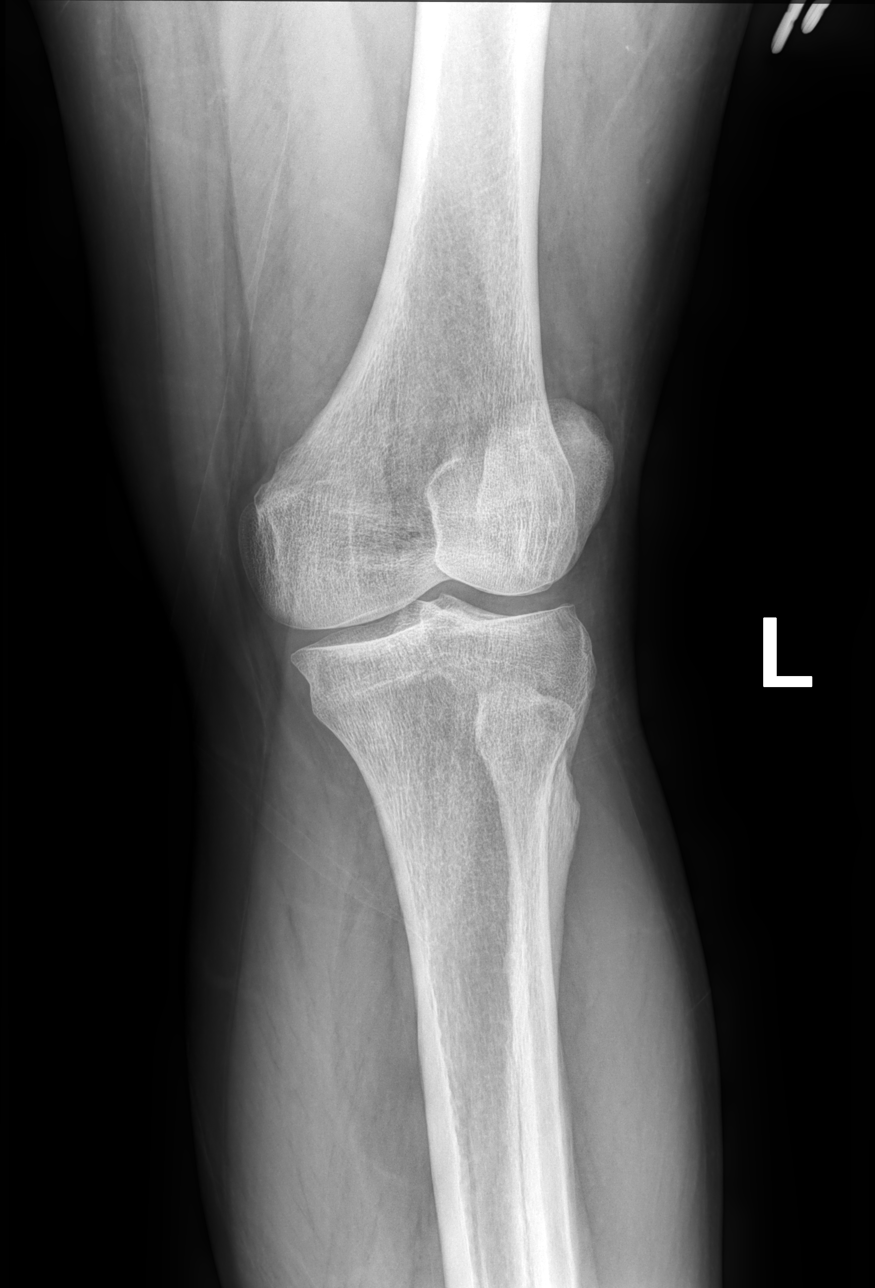

[knee lat]
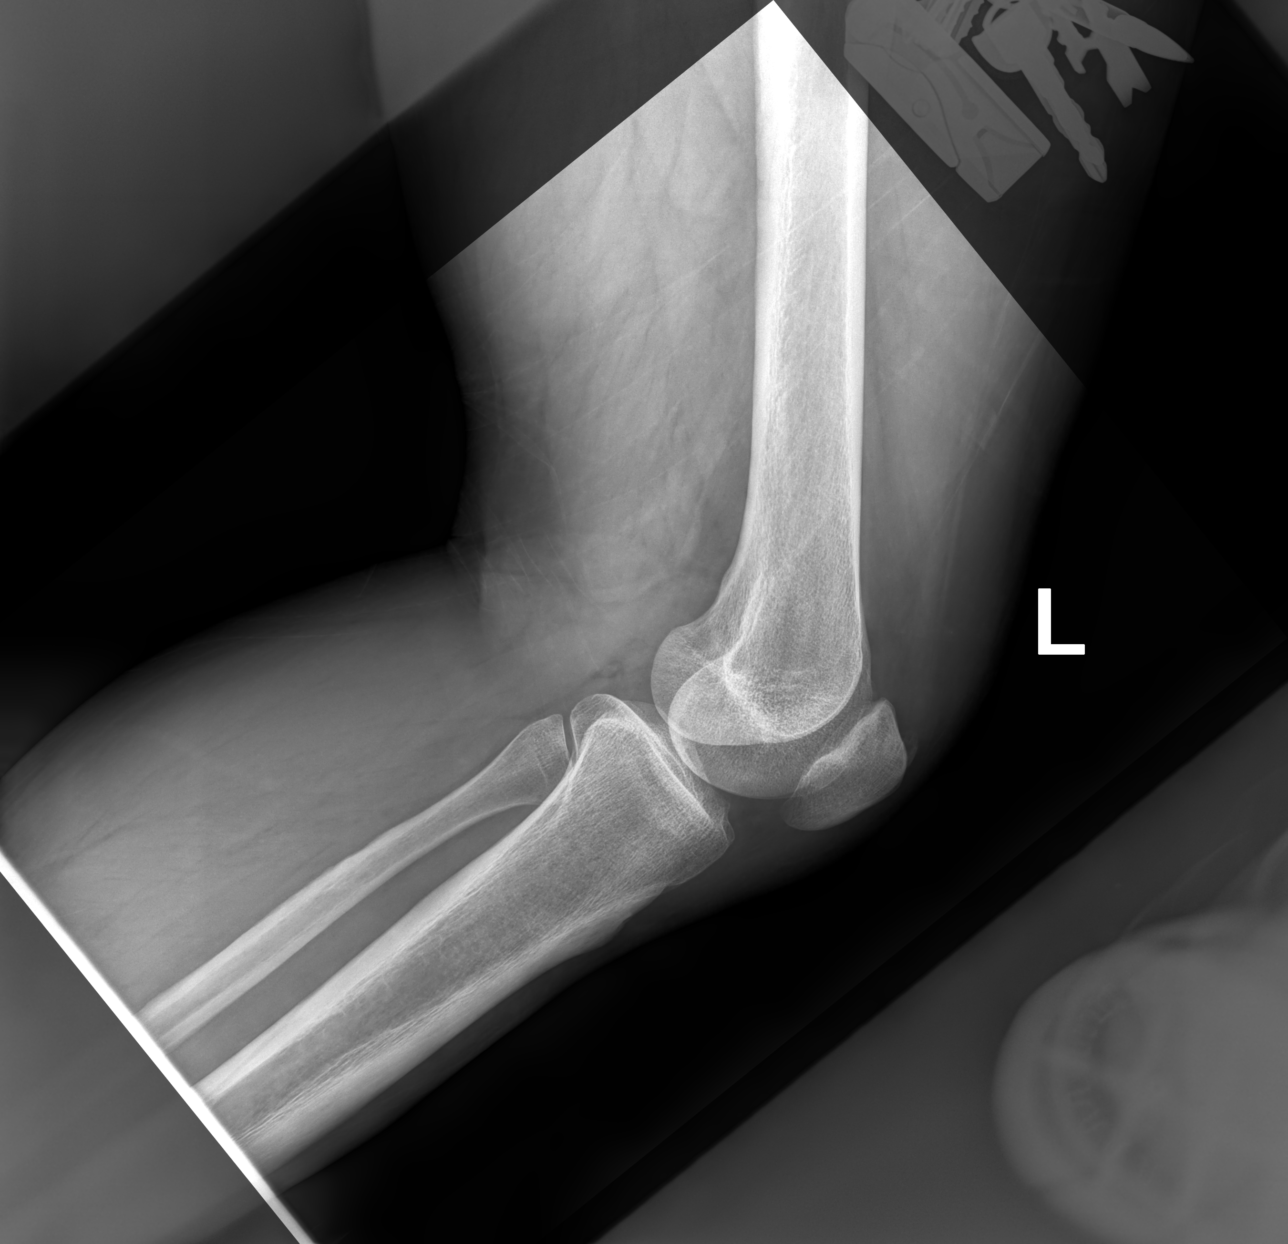

[4 of 4 positions shown; findings below may reference images not displayed]

FINDINGS: No acute fracture dislocation. No joint effusion. Mild degenerative
osteoarthritic changes present about the knee, most notable at the
lateral femorotibial joint space compartment, mildly advanced for
age. Enthesophyte present at the superior pole of the patella. No
focal osseous lesions. No visible soft tissue injury.
IMPRESSION: 1. No acute osseous abnormality about the left knee.
2. Mild degenerative osteoarthritic changes about the knee, most
notable at the lateral femorotibial joint space compartment.
Appearance is mildly advanced for age.

## 2020-11-26 MED ORDER — TRAMADOL HCL 50 MG PO TABS: 50.0000 mg | ORAL_TABLET | Freq: Four times a day (QID) | ORAL | 0 refills | Status: DC | PRN

## 2020-11-26 MED ORDER — MELOXICAM 15 MG PO TABS: 15.0000 mg | ORAL_TABLET | Freq: Every day | ORAL | 0 refills | Status: DC

## 2020-11-26 NOTE — ED Provider Notes (Signed)
[facility]   [id number] 11/26/20 Arrival Time: 1905  EX:NTZGY PAIN  SUBJECTIVE: History from: patient. David Boyd is a 35 y.o. male complains of left knee pain that began last week.  Reports that he slipped on ice and fell.  Reports that he has been taking ibuprofen with no relief.  Denies similar symptoms in the past.  Reports that the pain is intermittent and aching character.  Pain is worse with activity.  Also reports that he has swelling and redness surrounding his left middle finger nail to the medial aspect of the finger.  Denies injury to this as well.  Denies previous symptoms.  Has not attempted to treat this at home either. Denies fever, chills, erythema, ecchymosis, effusion, weakness, numbness and tingling, saddle paresthesias, loss of bowel or bladder function.      ROS: As per HPI.  All other pertinent ROS negative.     History reviewed. No pertinent past medical history. Past Surgical History:  Procedure Laterality Date   COLONOSCOPY N/A 06/01/2014   Procedure: COLONOSCOPY;  Surgeon: Malissa Hippo, MD;  Location[facility];  Service: Endoscopy;  Laterality: N/A;  730   Pilondial cyst removed     PILONIDAL CYST EXCISION     [facility]- Dr Malvin Johns   UMBILICAL HERNIA REPAIR N/A 05/21/2020   Procedure: HERNIA REPAIR UMBILICAL ADULT;  Surgeon: Franky Macho, MD;  Location: [facility] ORS;  Service: General;  Laterality: N/A;   No Known Allergies No current facility-administered medications on file prior to encounter.   Current Outpatient Medications on File Prior to Encounter  Medication Sig Dispense Refill   omeprazole (PRILOSEC) 20 MG capsule Take 1 capsule (20 mg total) by mouth daily. 30 capsule 0   Social History   Socioeconomic History   Marital status: Married    Spouse name: Not on file   Number of children: Not on file   Years of education: Not on file   Highest education level: Not on file  Occupational History   Not on file  Tobacco Use    Smoking status: Never Smoker   Smokeless tobacco: Never Used  Substance and Sexual Activity   Alcohol use: Yes    Comment: occasional   Drug use: No   Sexual activity: Yes    Birth control/protection: None  Other Topics Concern   Not on file  Social History Narrative   Not on file   Social Determinants of Health   Financial Resource Strain: Not on file  Food Insecurity: Not on file  Transportation Needs: Not on file  Physical Activity: Not on file  Stress: Not on file  Social Connections: Not on file  Intimate Partner Violence: Not on file   No family history on file.  OBJECTIVE:  Vitals:   11/26/20 1930  BP: (!) 155/97  Pulse: 88  Resp: 19  Temp: 98.4 F (36.9 C)  TempSrc: Oral  SpO2: 97%    General appearance: ALERT; in no acute distress.  Head: NCAT Lungs: Normal respiratory effort CV: pulses 2+ bilaterally. Cap refill < 2 seconds Musculoskeletal:  Inspection: Skin warm, dry, clear and intact No erythema, effusion to left knee Palpation: Left knee non tender to palpation ROM: Limited ROM active and passive to left knee Skin: warm and dry Neurologic: Ambulates without difficulty; Sensation intact about the upper/ lower extremities Psychological: alert and cooperative; normal mood and affect  DIAGNOSTIC STUDIES:  DG Knee Complete 4 Views Left  Result Date: 11/26/2020 CLINICAL DATA:  Initial evaluation  for persistent knee pain status post injury 2 weeks ago. EXAM: LEFT KNEE - COMPLETE 4+ VIEW COMPARISON:  Prior radiograph from 03/17/2008. FINDINGS: No acute fracture dislocation. No joint effusion. Mild degenerative osteoarthritic changes present about the knee, most notable at the lateral femorotibial joint space compartment, mildly advanced for age. Enthesophyte present at the superior pole of the patella. No focal osseous lesions. No visible soft tissue injury. IMPRESSION: 1. No acute osseous abnormality about the left knee. 2. Mild degenerative  osteoarthritic changes about the knee, most notable at the lateral femorotibial joint space compartment. Appearance is mildly advanced for age. Electronically Signed   By: Rise Mu M.D.   On: 11/26/2020 19:42     ASSESSMENT & PLAN:  1. Primary osteoarthritis of left knee   2. Acute pain of left knee   3. Paronychia of left middle finger      Meds ordered this encounter  Medications   meloxicam (MOBIC) 15 MG tablet    Sig: Take 1 tablet (15 mg total) by mouth daily.    Dispense:  30 tablet    Refill:  0    Order Specific Question:   Supervising Provider    Answer:   Merrilee Jansky [[id number]]   traMADol (ULTRAM) 50 MG tablet    Sig: Take 1 tablet (50 mg total) by mouth every 6 (six) hours as needed.    Dispense:  15 tablet    Refill:  0    Order Specific Question:   Supervising Provider    Answer:   Merrilee Jansky [[id number]]    X-ray shows osteoarthritis to the left knee Prescribed Mobic daily.  Do not take ibuprofen with this medication Tramadol prescribed for breakthrough pain Take as directed Continue conservative management of rest, ice, and gentle stretches Take Tylenol as needed for pain relief   For paronychia: Warm compresses to the area If the area is draining, continue with warm compresses Follow-up in this office with increased swelling, redness, heat, red streaking up the hand or arm, other concerning symptoms Not a candidate for I&D in office today  Follow up with PCP if symptoms persist Return or go to the ER if you have any new or worsening symptoms (fever, chills, chest pain, abdominal pain, changes in bowel or bladder habits, pain radiating into lower legs)   [place] Controlled Substances Registry consulted for this patient. I feel the risk/benefit ratio today is favorable for proceeding with this prescription for a controlled substance. Medication sedation precautions given.  Reviewed expectations re: course of current medical issues.  Questions answered. Outlined signs and symptoms indicating need for more acute intervention. Patient verbalized understanding. After Visit Summary given.       Moshe Cipro, NP 11/27/20 1025

## 2020-11-26 NOTE — ED Triage Notes (Signed)
Pt fell on his LT knee x 2 weeks ago started having pain x 2 days ago.  Also having pain and swelling in LT middle finger since Tuesday

## 2020-11-26 NOTE — Discharge Instructions (Addendum)
Your xray was negative  I have sent in meloxicam for you to take daily. Do not take ibuprofen with this medication. You may take tylenol with this medication  I have also sent in tramadol for you to take one tablet every 6 hours as needed for pain  Follow up with this office or with primary care if symptoms are persisting.  Follow up in the ER for high fever, trouble swallowing, trouble breathing, other concerning symptoms.

## 2020-11-27 ENCOUNTER — Encounter: Payer: Self-pay | Admitting: Orthopaedic Surgery

## 2020-11-27 ENCOUNTER — Other Ambulatory Visit: Payer: Self-pay

## 2020-11-27 ENCOUNTER — Telehealth: Payer: Self-pay

## 2020-11-27 ENCOUNTER — Ambulatory Visit (INDEPENDENT_AMBULATORY_CARE_PROVIDER_SITE_OTHER): Payer: Commercial Managed Care - PPO | Admitting: Orthopaedic Surgery

## 2020-11-27 ENCOUNTER — Telehealth: Payer: Self-pay | Admitting: Radiology

## 2020-11-27 VITALS — BP 154/93 | HR 89 | Ht 72.0 in | Wt 290.0 lb

## 2020-11-27 DIAGNOSIS — M25562 Pain in left knee: Secondary | ICD-10-CM | POA: Diagnosis not present

## 2020-11-27 DIAGNOSIS — W000XXA Fall on same level due to ice and snow, initial encounter: Secondary | ICD-10-CM

## 2020-11-27 MED ORDER — HYDROCODONE-ACETAMINOPHEN 5-325 MG PO TABS: ORAL_TABLET | ORAL | 0 refills | Status: DC

## 2020-11-27 NOTE — Progress Notes (Signed)
Patient David Boyd, male DOB:May 29, 1986, 35 y.o. KGU:[id number]  Chief Complaint  Patient presents with  . Knee Injury    L/ DOI 11/12/20 fell on ice landed on knee/took advil, ibuprofren didn't help.Soaked in a warm tub of water.    HPI  David Boyd is a 35 y.o. male who took as misstep last week during the snow we had then, before the snow we had just two days ago.  He hurt his left knee.  He has had swelling, pain and giving way.  It hurts at night and keeps him up.  He has medial pain.  He is limping.  Nothing helps, he has tried Advil, ice, heat and rest and still hurts.  He went to Urgent Care yesterday and had X-rays.  I have independently reviewed and interpreted x-rays of this patient done at another site by another physician or qualified health professional.  I have reviewed the Urgent Care notes.  I am concerned about a medial meniscus tear of the left knee.     Body mass index is 39.33 kg/m.  ROS  Review of Systems  Constitutional: Positive for activity change.  Musculoskeletal: Positive for arthralgias, gait problem and joint swelling.  All other systems reviewed and are negative.   All other systems reviewed and are negative.  The following is a summary of the past history medically, past history surgically, known current medicines, social history and family history.  This information is gathered electronically by the computer from prior information and documentation.  I review this each visit and have found including this information at this point in the chart is beneficial and informative.    History reviewed. No pertinent past medical history.  Past Surgical History:  Procedure Laterality Date  . COLONOSCOPY N/A 06/01/2014   Procedure: COLONOSCOPY;  Surgeon: Malissa Hippo, MD;  Location: [facility];  Service: Endoscopy;  Laterality: N/A;  730  . Pilondial cyst removed    . PILONIDAL CYST EXCISION     [facility]- Dr Malvin Johns  . UMBILICAL HERNIA REPAIR  N/A 05/21/2020   Procedure: HERNIA REPAIR UMBILICAL ADULT;  Surgeon: Franky Macho, MD;  Location: [facility];  Service: General;  Laterality: N/A;    History reviewed. No pertinent family history.  Social History Social History   Tobacco Use  . Smoking status: Never Smoker  . Smokeless tobacco: Never Used  Substance Use Topics  . Alcohol use: Yes    Comment: occasional  . Drug use: No    No Known Allergies  Current Outpatient Medications  Medication Sig Dispense Refill  . HYDROcodone-acetaminophen (NORCO/VICODIN) 5-325 MG tablet One tablet every four hours for pain. 30 tablet 0  . meloxicam (MOBIC) 15 MG tablet Take 1 tablet (15 mg total) by mouth daily. 30 tablet 0  . omeprazole (PRILOSEC) 20 MG capsule Take 1 capsule (20 mg total) by mouth daily. 30 capsule 0  . traMADol (ULTRAM) 50 MG tablet Take 1 tablet (50 mg total) by mouth every 6 (six) hours as needed. 15 tablet 0   No current facility-administered medications for this visit.     Physical Exam  Blood pressure (!) 154/93, pulse 89, height 6' (1.829 m), weight 290 lb (131.5 kg).  Constitutional: overall normal hygiene, normal nutrition, well developed, normal grooming, normal body habitus. Assistive device:none  Musculoskeletal: gait and station Limp left, muscle tone and strength are normal, no tremors or atrophy is present.  .  Neurological: coordination overall normal.  Deep tendon reflex/nerve stretch intact.  Sensation normal.  Cranial nerves II-XII intact.   Skin:   Normal overall no scars, lesions, ulcers or rashes. No psoriasis.  Psychiatric: Alert and oriented x 3.  Recent memory intact, remote memory unclear.  Normal mood and affect. Well groomed.  Good eye contact.  Cardiovascular: overall no swelling, no varicosities, no edema bilaterally, normal temperatures of the legs and arms, no clubbing, cyanosis and good capillary refill.  Lymphatic: palpation is normal.  Left knee is tender medially, has  effusion, crepitus and ROM 0 to 105 with pain, positive medial [place].  NV intact.  Limp left.  All other systems reviewed and are negative   The patient has been educated about the nature of the problem(s) and counseled on treatment options.  The patient appeared to understand what I have discussed and is in agreement with it.  Encounter Diagnosis  Name Primary?  . Acute pain of left knee Yes    PLAN Call if any problems.  Precautions discussed.  Continue current medications.   Return to clinic 2 weeks   Get MRI.  PROCEDURE NOTE:  The patient requests injections of the left knee , verbal consent was obtained.  The left knee was prepped appropriately after time out was performed.   Sterile technique was observed and injection of 1 cc of Depo-Medrol 40 mg with several cc's of plain xylocaine. Anesthesia was provided by ethyl chloride and a 20-gauge needle was used to inject the knee area. The injection was tolerated well.  A band aid dressing was applied.  The patient was advised to apply ice later today and tomorrow to the injection sight as needed. I have reviewed the [place] Controlled Substance Reporting System web site prior to prescribing narcotic medicine for this patient.     Call if any problem.  Precautions discussed.   Electronically Signed Darreld Mclean, MD 1/18/20222:31 PM

## 2020-11-27 NOTE — Telephone Encounter (Signed)
Wife called, asking if pt needs to get the meloxicam prescribed by the UC yesterday.  They had not picked it up from the pharmacy yet, and she wants to make sure Dr Hilda Lias wants him taking it also, please call to advise.

## 2020-11-27 NOTE — Telephone Encounter (Signed)
Spoke with Dr. Hilda Lias and he said it was ok for the patient to take the Meloxicam. I spoke with patient's wife and relayed this to her, she stated she would go and pick it up then.

## 2020-11-27 NOTE — Telephone Encounter (Signed)
error 

## 2020-12-11 ENCOUNTER — Ambulatory Visit (HOSPITAL_COMMUNITY)
Admission: RE | Admit: 2020-12-11 | Discharge: 2020-12-11 | Disposition: A | Discharge status: Home / Self Care | Payer: Commercial Managed Care - PPO | Source: Ambulatory Visit | Attending: Orthopaedic Surgery | Admitting: Orthopaedic Surgery

## 2020-12-11 ENCOUNTER — Other Ambulatory Visit: Payer: Self-pay

## 2020-12-11 DIAGNOSIS — M25562 Pain in left knee: Secondary | ICD-10-CM | POA: Diagnosis present

## 2020-12-11 IMAGING — MR MR KNEE*L* W/O CM
7 series · 40 of 40 positions shown · non-contrast
Comparison: Left knee x-rays dated [DATE].

CLINICAL DATA: Left knee pain for the past 2 weeks. No injury or
prior surgery.

EXAM:
MRI OF THE LEFT KNEE WITHOUT CONTRAST
TECHNIQUE: Multiplanar, multisequence MR imaging of the knee was performed. No
intravenous contrast was administered.

[Series 8: T2 fat-sat · axial · left · 4.0mm · 0.47mm/px · z∈[-67,+57]mm · 6 of 26 slices shown (1 of 3)]
[im 1/26]
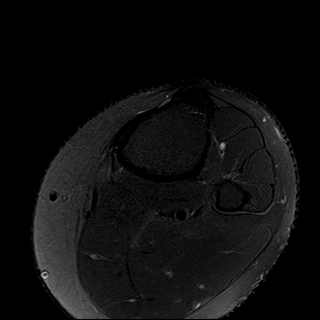
[im 6/26]
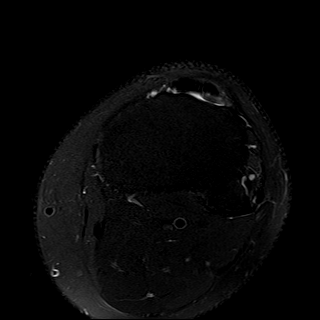
[im 11/26]
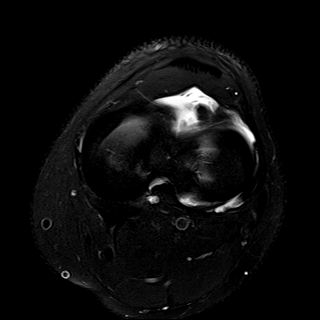
[im 16/26]
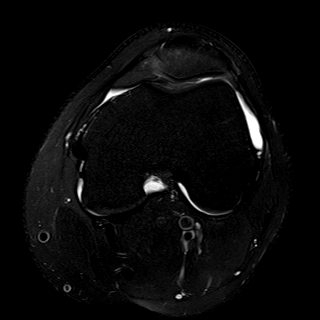
[im 21/26]
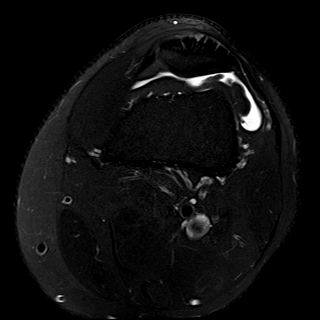
[im 26/26]
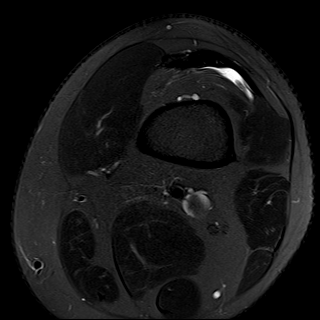

[Series 9: T1 · coronal · left · 4.0mm · 0.59mm/px · 6 of 24 slices shown]
[im 1/24]
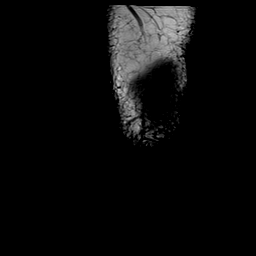
[im 5/24]
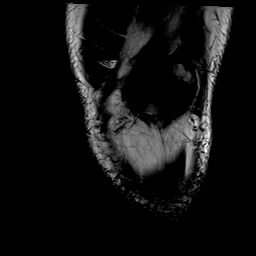
[im 10/24]
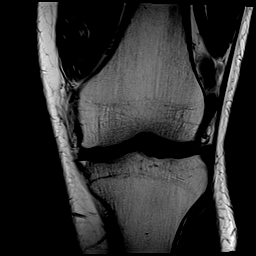
[im 14/24]
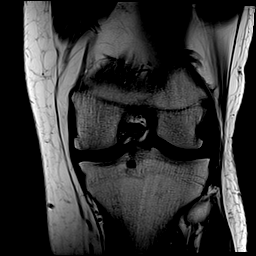
[im 19/24]
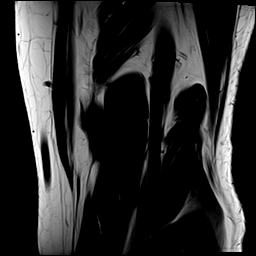
[im 24/24]
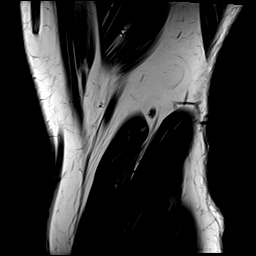

[Series 10: T2 fat-sat · coronal · left · 4.0mm · 0.59mm/px · 6 of 27 slices shown (2 of 3)]
[im 1/27]
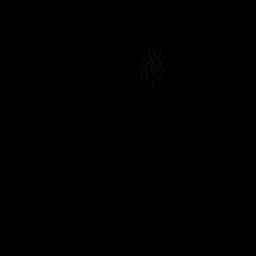
[im 6/27]
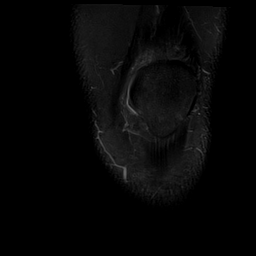
[im 11/27]
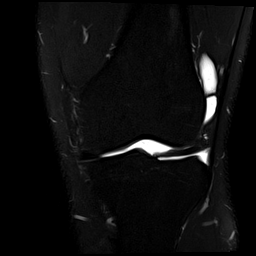
[im 16/27]
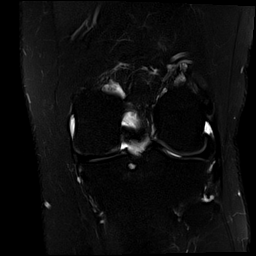
[im 21/27]
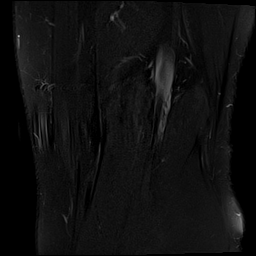
[im 27/27]
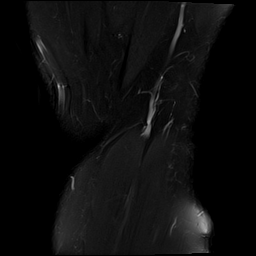

[Series 11: PD fat-sat · coronal · left · 4.0mm · 0.59mm/px · 7 of 28 slices shown (1 of 2)]
[im 1/28]
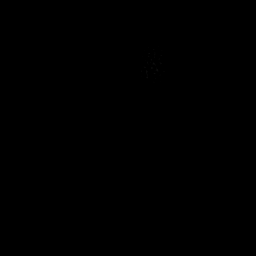
[im 5/28]
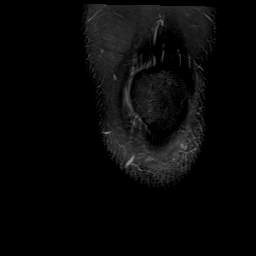
[im 10/28]
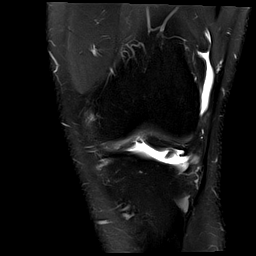
[im 14/28]
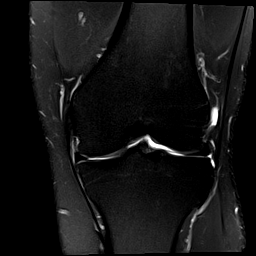
[im 19/28]
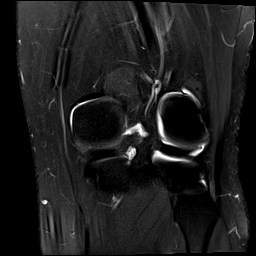
[im 23/28]
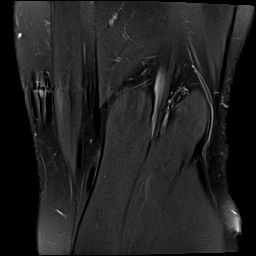
[im 28/28]
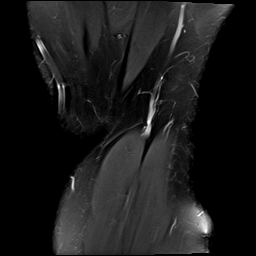

[Series 12: PD fat-sat · sagittal · left · 3.0mm · 0.59mm/px · 7 of 28 slices shown (2 of 2)]
[im 1/28]
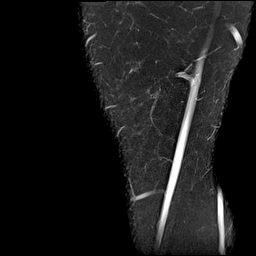
[im 5/28]
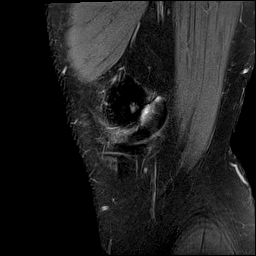
[im 10/28]
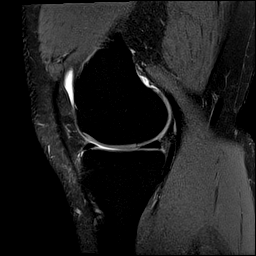
[im 14/28]
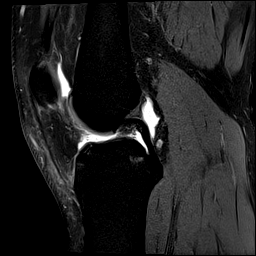
[im 19/28]
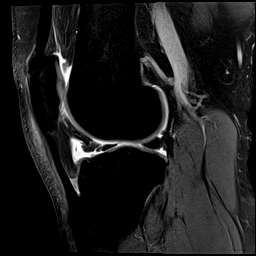
[im 23/28]
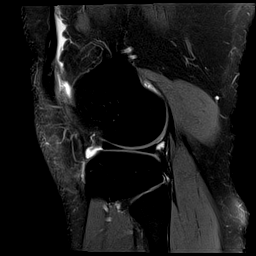
[im 28/28]
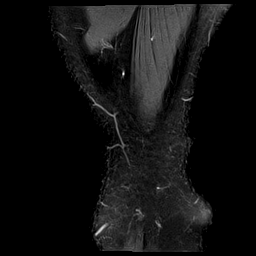

[Series 13: T2 fat-sat · sagittal · left · 3.0mm · 0.59mm/px · 6 of 27 slices shown (3 of 3)]
[im 1/27]
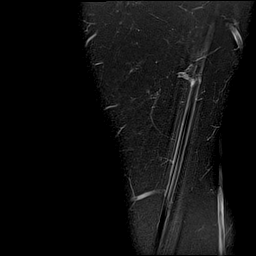
[im 6/27]
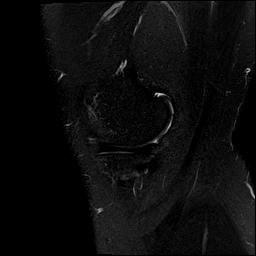
[im 11/27]
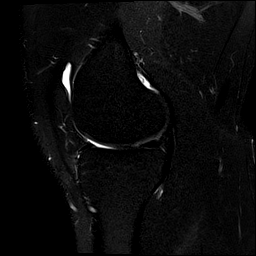
[im 16/27]
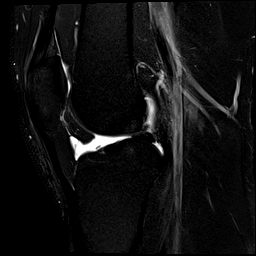
[im 21/27]
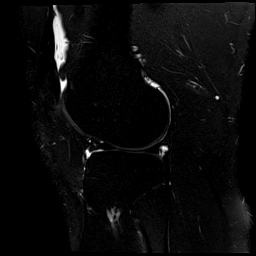
[im 27/27]
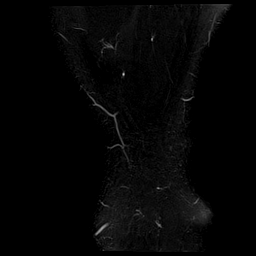

[Series 14: PD · coronal · left · 2.0mm · 0.47mm/px · 2 of 10 slices shown]
[im 1/10]
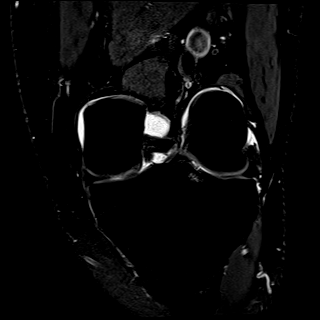
[im 10/10]
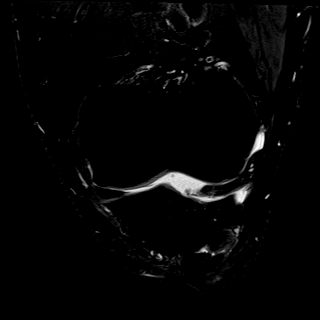

[40 of 40 positions shown; findings below may reference images not displayed]

FINDINGS: MENISCI

Medial meniscus:  Intact.

Lateral meniscus:  Intact.

LIGAMENTS

Cruciates:  Intact ACL and PCL.

Collaterals: Medial collateral ligament is intact. Lateral
collateral ligament complex is intact.

CARTILAGE

Patellofemoral:  No chondral defect.

Medial: Focal near full-thickness cartilage defect over the central
weight-bearing medial femoral condyle.

Lateral:  No chondral defect.

Joint:  Small joint effusion.  Normal Hoffa's fat.

Popliteal Fossa:  No Baker cyst. Intact popliteus tendon.

Extensor Mechanism: Intact quadriceps tendon with focal enthesitis
at the insertion (series 13, image 18). Intact patellar tendon.
Intact medial and lateral patellar retinaculum. Intact MPFL.

Bones: Faint reactive marrow edema in the superior pole of the
patella at the quadriceps insertion. No acute fracture or
dislocation. No suspicious bone lesion.

Other: None.
IMPRESSION: 1. Focal enthesitis at the quadriceps tendon insertion.
2. Focal near full-thickness cartilage defect over the central
weight-bearing medial femoral condyle.
3. Small joint effusion.

## 2020-12-18 ENCOUNTER — Ambulatory Visit: Payer: Commercial Managed Care - PPO | Admitting: Orthopaedic Surgery

## 2020-12-20 ENCOUNTER — Encounter: Payer: Self-pay | Admitting: Radiology

## 2020-12-20 ENCOUNTER — Other Ambulatory Visit: Payer: Self-pay

## 2020-12-20 ENCOUNTER — Ambulatory Visit (INDEPENDENT_AMBULATORY_CARE_PROVIDER_SITE_OTHER): Payer: Commercial Managed Care - PPO | Admitting: Orthopaedic Surgery

## 2020-12-20 ENCOUNTER — Encounter: Payer: Self-pay | Admitting: Orthopaedic Surgery

## 2020-12-20 VITALS — BP 150/87 | HR 69 | Ht 72.0 in | Wt 288.0 lb

## 2020-12-20 DIAGNOSIS — M25562 Pain in left knee: Secondary | ICD-10-CM | POA: Diagnosis not present

## 2020-12-20 DIAGNOSIS — G8929 Other chronic pain: Secondary | ICD-10-CM | POA: Diagnosis not present

## 2020-12-20 NOTE — Progress Notes (Signed)
Patient MW:NUUVOZ Darvin Neighbours, male DOB:Dec 08, 1985, 35 y.o. DGU:[id number]  No chief complaint on file.   HPI  JONMICHAEL BEADNELL is a 35 y.o. male who has left knee pain. He had MRI done which showed: IMPRESSION: 1. Focal enthesitis at the quadriceps tendon insertion. 2. Focal near full-thickness cartilage defect over the central weight-bearing medial femoral condyle. 3. Small joint effusion.  I have explained the significance of the problem.  I recommend viscosupplementation. He will come back in two weeks to start this.   Body mass index is 39.06 kg/m.  ROS  Review of Systems  Constitutional: Positive for activity change.  Musculoskeletal: Positive for arthralgias, gait problem and joint swelling.  All other systems reviewed and are negative.   All other systems reviewed and are negative.  The following is a summary of the past history medically, past history surgically, known current medicines, social history and family history.  This information is gathered electronically by the computer from prior information and documentation.  I review this each visit and have found including this information at this point in the chart is beneficial and informative.    History reviewed. No pertinent past medical history.  Past Surgical History:  Procedure Laterality Date  . COLONOSCOPY N/A 06/01/2014   Procedure: COLONOSCOPY;  Surgeon: Malissa Hippo, MD;  Location: [facility];  Service: Endoscopy;  Laterality: N/A;  730  . Pilondial cyst removed    . PILONIDAL CYST EXCISION     [facility]- Dr Malvin Johns  . UMBILICAL HERNIA REPAIR N/A 05/21/2020   Procedure: HERNIA REPAIR UMBILICAL ADULT;  Surgeon: Franky Macho, MD;  Location: [facility];  Service: General;  Laterality: N/A;    History reviewed. No pertinent family history.  Social History Social History   Tobacco Use  . Smoking status: Never Smoker  . Smokeless tobacco: Never Used  Substance Use Topics  . Alcohol use: Yes    Comment:  occasional  . Drug use: No    No Known Allergies  Current Outpatient Medications  Medication Sig Dispense Refill  . HYDROcodone-acetaminophen (NORCO/VICODIN) 5-325 MG tablet One tablet every four hours for pain. 30 tablet 0  . meloxicam (MOBIC) 15 MG tablet Take 1 tablet (15 mg total) by mouth daily. 30 tablet 0  . omeprazole (PRILOSEC) 20 MG capsule Take 1 capsule (20 mg total) by mouth daily. 30 capsule 0  . traMADol (ULTRAM) 50 MG tablet Take 1 tablet (50 mg total) by mouth every 6 (six) hours as needed. 15 tablet 0   No current facility-administered medications for this visit.     Physical Exam  Blood pressure (!) 150/87, pulse 69, height 6' (1.829 m), weight 288 lb (130.6 kg).  Constitutional: overall normal hygiene, normal nutrition, well developed, normal grooming, normal body habitus. Assistive device:none  Musculoskeletal: gait and station Limp left, muscle tone and strength are normal, no tremors or atrophy is present.  .  Neurological: coordination overall normal.  Deep tendon reflex/nerve stretch intact.  Sensation normal.  Cranial nerves II-XII intact.   Skin:   Normal overall no scars, lesions, ulcers or rashes. No psoriasis.  Psychiatric: Alert and oriented x 3.  Recent memory intact, remote memory unclear.  Normal mood and affect. Well groomed.  Good eye contact.  Cardiovascular: overall no swelling, no varicosities, no edema bilaterally, normal temperatures of the legs and arms, no clubbing, cyanosis and good capillary refill.  Lymphatic: palpation is normal.  Left knee is tender but has near full ROM today.  He has  slight medial joint line pain, slight effusion.  NV intact.  All other systems reviewed and are negative   The patient has been educated about the nature of the problem(s) and counseled on treatment options.  The patient appeared to understand what I have discussed and is in agreement with it.  Encounter Diagnosis  Name Primary?  . Chronic pain  of left knee Yes    PLAN Call if any problems.  Precautions discussed.  Continue current medications.   Return to clinic 2 weeks. Begin Viscosupplementation then.   Electronically Signed Darreld Mclean, MD 2/10/20229:05 AM

## 2020-12-20 NOTE — Progress Notes (Signed)
Submitted online Euflexxa, pending BV.  Left knee OA.

## 2020-12-20 NOTE — Progress Notes (Signed)
Message Received: Today Neldon Labella, CMA  Safiyah Cisney, RT This patient wants to see if he can be approved for Orthovisc or Synvisc per Dr, Hilda Lias. Patient is ok with either one. Whichever is cheaper per patient.   thanks

## 2020-12-20 NOTE — Progress Notes (Signed)
One box is 3 injections, so this one box is good for him.   Will you write his name on the box please?  Thanks!

## 2020-12-20 NOTE — Progress Notes (Signed)
There is 1 box in the closet. He only needs 3 correct?

## 2020-12-20 NOTE — Progress Notes (Signed)
Per insurance we verified Euflexxa.  Patient is scheduled for his first injection already, and he will need three total.  Can you please look and see if we have any Euflexxa in stock here already?  If not I will need to order. Thanks!  No cost to patient, insurance should cover at 100%.

## 2021-01-03 ENCOUNTER — Ambulatory Visit: Payer: Commercial Managed Care - PPO | Admitting: Orthopaedic Surgery

## 2021-01-08 ENCOUNTER — Encounter: Payer: Self-pay | Admitting: Orthopaedic Surgery

## 2021-01-08 ENCOUNTER — Telehealth: Payer: Self-pay | Admitting: Orthopaedic Surgery

## 2021-01-08 ENCOUNTER — Other Ambulatory Visit: Payer: Self-pay

## 2021-01-08 ENCOUNTER — Ambulatory Visit (INDEPENDENT_AMBULATORY_CARE_PROVIDER_SITE_OTHER): Payer: Commercial Managed Care - PPO | Admitting: Orthopaedic Surgery

## 2021-01-08 VITALS — Ht 72.0 in | Wt 288.0 lb

## 2021-01-08 DIAGNOSIS — M1712 Unilateral primary osteoarthritis, left knee: Secondary | ICD-10-CM | POA: Diagnosis not present

## 2021-01-08 DIAGNOSIS — G8929 Other chronic pain: Secondary | ICD-10-CM

## 2021-01-08 MED ORDER — HYDROCODONE-ACETAMINOPHEN 5-325 MG PO TABS: 1.0000 | ORAL_TABLET | ORAL | 0 refills | Status: DC | PRN

## 2021-01-08 MED ORDER — HYDROCODONE-ACETAMINOPHEN 7.5-325 MG PO TABS: 1.0000 | ORAL_TABLET | ORAL | 0 refills | Status: AC | PRN

## 2021-01-08 NOTE — Telephone Encounter (Signed)
Patient states he was here earlier today and was given prescription for  Hydrocodone/Aetaminophen 5-325 mgs.  He said he told the nurse and the doctor that he wanted something stronger.  I told him that I would send the message to Dr. Hilda Lias.     He states he uses [facility]

## 2021-01-08 NOTE — Progress Notes (Signed)
This patient is diagnosed with osteoarthritis of the knee(s).    Radiographs show evidence of joint space narrowing, osteophytes, subchondral sclerosis and/or subchondral cysts.  This patient has knee pain which interferes with functional and activities of daily living.    This patient has experienced inadequate response, adverse effects and/or intolerance with conservative treatments such as acetaminophen, NSAIDS, topical creams, physical therapy or regular exercise, knee bracing and/or weight loss.   This patient has experienced inadequate response or has a contraindication to intra articular steroid injections for at least 3 months.   This patient is not scheduled to have a total knee replacement within 6 months of starting treatment with viscosupplementation.  PROCEDURE NOTE:  Euflexxa injection #  1 of 3   Injection 1 vial of Euflexxa into left  knee  Ht 6' (1.829 m)   Wt 288 lb (130.6 kg)   BMI 39.06 kg/m   The left knee exam: there was no synovitis or infection   The knee was prepped sterilely  Ethyl chloride was used to anesthetize the skin A 20 g needle was used to inject the knee with 1 vial of Euflexxa A sterile dressing was placed  There were no complications  Encounter Diagnosis  Name Primary?  . Chronic pain of left knee Yes    Follow up one week  I have reviewed the [place] Controlled Substance Reporting System web site prior to prescribing narcotic medicine for this patient.   Electronically Signed Darreld Mclean, MD 3/1/202210:19 AM

## 2021-01-15 ENCOUNTER — Other Ambulatory Visit: Payer: Self-pay

## 2021-01-15 ENCOUNTER — Ambulatory Visit (INDEPENDENT_AMBULATORY_CARE_PROVIDER_SITE_OTHER): Payer: Commercial Managed Care - PPO | Admitting: Orthopaedic Surgery

## 2021-01-15 ENCOUNTER — Encounter: Payer: Self-pay | Admitting: Orthopaedic Surgery

## 2021-01-15 DIAGNOSIS — M1712 Unilateral primary osteoarthritis, left knee: Secondary | ICD-10-CM | POA: Diagnosis not present

## 2021-01-15 DIAGNOSIS — M25562 Pain in left knee: Secondary | ICD-10-CM

## 2021-01-15 DIAGNOSIS — G8929 Other chronic pain: Secondary | ICD-10-CM

## 2021-01-15 NOTE — Progress Notes (Signed)
This patient is diagnosed with osteoarthritis of the knee(s).    Radiographs show evidence of joint space narrowing, osteophytes, subchondral sclerosis and/or subchondral cysts.  This patient has knee pain which interferes with functional and activities of daily living.    This patient has experienced inadequate response, adverse effects and/or intolerance with conservative treatments such as acetaminophen, NSAIDS, topical creams, physical therapy or regular exercise, knee bracing and/or weight loss.   This patient has experienced inadequate response or has a contraindication to intra articular steroid injections for at least 3 months.   This patient is not scheduled to have a total knee replacement within 6 months of starting treatment with viscosupplementation.  PROCEDURE NOTE:  Euflexxa injection #  2 of 3   Injection 1 vial of Euflexxa into left  knee  There were no vitals taken for this visit.  The left knee exam: there was no synovitis or infection   The knee was prepped sterilely  Ethyl chloride was used to anesthetize the skin A 20 g needle was used to inject the knee with 1 vial ofEuflexxa A sterile dressing was placed  There were no complications  Encounter Diagnosis  Name Primary?  . Chronic pain of left knee Yes    Follow up one week  Electronically Signed Darreld Mclean, MD 3/8/20228:40 AM

## 2021-01-22 ENCOUNTER — Encounter: Payer: Self-pay | Admitting: Orthopaedic Surgery

## 2021-01-22 ENCOUNTER — Other Ambulatory Visit: Payer: Self-pay

## 2021-01-22 ENCOUNTER — Ambulatory Visit (INDEPENDENT_AMBULATORY_CARE_PROVIDER_SITE_OTHER): Payer: Commercial Managed Care - PPO | Admitting: Orthopaedic Surgery

## 2021-01-22 VITALS — Ht 72.0 in | Wt 288.0 lb

## 2021-01-22 DIAGNOSIS — M1712 Unilateral primary osteoarthritis, left knee: Secondary | ICD-10-CM

## 2021-01-22 DIAGNOSIS — G8929 Other chronic pain: Secondary | ICD-10-CM

## 2021-01-22 NOTE — Progress Notes (Signed)
This patient is diagnosed with osteoarthritis of the knee(s).    Radiographs show evidence of joint space narrowing, osteophytes, subchondral sclerosis and/or subchondral cysts.  This patient has knee pain which interferes with functional and activities of daily living.    This patient has experienced inadequate response, adverse effects and/or intolerance with conservative treatments such as acetaminophen, NSAIDS, topical creams, physical therapy or regular exercise, knee bracing and/or weight loss.   This patient has experienced inadequate response or has a contraindication to intra articular steroid injections for at least 3 months.   This patient is not scheduled to have a total knee replacement within 6 months of starting treatment with viscosupplementation.  PROCEDURE NOTE:  Euflexxa injection #  3 of 3   Injection 1 vial of Euflexxac into left  knee  Ht 6' (1.829 m)   Wt 288 lb (130.6 kg)   BMI 39.06 kg/m   The left knee exam: there was no synovitis or infection   The knee was prepped sterilely  Ethyl chloride was used to anesthetize the skin A 20 g needle was used to inject the knee with 1 vial of Euflexxa A sterile dressing was placed  There were no complications  Encounter Diagnosis  Name Primary?  . Chronic pain of left knee Yes    Follow up one month  Electronically Signed Darreld Mclean, MD 3/15/20228:37 AM

## 2021-02-18 ENCOUNTER — Ambulatory Visit: Payer: Commercial Managed Care - PPO | Admitting: Podiatry

## 2021-02-19 ENCOUNTER — Ambulatory Visit: Payer: Commercial Managed Care - PPO | Admitting: Orthopaedic Surgery

## 2021-04-16 ENCOUNTER — Other Ambulatory Visit: Payer: Self-pay

## 2021-04-16 ENCOUNTER — Encounter: Payer: Self-pay | Admitting: Orthopaedic Surgery

## 2021-04-16 ENCOUNTER — Ambulatory Visit (INDEPENDENT_AMBULATORY_CARE_PROVIDER_SITE_OTHER): Payer: Commercial Managed Care - PPO | Admitting: Orthopaedic Surgery

## 2021-04-16 VITALS — BP 160/100 | HR 73 | Ht 72.0 in | Wt 285.4 lb

## 2021-04-16 DIAGNOSIS — M25562 Pain in left knee: Secondary | ICD-10-CM

## 2021-04-16 DIAGNOSIS — G8929 Other chronic pain: Secondary | ICD-10-CM

## 2021-04-16 DIAGNOSIS — M25561 Pain in right knee: Secondary | ICD-10-CM

## 2021-04-16 MED ORDER — TRAMADOL HCL 50 MG PO TABS: 50.0000 mg | ORAL_TABLET | Freq: Four times a day (QID) | ORAL | 3 refills | Status: AC | PRN

## 2021-04-16 NOTE — Progress Notes (Signed)
Patient EU:MPNTIR David Boyd, male DOB:Jan 29, 1986, 35 y.o. WER:[id number]  Chief Complaint  Patient presents with  . Knee Pain    Both hurt, but my left knee is the worst. The injections did help but standing and bending down on them seem to bother it.    HPI  David Boyd is a 35 y.o. male who has bilateral knee pain, more on the left.  The viscosupplementation only helped the left knee a little.  He still gets pain when he is very active.  He has no giving way, no trauma, no redness.  He cannot squat down.  He is concerned the knee is not improving.  He could not tolerate the Mobic.  I will have him begin Aleve one bid pc to increase to two bid pc if tolerated.   Body mass index is 38.7 kg/m.  ROS  Review of Systems  Constitutional: Positive for activity change.  Musculoskeletal: Positive for arthralgias, gait problem and joint swelling.  All other systems reviewed and are negative.   All other systems reviewed and are negative.  The following is a summary of the past history medically, past history surgically, known current medicines, social history and family history.  This information is gathered electronically by the computer from prior information and documentation.  I review this each visit and have found including this information at this point in the chart is beneficial and informative.    History reviewed. No pertinent past medical history.  Past Surgical History:  Procedure Laterality Date  . COLONOSCOPY N/A 06/01/2014   Procedure: COLONOSCOPY;  Surgeon: Malissa Hippo, MD;  Location: [facility];  Service: Endoscopy;  Laterality: N/A;  730  . Pilondial cyst removed    . PILONIDAL CYST EXCISION     [facility]- Dr Malvin Johns  . UMBILICAL HERNIA REPAIR N/A 05/21/2020   Procedure: HERNIA REPAIR UMBILICAL ADULT;  Surgeon: Franky Macho, MD;  Location: [facility];  Service: General;  Laterality: N/A;    History reviewed. No pertinent family history.  Social History Social  History   Tobacco Use  . Smoking status: Never Smoker  . Smokeless tobacco: Never Used  Substance Use Topics  . Alcohol use: Yes    Comment: occasional  . Drug use: No    No Known Allergies  Current Outpatient Medications  Medication Sig Dispense Refill  . traMADol (ULTRAM) 50 MG tablet Take 1 tablet (50 mg total) by mouth every 6 (six) hours as needed for up to 5 days. One tablet every six hours as needed for pain. 20 tablet 3  . meloxicam (MOBIC) 15 MG tablet Take 1 tablet (15 mg total) by mouth daily. (Patient not taking: Reported on 04/16/2021) 30 tablet 0  . omeprazole (PRILOSEC) 20 MG capsule Take 1 capsule (20 mg total) by mouth daily. 30 capsule 0   No current facility-administered medications for this visit.     Physical Exam  Blood pressure (!) 160/100, pulse 73, height 6' (1.829 m), weight 285 lb 6 oz (129.4 kg).  Constitutional: overall normal hygiene, normal nutrition, well developed, normal grooming, normal body habitus. Assistive device:none  Musculoskeletal: gait and station Limp left, muscle tone and strength are normal, no tremors or atrophy is present.  .  Neurological: coordination overall normal.  Deep tendon reflex/nerve stretch intact.  Sensation normal.  Cranial nerves II-XII intact.   Skin:   Normal overall no scars, lesions, ulcers or rashes. No psoriasis.  Psychiatric: Alert and oriented x 3.  Recent memory intact, remote  memory unclear.  Normal mood and affect. Well groomed.  Good eye contact.  Cardiovascular: overall no swelling, no varicosities, no edema bilaterally, normal temperatures of the legs and arms, no clubbing, cyanosis and good capillary refill.  Lymphatic: palpation is normal.  Left knee is tender, has effusion, crepitus, stable, ROM 0 to 110, slight limp left.  All other systems reviewed and are negative   The patient has been educated about the nature of the problem(s) and counseled on treatment options.  The patient appeared to  understand what I have discussed and is in agreement with it.  Encounter Diagnoses  Name Primary?  . Chronic pain of left knee Yes  . Chronic pain of right knee     PLAN Call if any problems.  Precautions discussed.  Continue current medications. Begin Aleve.   Return to clinic 6 weeks   Electronically Signed Darreld Mclean, MD 6/7/20228:21 AM

## 2021-05-03 ENCOUNTER — Telehealth: Payer: Self-pay | Admitting: Orthopaedic Surgery

## 2021-05-03 NOTE — Telephone Encounter (Signed)
Patient called wanting a refill on the Hydrocodone.  Patient states the Tramadol does not work for him and he keeps telling Dr. Hilda Lias this and he keeps calling in the Tramadol.   I advised the patient that our policy is prescriptions have to be called in by 2:00 on Thursday and Dr. Hilda Lias is not here today and he will be back in the office on Tuesday.    Patient got upset and states this in NOT RIGHT you expect him to go all weekend with no pain medicine!! He said there isn't another doctor in the office today that can fill that prescription.  I advised him no he will have to wait for Dr. Hilda Lias to fill the prescription.    He wants to know the number to [facility] to file a complaint on the office.  I advised him I do not have the number and will have to get with my supervisor and get the number for him.   He said it is not right and he is going to have to go to the ER and wait all day or try to see another provider today to get some relief!    He said to expect a fax to transfer his records to another doctor in [place].  It is not right he has to go all weekend and not have something for his pain   I advised him I will get with my supervisor to get the number he needs to call in [place].    He asked if someone was really going to call him back I said yes.  Please call him back at [phone].

## 2021-05-07 ENCOUNTER — Telehealth: Payer: Self-pay | Admitting: Orthopaedic Surgery

## 2021-05-07 MED ORDER — HYDROCODONE-ACETAMINOPHEN 5-325 MG PO TABS: 1.0000 | ORAL_TABLET | ORAL | 0 refills | Status: AC | PRN

## 2021-05-07 NOTE — Telephone Encounter (Signed)
No new note. 

## 2021-05-07 NOTE — Telephone Encounter (Deleted)
Patient is requesting pain medicine to be called in. Patient states the Tramadol is not working.   He is requesting   HYDROcodone-acetaminophen (NORCO) 7.5-325 MG tablet    Patient does not want the traMADol (ULTRAM) 50 MG tablet   Pharmacy:  [facility]

## 2021-05-07 NOTE — Telephone Encounter (Signed)
Patient is requesting pain medicine to be called in. Patient states the Tramadol is not working.   He is requesting Hydrocodone 7.5  325 MG.  He does not want the Tramadol, he says that does not work for him.  I asked him for his phone number to verify in case someone from the office needs to call him back.  He asked me why do I need his phone number.  He also wants to know if Dr. Hilda Lias is going to change it today since I told him last week that Dr. Hilda Lias is here only on Tuesday and Thursday.  Advised him that is up to the doctor I can't advise what medicine will be called in for him   He said he doesn't want to have to call us 14 times to find out about his medicine.     Pharmacy:  [facility]

## 2021-05-09 NOTE — Telephone Encounter (Signed)
Patient is asking for  Hydrocodone-Acetaminophen 7.5/325 mg   PATIENT USES  [facility]

## 2021-05-28 ENCOUNTER — Ambulatory Visit: Payer: Commercial Managed Care - PPO | Admitting: Orthopaedic Surgery

## 2021-05-28 ENCOUNTER — Encounter: Payer: Self-pay | Admitting: Orthopaedic Surgery

## 2021-06-17 ENCOUNTER — Telehealth: Payer: Self-pay | Admitting: Orthopaedic Surgery

## 2021-06-17 NOTE — Telephone Encounter (Signed)
Patient call (message) regarding process for sending records to a provider for second opinion. Reached patient's wife - she will have patient call for [place] to relay protocol of signing release.

## 2021-07-08 ENCOUNTER — Other Ambulatory Visit: Payer: Self-pay

## 2021-07-08 ENCOUNTER — Ambulatory Visit
Admission: EM | Admit: 2021-07-08 | Discharge: 2021-07-08 | Disposition: A | Discharge status: Home / Self Care | Payer: Commercial Managed Care - PPO | Attending: Family Medicine | Admitting: Family Medicine

## 2021-07-08 ENCOUNTER — Emergency Department (HOSPITAL_COMMUNITY)
Admission: EM | Admit: 2021-07-08 | Discharge: 2021-07-08 | Disposition: A | Discharge status: Home / Self Care | Payer: Commercial Managed Care - PPO | Attending: Emergency Medicine | Admitting: Emergency Medicine

## 2021-07-08 ENCOUNTER — Encounter: Payer: Self-pay | Admitting: Emergency Medicine

## 2021-07-08 ENCOUNTER — Encounter (HOSPITAL_COMMUNITY): Payer: Self-pay | Admitting: Emergency Medicine

## 2021-07-08 ENCOUNTER — Emergency Department (HOSPITAL_COMMUNITY): Payer: Commercial Managed Care - PPO

## 2021-07-08 DIAGNOSIS — R079 Chest pain, unspecified: Secondary | ICD-10-CM | POA: Insufficient documentation

## 2021-07-08 DIAGNOSIS — R1013 Epigastric pain: Secondary | ICD-10-CM

## 2021-07-08 DIAGNOSIS — Z5321 Procedure and treatment not carried out due to patient leaving prior to being seen by health care provider: Secondary | ICD-10-CM | POA: Insufficient documentation

## 2021-07-08 DIAGNOSIS — K30 Functional dyspepsia: Secondary | ICD-10-CM | POA: Diagnosis not present

## 2021-07-08 HISTORY — DX: Gastro-esophageal reflux disease without esophagitis: K21.9

## 2021-07-08 IMAGING — DX DG CHEST 2V
2 series · 2 of 2 positions shown · non-contrast
Comparison: [DATE]

CLINICAL DATA: Right lower chest pain

EXAM:
CHEST - 2 VIEW

[chest pa]
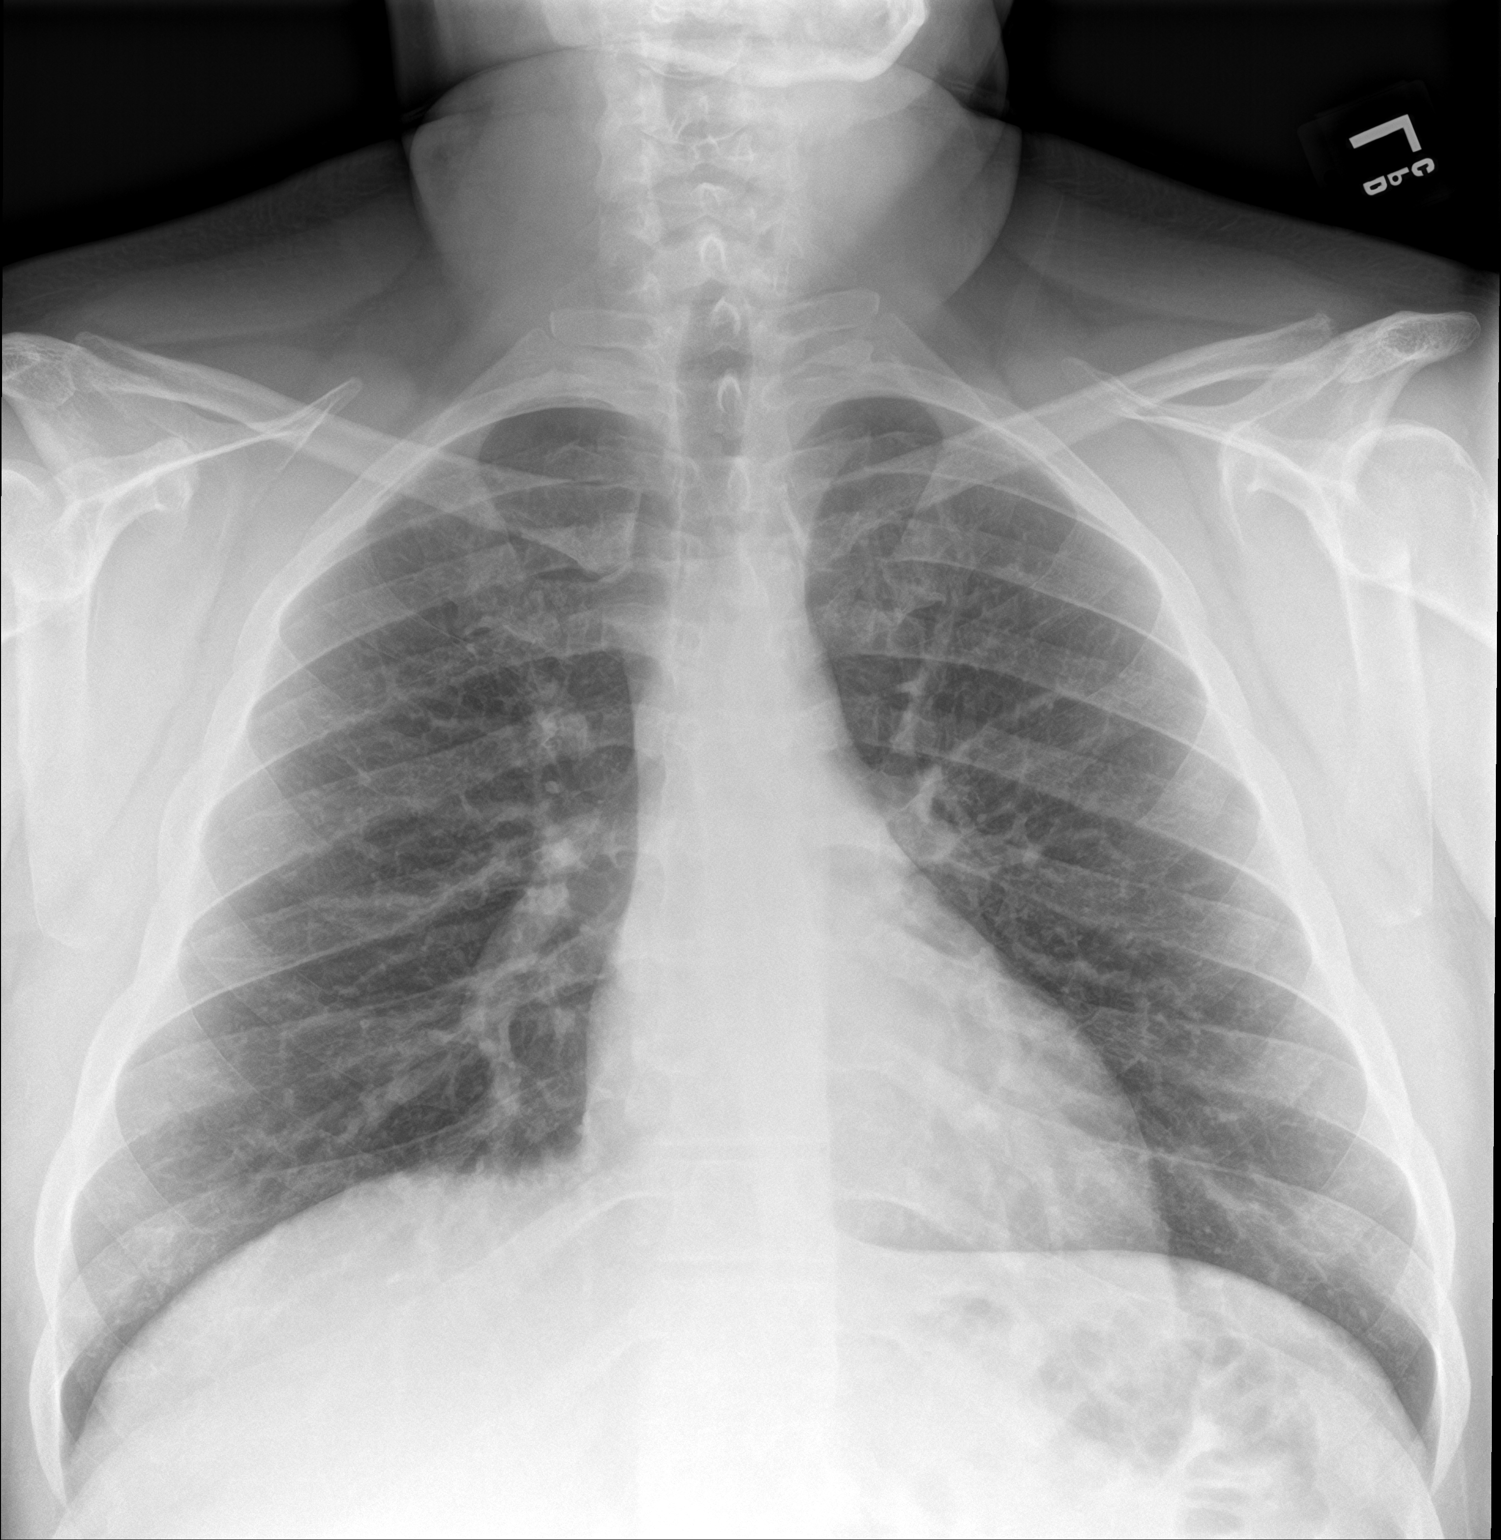

[chest lat]
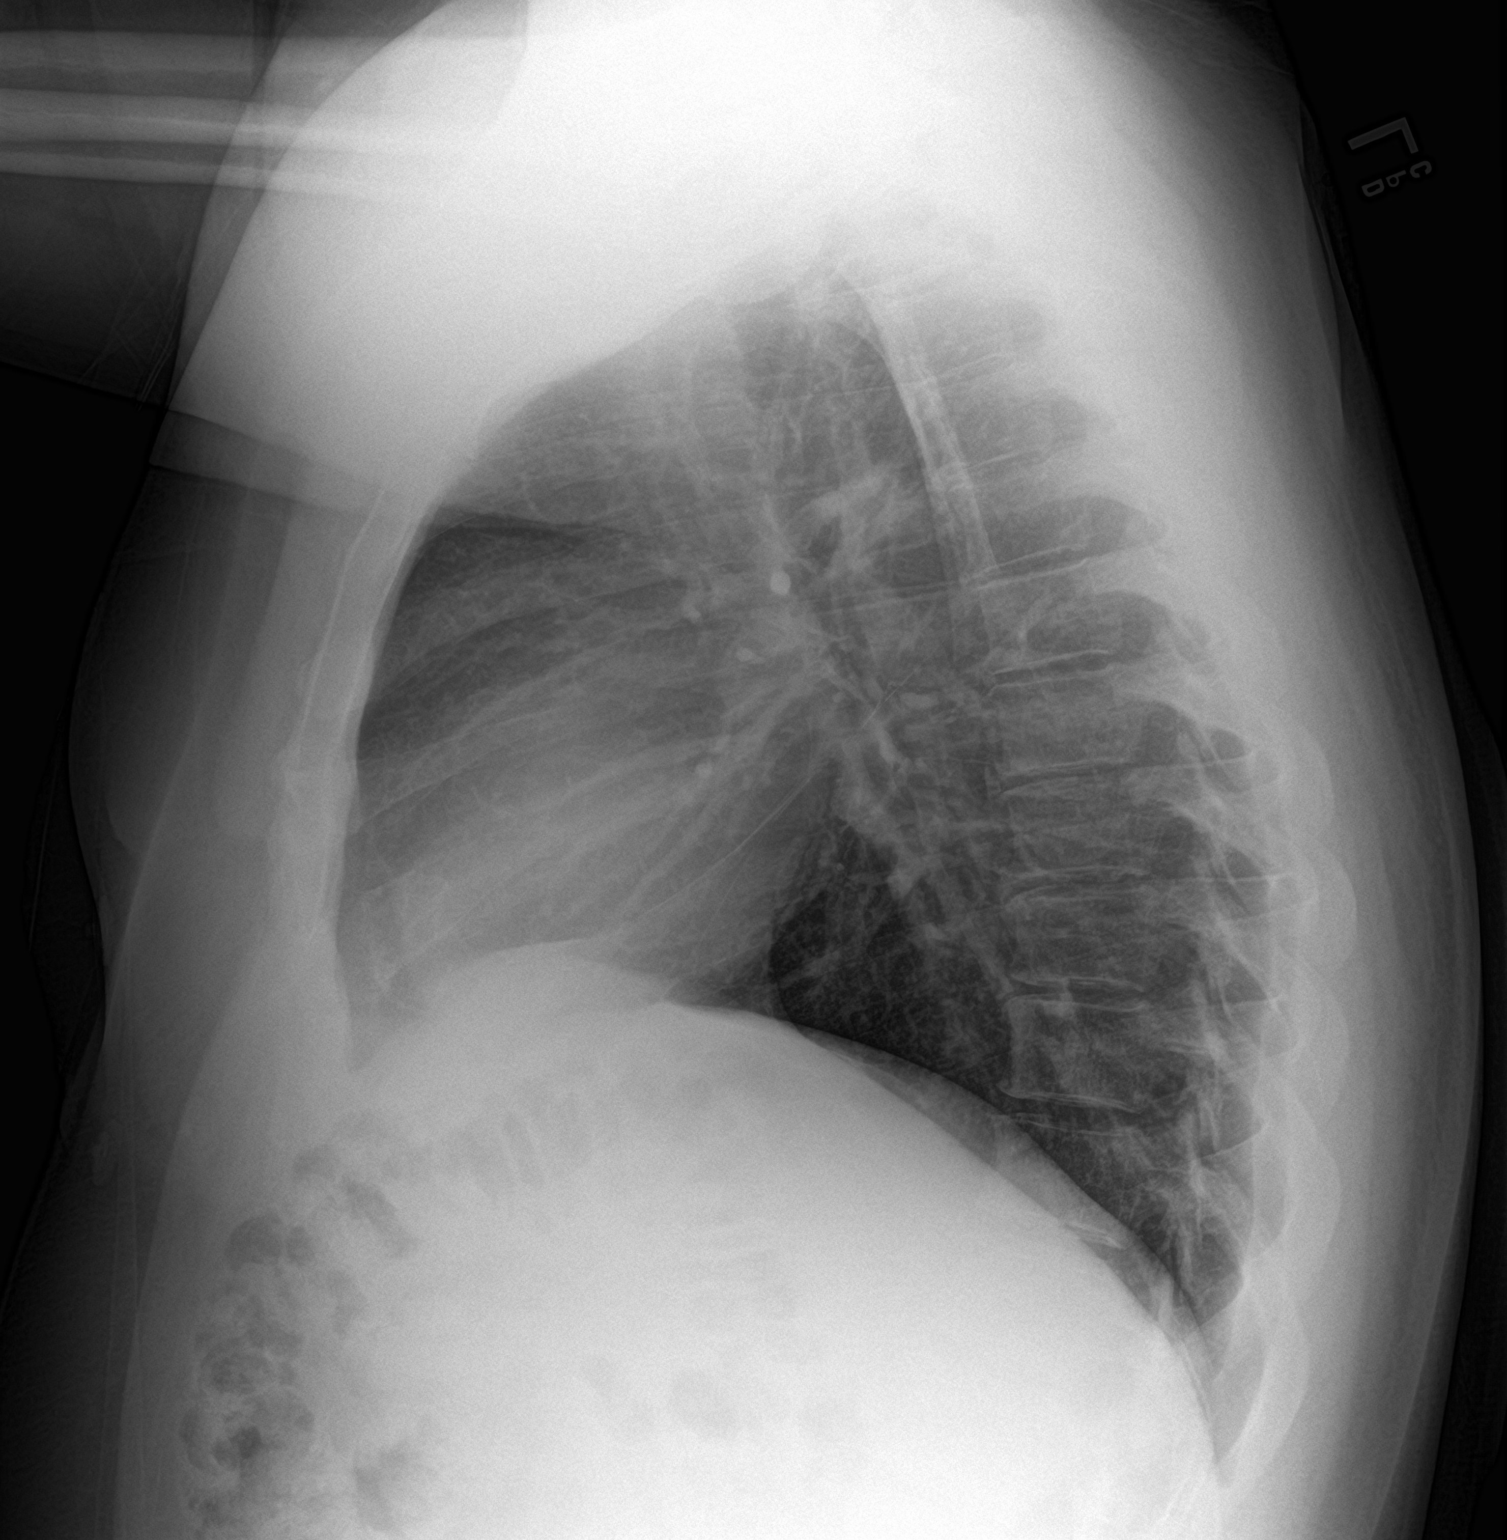

[2 of 2 positions shown; findings below may reference images not displayed]

FINDINGS: Lungs are clear.  No pleural effusion or pneumothorax.

The heart is normal in size.

Visualized osseous structures are within normal limits.
IMPRESSION: Normal chest radiographs.

## 2021-07-08 MED ORDER — ONDANSETRON 4 MG PO TBDP: 4.0000 mg | ORAL_TABLET | Freq: Three times a day (TID) | ORAL | 0 refills | Status: DC | PRN

## 2021-07-08 MED ORDER — DICYCLOMINE HCL 20 MG PO TABS: 20.0000 mg | ORAL_TABLET | Freq: Two times a day (BID) | ORAL | 0 refills | Status: DC | PRN

## 2021-07-08 MED ORDER — ONDANSETRON 4 MG PO TBDP
4.0000 mg | ORAL_TABLET | Freq: Once | ORAL | Status: AC
  Administered 2021-07-08: 4 mg via ORAL

## 2021-07-08 MED ORDER — ALUM & MAG HYDROXIDE-SIMETH 200-200-20 MG/5ML PO SUSP
30.0000 mL | Freq: Once | ORAL | Status: AC
  Administered 2021-07-08: 30 mL via ORAL

## 2021-07-08 MED ORDER — FAMOTIDINE 40 MG PO TABS: 40.0000 mg | ORAL_TABLET | Freq: Two times a day (BID) | ORAL | 0 refills | Status: DC

## 2021-07-08 MED ORDER — LIDOCAINE VISCOUS HCL 2 % MT SOLN
15.0000 mL | Freq: Once | OROMUCOSAL | Status: AC
  Administered 2021-07-08: 15 mL via ORAL

## 2021-07-08 NOTE — ED Triage Notes (Signed)
Pt states he woke up with R sided chest pain that he describes as "stabbing pain" and worse with deep breaths. Denies any other symptoms.

## 2021-07-08 NOTE — ED Triage Notes (Signed)
Burning pain to epigastric area and nausea that started around midnight.  Went to ED but left d/t long wait.

## 2021-07-08 NOTE — ED Provider Notes (Signed)
[facility]    CSN: [id number] Arrival date & time: 07/08/21  1761      History   Chief Complaint No chief complaint on file.   HPI David Boyd is a 35 y.o. male.   HPI Patient presents today for evaluation of epigastric pain right to mid epigastric.  Symptoms have been present since midnight.  Patient endorses eating tacos for dinner.  He also has a documented history of acid reflux.  He is felt nauseous although has not vomited.  He reports the pain at 1 point was sharp and severe however after awakening this morning it seems to have eased off although has not completely resolved.  He was seen at the emergency department overnight however it appeared that they were working him up more so for chest pain and not abdominal pain.  He left without being seen by the provider so therefore he was not evaluated.  He has been given a GI cocktail since arriving here at the urgent care and reports no significant change in symptoms or relief.  He is afebrile.  Endorses last bowel movement yesterday which was loose however he has had loose stools for over a month due to recent change in diet as he is intermittently fasting.   Patient Active Problem List   Diagnosis Date Noted   Umbilical hernia without obstruction and without gangrene    Unspecified constipation 05/11/2014   Rectal pain 05/11/2014    Past Surgical History:  Procedure Laterality Date   COLONOSCOPY N/A 06/01/2014   Procedure: COLONOSCOPY;  Surgeon: Malissa Hippo, MD;  Location: [facility];  Service: Endoscopy;  Laterality: N/A;  730   Pilondial cyst removed     PILONIDAL CYST EXCISION     [facility]- Dr Malvin Johns   UMBILICAL HERNIA REPAIR N/A 05/21/2020   Procedure: HERNIA REPAIR UMBILICAL ADULT;  Surgeon: Franky Macho, MD;  Location: [facility];  Service: General;  Laterality: N/A;       Home Medications    Prior to Admission medications   Medication Sig Start Date End Date Taking? Authorizing Provider   dicyclomine (BENTYL) 20 MG tablet Take 1 tablet (20 mg total) by mouth 3 times/day as needed-between meals & bedtime for spasms. 07/08/21  Yes Bing Neighbors, FNP  famotidine (PEPCID) 40 MG tablet Take 1 tablet (40 mg total) by mouth 2 (two) times daily for 10 days. 07/08/21 07/18/21 Yes Bing Neighbors, FNP  ondansetron (ZOFRAN ODT) 4 MG disintegrating tablet Take 1 tablet (4 mg total) by mouth every 8 (eight) hours as needed for nausea or vomiting. 07/08/21  Yes Bing Neighbors, FNP  omeprazole (PRILOSEC) 20 MG capsule Take 1 capsule (20 mg total) by mouth daily. 05/27/20   Glynn Octave, MD    Family History No family history on file.  Social History Social History   Tobacco Use   Smoking status: Never   Smokeless tobacco: Never  Substance Use Topics   Alcohol use: Yes    Comment: occasional   Drug use: No     Allergies   Patient has no known allergies.   Review of Systems Review of Systems Pertinent negatives listed in HPI   Physical Exam Triage Vital Signs ED Triage Vitals  Enc Vitals Group     BP 07/08/21 0939 127/81     Pulse Rate 07/08/21 0939 (!) 54     Resp 07/08/21 0939 18     Temp 07/08/21 0939 97.8 F (36.6 C)  Temp Source 07/08/21 0939 Oral     SpO2 07/08/21 0939 98 %     Weight --      Height --      Head Circumference --      Peak Flow --      Pain Score 07/08/21 0941 8     Pain Loc --      Pain Edu? --      Excl. in GC? --    No data found.  Updated Vital Signs BP 127/81 (BP Location: Right Arm)   Pulse (!) 54   Temp 97.8 F (36.6 C) (Oral)   Resp 18   SpO2 98%   Visual Acuity Right Eye Distance:   Left Eye Distance:   Bilateral Distance:    Right Eye Near:   Left Eye Near:    Bilateral Near:     Physical Exam Constitutional:      Appearance: He is obese.  HENT:     Head: Normocephalic.  Cardiovascular:     Rate and Rhythm: Normal rate and regular rhythm.  Pulmonary:     Effort: Pulmonary effort is normal.      Breath sounds: Normal breath sounds.  Abdominal:     General: There is distension.     Tenderness: There is abdominal tenderness in the right upper quadrant and epigastric area. There is no right CVA tenderness, left CVA tenderness, guarding or rebound. Negative signs include Rovsing's sign, McBurney's sign and psoas sign.  Skin:    Capillary Refill: Capillary refill takes less than 2 seconds.  Neurological:     General: No focal deficit present.     Mental Status: He is alert and oriented to person, place, and time.  Psychiatric:        Mood and Affect: Mood normal.        Thought Content: Thought content normal.        Judgment: Judgment normal.     UC Treatments / Results  Labs (all labs ordered are listed, but only abnormal results are displayed) Labs Reviewed - No data to display  EKG   Radiology DG Chest 2 View  Result Date: 07/08/2021 CLINICAL DATA:  Right lower chest pain EXAM: CHEST - 2 VIEW COMPARISON:  05/27/2020 FINDINGS: Lungs are clear.  No pleural effusion or pneumothorax. The heart is normal in size. Visualized osseous structures are within normal limits. IMPRESSION: Normal chest radiographs. Electronically Signed   By: Charline Bills M.D.   On: 07/08/2021 02:15    Procedures Procedures (including critical care time)  Medications Ordered in UC Medications  alum & mag hydroxide-simeth (MAALOX/MYLANTA) 200-200-20 MG/5ML suspension 30 mL (30 mLs Oral Given 07/08/21 0950)    And  lidocaine (XYLOCAINE) 2 % viscous mouth solution 15 mL (15 mLs Oral Given 07/08/21 0950)  ondansetron (ZOFRAN-ODT) disintegrating tablet 4 mg (4 mg Oral Given 07/08/21 1016)    Initial Impression / Assessment and Plan / UC Course  I have reviewed the triage vital signs and the nursing notes.  Pertinent labs & imaging results that were available during my care of the patient were reviewed by me and considered in my medical decision making (see chart for details).    Patient with  abdominal pain involving the mid epigastric region most likely consistent with acid indigestion given hyperactive bowel sounds, previous ingestion of tacos prior to the onset of symptoms and history of GERD.  No reproducible tenderness elicited on abdominal exam which decreases likelihood of an acute abdomen.  Patient's vital signs are overall stable.  Recommended GI rest over the next 24 hours dicyclomine for abdominal spasms, Zofran for nausea, and high-dose famotidine as an acid suppressant.  ER precautions given if symptoms become severe.  Otherwise follow-up with PCP as needed. Final Clinical Impressions(s) / UC Diagnoses   Final diagnoses:  Acid indigestion  Epigastric pain     Discharge Instructions      Start famotidine 40 mg twice daily and take for 5 days.  If symptoms have completely resolved no need to restart and can take famotidine as needed if you have any mild abdominal pain or indigestion type symptoms.  Zofran every 8 hours as needed for nausea.  Bentyl prescribed for any abdominal cramping or abdominal spasms as needed if your abdominal pain becomes severe, sharp stabbing or severe go immediately to the emergency department.     ED Prescriptions     Medication Sig Dispense Auth. Provider   famotidine (PEPCID) 40 MG tablet Take 1 tablet (40 mg total) by mouth 2 (two) times daily for 10 days. 20 tablet Bing Neighbors, FNP   dicyclomine (BENTYL) 20 MG tablet Take 1 tablet (20 mg total) by mouth 3 times/day as needed-between meals & bedtime for spasms. 20 tablet Bing Neighbors, FNP   ondansetron (ZOFRAN ODT) 4 MG disintegrating tablet Take 1 tablet (4 mg total) by mouth every 8 (eight) hours as needed for nausea or vomiting. 20 tablet Bing Neighbors, FNP      PDMP not reviewed this encounter.   Bing Neighbors, FNP 07/08/21 1050

## 2021-07-08 NOTE — ED Provider Notes (Signed)
Patient left ed without being seen by a provider.    David Boyd, [name], MD 07/08/21 [phone]

## 2021-07-08 NOTE — Discharge Instructions (Addendum)
Start famotidine 40 mg twice daily and take for 5 days.  If symptoms have completely resolved no need to restart and can take famotidine as needed if you have any mild abdominal pain or indigestion type symptoms.  Zofran every 8 hours as needed for nausea.  Bentyl prescribed for any abdominal cramping or abdominal spasms as needed if your abdominal pain becomes severe, sharp stabbing or severe go immediately to the emergency department.

## 2021-07-23 ENCOUNTER — Encounter: Payer: Self-pay | Admitting: Orthopaedic Surgery

## 2021-07-23 ENCOUNTER — Other Ambulatory Visit: Payer: Self-pay

## 2021-07-23 ENCOUNTER — Ambulatory Visit (INDEPENDENT_AMBULATORY_CARE_PROVIDER_SITE_OTHER): Payer: Commercial Managed Care - PPO | Admitting: Orthopaedic Surgery

## 2021-07-23 VITALS — BP 161/108 | HR 59 | Ht 71.0 in | Wt 251.2 lb

## 2021-07-23 DIAGNOSIS — G8929 Other chronic pain: Secondary | ICD-10-CM | POA: Diagnosis not present

## 2021-07-23 DIAGNOSIS — M25562 Pain in left knee: Secondary | ICD-10-CM | POA: Diagnosis not present

## 2021-07-23 MED ORDER — HYDROCODONE-ACETAMINOPHEN 7.5-325 MG PO TABS: 1.0000 | ORAL_TABLET | ORAL | 0 refills | Status: DC | PRN

## 2021-07-23 MED ORDER — CYCLOBENZAPRINE HCL 10 MG PO TABS: 10.0000 mg | ORAL_TABLET | Freq: Every day | ORAL | 0 refills | Status: DC

## 2021-07-23 NOTE — Progress Notes (Signed)
My knee hurts but not as much as it was.  He has bilateral knee pain, more on the left.  He has been on a weight reduction diet and has lost 25 pounds since last seen here in June.  He has no given way of the left knee, no new trauma.  Left knee has slight effusion and crepitus, ROM 0 to 110, stable.  NV intact.  Encounter Diagnosis  Name Primary?   Chronic pain of left knee Yes   Continue the weight reduction.  I will refill medicine.  I have reviewed the [place] Controlled Substance Reporting System web site prior to prescribing narcotic medicine for this patient.  Return in three months.  Call if any problem.  Precautions discussed.  Electronically Signed Darreld Mclean, MD 9/13/20229:35 AM

## 2021-10-22 ENCOUNTER — Ambulatory Visit: Payer: Commercial Managed Care - PPO | Admitting: Orthopaedic Surgery

## 2021-11-14 ENCOUNTER — Ambulatory Visit: Payer: Commercial Managed Care - PPO | Admitting: Orthopaedic Surgery

## 2021-11-19 ENCOUNTER — Ambulatory Visit: Payer: Commercial Managed Care - PPO | Admitting: Orthopaedic Surgery

## 2021-11-21 ENCOUNTER — Ambulatory Visit (INDEPENDENT_AMBULATORY_CARE_PROVIDER_SITE_OTHER): Payer: Commercial Managed Care - PPO | Admitting: Orthopaedic Surgery

## 2021-11-21 ENCOUNTER — Encounter: Payer: Self-pay | Admitting: Orthopaedic Surgery

## 2021-11-21 ENCOUNTER — Other Ambulatory Visit: Payer: Self-pay

## 2021-11-21 VITALS — BP 130/80 | HR 63 | Ht 71.0 in | Wt 238.0 lb

## 2021-11-21 DIAGNOSIS — G8929 Other chronic pain: Secondary | ICD-10-CM

## 2021-11-21 DIAGNOSIS — M25562 Pain in left knee: Secondary | ICD-10-CM | POA: Diagnosis not present

## 2021-11-21 MED ORDER — HYDROCODONE-ACETAMINOPHEN 7.5-325 MG PO TABS: 1.0000 | ORAL_TABLET | ORAL | 0 refills | Status: DC | PRN

## 2021-11-21 NOTE — Progress Notes (Signed)
My knee is sore  He has had more pain in the left knee with the colder weather.  He has no new trauma.  He continues to lose weight.  He weighs 238 today, down from 252 last time.  He has noticed his knee pain is less.  He has no giving way.  Left knee has crepitus, no effusion today, ROM 0 to 110, no limp, NV intact.  Encounter Diagnosis  Name Primary?   Chronic pain of left knee Yes   I will renew pain medicine.  I have reviewed the [place] web site prior to prescribing narcotic medicine for this patient.  Continue weight reduction and exercises.  Return in three months.  Call if any problem.  Precautions discussed.  Electronically Signed Sanjuana Kava, MD 1/12/20239:44 AM

## 2022-01-16 ENCOUNTER — Emergency Department (HOSPITAL_COMMUNITY)
Admission: EM | Admit: 2022-01-16 | Discharge: 2022-01-16 | Discharge status: Against Medical Advice | Payer: Commercial Managed Care - PPO

## 2022-01-17 ENCOUNTER — Other Ambulatory Visit: Payer: Self-pay

## 2022-01-17 ENCOUNTER — Emergency Department (HOSPITAL_COMMUNITY): Payer: Commercial Managed Care - PPO

## 2022-01-17 ENCOUNTER — Encounter (HOSPITAL_COMMUNITY): Payer: Self-pay

## 2022-01-17 ENCOUNTER — Ambulatory Visit: Payer: Self-pay

## 2022-01-17 ENCOUNTER — Telehealth: Payer: Self-pay | Admitting: Orthopedic Surgery

## 2022-01-17 ENCOUNTER — Emergency Department (HOSPITAL_COMMUNITY)
Admission: EM | Admit: 2022-01-17 | Discharge: 2022-01-17 | Disposition: A | Discharge status: Home / Self Care | Payer: Commercial Managed Care - PPO | Attending: Student | Admitting: Student

## 2022-01-17 DIAGNOSIS — R079 Chest pain, unspecified: Secondary | ICD-10-CM | POA: Insufficient documentation

## 2022-01-17 DIAGNOSIS — S301XXA Contusion of abdominal wall, initial encounter: Secondary | ICD-10-CM | POA: Insufficient documentation

## 2022-01-17 DIAGNOSIS — R739 Hyperglycemia, unspecified: Secondary | ICD-10-CM | POA: Diagnosis not present

## 2022-01-17 DIAGNOSIS — S82832A Other fracture of upper and lower end of left fibula, initial encounter for closed fracture: Secondary | ICD-10-CM | POA: Insufficient documentation

## 2022-01-17 DIAGNOSIS — Y92410 Unspecified street and highway as the place of occurrence of the external cause: Secondary | ICD-10-CM | POA: Diagnosis not present

## 2022-01-17 DIAGNOSIS — Z23 Encounter for immunization: Secondary | ICD-10-CM | POA: Insufficient documentation

## 2022-01-17 DIAGNOSIS — S8992XA Unspecified injury of left lower leg, initial encounter: Secondary | ICD-10-CM | POA: Diagnosis present

## 2022-01-17 DIAGNOSIS — R519 Headache, unspecified: Secondary | ICD-10-CM | POA: Diagnosis not present

## 2022-01-17 DIAGNOSIS — M79601 Pain in right arm: Secondary | ICD-10-CM

## 2022-01-17 DIAGNOSIS — S4991XA Unspecified injury of right shoulder and upper arm, initial encounter: Secondary | ICD-10-CM | POA: Insufficient documentation

## 2022-01-17 LAB — CBC WITH DIFFERENTIAL/PLATELET
Abs Immature Granulocytes: 0.03 10*3/uL (ref 0.00–0.07)
Basophils Absolute: 0.1 10*3/uL (ref 0.0–0.1)
Basophils Relative: 1 %
Eosinophils Absolute: 0.2 10*3/uL (ref 0.0–0.5)
Eosinophils Relative: 2 %
HCT: 44.1 % (ref 39.0–52.0)
Hemoglobin: 15.3 g/dL (ref 13.0–17.0)
Immature Granulocytes: 0 %
Lymphocytes Relative: 12 %
Lymphs Abs: 1.2 10*3/uL (ref 0.7–4.0)
MCH: 30.8 pg (ref 26.0–34.0)
MCHC: 34.7 g/dL (ref 30.0–36.0)
MCV: 88.9 fL (ref 80.0–100.0)
Monocytes Absolute: 0.7 10*3/uL (ref 0.1–1.0)
Monocytes Relative: 7 %
Neutro Abs: 7.9 10*3/uL — ABNORMAL HIGH (ref 1.7–7.7)
Neutrophils Relative %: 78 %
Platelets: 171 10*3/uL (ref 150–400)
RBC: 4.96 MIL/uL (ref 4.22–5.81)
RDW: 12.4 % (ref 11.5–15.5)
WBC: 10 10*3/uL (ref 4.0–10.5)
nRBC: 0 % (ref 0.0–0.2)

## 2022-01-17 LAB — BASIC METABOLIC PANEL
Anion gap: 10 (ref 5–15)
BUN: 17 mg/dL (ref 6–20)
CO2: 25 mmol/L (ref 22–32)
Calcium: 8.9 mg/dL (ref 8.9–10.3)
Chloride: 101 mmol/L (ref 98–111)
Creatinine, Ser: 0.81 mg/dL (ref 0.61–1.24)
GFR, Estimated: >60 mL/min (ref >60)
Glucose, Bld: 110 mg/dL — ABNORMAL HIGH (ref 70–99)
Potassium: 3.5 mmol/L (ref 3.5–5.1)
Sodium: 136 mmol/L (ref 135–145)
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
## 2022-01-17 MED ORDER — TETANUS-DIPHTH-ACELL PERTUSSIS 5-2.5-18.5 LF-MCG/0.5 IM SUSY
0.5000 mL | PREFILLED_SYRINGE | Freq: Once | INTRAMUSCULAR | Status: AC
  Administered 2022-01-17: 0.5 mL via INTRAMUSCULAR
  Filled 2022-01-17: qty 0.5

## 2022-01-17 MED ORDER — FENTANYL CITRATE PF 50 MCG/ML IJ SOSY
50.0000 ug | PREFILLED_SYRINGE | Freq: Once | INTRAMUSCULAR | Status: AC
  Administered 2022-01-17: 50 ug via INTRAVENOUS
  Filled 2022-01-17: qty 1

## 2022-01-17 MED ORDER — OXYCODONE-ACETAMINOPHEN 5-325 MG PO TABS: 1.0000 | ORAL_TABLET | Freq: Four times a day (QID) | ORAL | 0 refills | Status: DC | PRN

## 2022-01-17 MED ORDER — IOHEXOL 300 MG/ML  SOLN
100.0000 mL | Freq: Once | INTRAMUSCULAR | Status: AC | PRN
  Administered 2022-01-17: 100 mL via INTRAVENOUS

## 2022-01-17 MED ORDER — FENTANYL CITRATE PF 50 MCG/ML IJ SOSY
12.5000 ug | PREFILLED_SYRINGE | Freq: Once | INTRAMUSCULAR | Status: AC
Start: 2022-01-17 — End: 2022-01-17
  Administered 2022-01-17: 12.5 ug via INTRAVENOUS
  Filled 2022-01-17: qty 1

## 2022-01-17 NOTE — Discharge Instructions (Addendum)
Please take 600 mg of ibuprofen every 6 hours for pain control.  Percocet for breakthrough pain.  Follow-up with orthopedics. ? ?It seems that you are prescribed hydrocodone.  Please take those as needed for breakthrough pain. ?
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
## 2022-01-20 ENCOUNTER — Ambulatory Visit (INDEPENDENT_AMBULATORY_CARE_PROVIDER_SITE_OTHER): Payer: Commercial Managed Care - PPO | Admitting: Orthopedic Surgery

## 2022-01-20 ENCOUNTER — Encounter: Payer: Self-pay | Admitting: Orthopedic Surgery

## 2022-01-20 ENCOUNTER — Other Ambulatory Visit: Payer: Self-pay

## 2022-01-20 DIAGNOSIS — M25562 Pain in left knee: Secondary | ICD-10-CM | POA: Diagnosis not present

## 2022-01-20 DIAGNOSIS — M25511 Pain in right shoulder: Secondary | ICD-10-CM | POA: Diagnosis not present

## 2022-01-20 MED ORDER — IBUPROFEN 800 MG PO TABS: 800.0000 mg | ORAL_TABLET | Freq: Three times a day (TID) | ORAL | 1 refills | Status: DC | PRN

## 2022-01-20 MED ORDER — TIZANIDINE HCL 4 MG PO TABS: 4.0000 mg | ORAL_TABLET | Freq: Every day | ORAL | 1 refills | Status: AC

## 2022-01-20 MED ORDER — OXYCODONE-ACETAMINOPHEN 5-325 MG PO TABS: 1.0000 | ORAL_TABLET | Freq: Four times a day (QID) | ORAL | 0 refills | Status: AC | PRN

## 2022-01-20 NOTE — Patient Instructions (Signed)
Ice left knee 3 x a day for 30 min  ? ?Sleep in recliner  ? ? ?
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
## 2022-01-21 ENCOUNTER — Encounter: Payer: Self-pay | Admitting: Orthopedic Surgery

## 2022-01-27 ENCOUNTER — Ambulatory Visit (INDEPENDENT_AMBULATORY_CARE_PROVIDER_SITE_OTHER): Payer: Commercial Managed Care - PPO | Admitting: Orthopedic Surgery

## 2022-01-27 ENCOUNTER — Other Ambulatory Visit: Payer: Self-pay

## 2022-01-27 DIAGNOSIS — M25511 Pain in right shoulder: Secondary | ICD-10-CM

## 2022-01-27 DIAGNOSIS — M25562 Pain in left knee: Secondary | ICD-10-CM

## 2022-01-27 MED ORDER — HYDROCODONE-ACETAMINOPHEN 5-325 MG PO TABS
1.0000 | ORAL_TABLET | Freq: Three times a day (TID) | ORAL | 0 refills | Status: DC | PRN
Start: 2022-01-27 — End: 2022-02-10

## 2022-01-27 NOTE — Patient Instructions (Signed)
RTW supervisor role  ? ?Continue the ibuprofen  ? ?You can take the hydro-codone for break thru pain  ? ?The muscle relaxer take before bedtime and as needed  ? ?Wear knee brace when up and about  ? ?Do the knee and shoulder exercises daily  ? ? ?
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
## 2022-02-10 ENCOUNTER — Ambulatory Visit (INDEPENDENT_AMBULATORY_CARE_PROVIDER_SITE_OTHER): Payer: Commercial Managed Care - PPO | Admitting: Orthopedic Surgery

## 2022-02-10 DIAGNOSIS — S43431D Superior glenoid labrum lesion of right shoulder, subsequent encounter: Secondary | ICD-10-CM

## 2022-02-10 DIAGNOSIS — M25511 Pain in right shoulder: Secondary | ICD-10-CM

## 2022-02-10 MED ORDER — HYDROCODONE-ACETAMINOPHEN 5-325 MG PO TABS: 1.0000 | ORAL_TABLET | Freq: Every day | ORAL | 0 refills | Status: AC

## 2022-02-10 NOTE — Patient Instructions (Addendum)
Schedule MRI  ? ?While we are working on your approval for MRI please go ahead and call to schedule your appointment with [facility] within at least 2 weeks ? ?Central Scheduling ?[phone]  ?
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
## 2022-02-10 NOTE — Addendum Note (Signed)
Addended by: Recardo Evangelist A on: 02/10/2022 04:11 PM ? ? Modules accepted: Orders ? ?

## 2022-02-20 ENCOUNTER — Ambulatory Visit: Payer: Commercial Managed Care - PPO | Admitting: Orthopaedic Surgery

## 2022-02-28 ENCOUNTER — Ambulatory Visit (HOSPITAL_COMMUNITY)
Admission: RE | Admit: 2022-02-28 | Discharge: 2022-02-28 | Disposition: A | Discharge status: Home / Self Care | Payer: Commercial Managed Care - PPO | Source: Ambulatory Visit | Attending: Orthopedic Surgery | Admitting: Orthopedic Surgery

## 2022-02-28 ENCOUNTER — Ambulatory Visit: Payer: Commercial Managed Care - PPO

## 2022-02-28 DIAGNOSIS — M25511 Pain in right shoulder: Secondary | ICD-10-CM | POA: Insufficient documentation

## 2022-02-28 DIAGNOSIS — S43431D Superior glenoid labrum lesion of right shoulder, subsequent encounter: Secondary | ICD-10-CM

## 2022-02-28 DIAGNOSIS — X58XXXD Exposure to other specified factors, subsequent encounter: Secondary | ICD-10-CM | POA: Insufficient documentation

## 2022-02-28 DIAGNOSIS — S43401D Unspecified sprain of right shoulder joint, subsequent encounter: Secondary | ICD-10-CM | POA: Diagnosis not present
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
## 2022-02-28 MED ORDER — LIDOCAINE HCL (PF) 1 % IJ SOLN
INTRAMUSCULAR | Status: AC
  Administered 2022-02-28: 5 mL
  Filled 2022-02-28: qty 5

## 2022-02-28 MED ORDER — GADOBUTROL 1 MMOL/ML IV SOLN
1.0000 mL | Freq: Once | INTRAVENOUS | Status: AC | PRN
  Administered 2022-02-28: 0.05 mL

## 2022-02-28 MED ORDER — IOHEXOL 180 MG/ML  SOLN
20.0000 mL | Freq: Once | INTRAMUSCULAR | Status: AC | PRN
  Administered 2022-02-28: 15 mL

## 2022-02-28 MED ORDER — SODIUM CHLORIDE (PF) 0.9 % IJ SOLN
INTRAMUSCULAR | Status: AC
  Administered 2022-02-28: 5 mL
  Filled 2022-02-28: qty 10

## 2022-03-03 ENCOUNTER — Other Ambulatory Visit: Payer: Self-pay | Admitting: Orthopedic Surgery

## 2022-03-03 ENCOUNTER — Encounter: Payer: Self-pay | Admitting: Orthopedic Surgery

## 2022-03-03 MED ORDER — IBUPROFEN 800 MG PO TABS: 800.0000 mg | ORAL_TABLET | Freq: Three times a day (TID) | ORAL | 1 refills | Status: AC | PRN

## 2022-03-03 MED ORDER — HYDROCODONE-ACETAMINOPHEN 5-325 MG PO TABS
1.0000 | ORAL_TABLET | Freq: Two times a day (BID) | ORAL | 0 refills | Status: DC | PRN
Start: 2022-03-03 — End: 2022-04-02

## 2022-03-03 NOTE — Progress Notes (Signed)
Meds ordered this encounter  ?Medications  ? ibuprofen (ADVIL) 800 MG tablet  ?  Sig: Take 1 tablet (800 mg total) by mouth every 8 (eight) hours as needed.  ?  Dispense:  90 tablet  ?  Refill:  1  ? HYDROcodone-acetaminophen (NORCO/VICODIN) 5-325 MG tablet  ?  Sig: Take 1 tablet by mouth every 12 (twelve) hours as needed for moderate pain.  ?  Dispense:  10 tablet  ?  Refill:  0  ? ? ?

## 2022-03-04 ENCOUNTER — Telehealth: Payer: Self-pay | Admitting: Orthopedic Surgery

## 2022-03-04 NOTE — Telephone Encounter (Signed)
Message left to call me on my cell phone  ?

## 2022-03-04 NOTE — Telephone Encounter (Signed)
Called wife spoke to her  ?

## 2022-03-04 NOTE — Telephone Encounter (Signed)
Spoke with patient and advised him that medication had been sent in for him yesterday.  ? ?He also stated that he had his MRI done on Friday and would have expected to hear something back before now. I advised him that not only is the report reviewed but the images as well, and unfortunately we can't count the weekend. I again advised provider would call with his results. He is upset because he has read the report on mychart. He stated he understands you are very busy, but he doesn't want to live off of pain pills.  ?

## 2022-03-04 NOTE — Telephone Encounter (Signed)
I spoke with the patient's wife she seemed to be reassured that we had called he I advised that he and her husband would probably need surgery ?

## 2022-03-04 NOTE — Telephone Encounter (Signed)
Call attempted

## 2022-03-04 NOTE — Telephone Encounter (Signed)
Patient wife called and said her husband is in a lot of pain and she wants to know if there is anything else that can be prescribed to him the pain medicine he is taking is not touching the pain at all.   ? ?Please call her back and talk to her about this.  She wants to talk to a person.  Or you can call the patient.  ?

## 2022-03-12 ENCOUNTER — Ambulatory Visit: Payer: Commercial Managed Care - PPO | Admitting: Orthopedic Surgery

## 2022-04-02 ENCOUNTER — Encounter: Payer: Self-pay | Admitting: Orthopedic Surgery

## 2022-04-02 ENCOUNTER — Ambulatory Visit (INDEPENDENT_AMBULATORY_CARE_PROVIDER_SITE_OTHER): Payer: Commercial Managed Care - PPO | Admitting: Orthopedic Surgery

## 2022-04-02 VITALS — BP 127/74 | HR 61 | Ht 71.0 in | Wt 230.0 lb

## 2022-04-02 DIAGNOSIS — S43431D Superior glenoid labrum lesion of right shoulder, subsequent encounter: Secondary | ICD-10-CM

## 2022-04-02 MED ORDER — HYDROCODONE-ACETAMINOPHEN 5-325 MG PO TABS: 1.0000 | ORAL_TABLET | Freq: Two times a day (BID) | ORAL | 0 refills | Status: DC | PRN
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
## 2022-04-02 NOTE — Addendum Note (Signed)
Addended byCandice Camp on: 04/02/2022 09:01 AM   Modules accepted: Orders

## 2022-05-05 ENCOUNTER — Other Ambulatory Visit: Payer: Self-pay | Admitting: Radiology

## 2022-05-12 MED ORDER — HYDROCODONE-ACETAMINOPHEN 5-325 MG PO TABS: 1.0000 | ORAL_TABLET | Freq: Two times a day (BID) | ORAL | 0 refills | Status: DC | PRN

## 2022-05-15 ENCOUNTER — Ambulatory Visit: Payer: Commercial Managed Care - PPO | Admitting: Orthopedic Surgery

## 2022-05-21 ENCOUNTER — Ambulatory Visit (HOSPITAL_COMMUNITY): Payer: Commercial Managed Care - PPO | Attending: Orthopedic Surgery

## 2022-06-05 ENCOUNTER — Ambulatory Visit (INDEPENDENT_AMBULATORY_CARE_PROVIDER_SITE_OTHER): Payer: Commercial Managed Care - PPO | Admitting: Orthopedic Surgery

## 2022-06-05 ENCOUNTER — Encounter: Payer: Self-pay | Admitting: Orthopedic Surgery

## 2022-06-05 DIAGNOSIS — S43431D Superior glenoid labrum lesion of right shoulder, subsequent encounter: Secondary | ICD-10-CM | POA: Diagnosis not present

## 2022-06-05 MED ORDER — HYDROCODONE-ACETAMINOPHEN 5-325 MG PO TABS: 1.0000 | ORAL_TABLET | Freq: Four times a day (QID) | ORAL | 0 refills | Status: AC | PRN

## 2022-06-05 NOTE — Progress Notes (Signed)
FOLLOW UP   Encounter Diagnosis  Name Primary?   Labral tear of shoulder, right, subsequent encounter Yes     Chief Complaint  Patient presents with   Shoulder Pain    Right /  has some pain at times, at night     David Boyd continues to improve he is lifting some light weights  He has some night pain still requires some hydrocodone for that but otherwise doing well  His exam shows that he is tight in abduction external rotation he regained all his motion his cuff is intact he has no apprehension  Refill hydrocodone follow-up 2 months  Meds ordered this encounter  Medications   HYDROcodone-acetaminophen (NORCO/VICODIN) 5-325 MG tablet    Sig: Take 1 tablet by mouth every 6 (six) hours as needed for up to 5 days for moderate pain.    Dispense:  20 tablet    Refill:  0

## 2022-07-15 ENCOUNTER — Encounter: Payer: Self-pay | Admitting: Orthopedic Surgery

## 2022-07-15 ENCOUNTER — Other Ambulatory Visit: Payer: Self-pay | Admitting: Orthopedic Surgery

## 2022-07-15 NOTE — Telephone Encounter (Signed)
Patient called to request refill on medication:   HYDROcodone-acetaminophen (NORCO/VICODIN) 5-325 MG tablet 20 tablet       [facility]

## 2022-07-16 MED ORDER — HYDROCODONE-ACETAMINOPHEN 5-325 MG PO TABS: 1.0000 | ORAL_TABLET | Freq: Every evening | ORAL | 0 refills | Status: DC | PRN

## 2022-07-30 ENCOUNTER — Ambulatory Visit: Payer: Commercial Managed Care - PPO | Admitting: Orthopedic Surgery

## 2022-10-20 ENCOUNTER — Emergency Department (HOSPITAL_COMMUNITY)
Admission: EM | Admit: 2022-10-20 | Discharge: 2022-10-20 | Disposition: A | Discharge status: Home / Self Care | Payer: Commercial Managed Care - PPO | Attending: Emergency Medicine | Admitting: Emergency Medicine

## 2022-10-20 ENCOUNTER — Other Ambulatory Visit: Payer: Self-pay

## 2022-10-20 ENCOUNTER — Encounter (HOSPITAL_COMMUNITY): Payer: Self-pay | Admitting: Emergency Medicine

## 2022-10-20 DIAGNOSIS — R1011 Right upper quadrant pain: Secondary | ICD-10-CM | POA: Insufficient documentation

## 2022-10-20 DIAGNOSIS — R1110 Vomiting, unspecified: Secondary | ICD-10-CM | POA: Insufficient documentation

## 2022-10-20 NOTE — ED Triage Notes (Signed)
Pt with c/o RUQ abdominal pain that radiates to back. States he thinks might be his gallbladder.

## 2022-10-20 NOTE — ED Provider Notes (Signed)
[facility] EMERGENCY DEPARTMENT Provider Note   CSN: [id number] Arrival date & time: 10/20/22  0121     History  Chief Complaint  Patient presents with   Abdominal Pain    David Boyd is a 36 y.o. male.  Patient presents to the emergency department for evaluation of vomiting.  Patient reports pain in the right upper radiates around back.  Patient reports that he has had several flares like this in the past.  He often takes ibuprofen and the pain goes away.  Pain was severe earlier tonight, but is now easing off.       Home Medications Prior to Admission medications   Medication Sig Start Date End Date Taking? Authorizing Provider  HYDROcodone-acetaminophen (NORCO/VICODIN) 5-325 MG tablet Take 1 tablet by mouth at bedtime as needed for moderate pain. Must last 1 month 07/16/22   Vickki Hearing, MD  ibuprofen (ADVIL) 800 MG tablet Take 1 tablet (800 mg total) by mouth every 8 (eight) hours as needed. 03/03/22   Vickki Hearing, MD  Multiple Vitamin (MULTIVITAMINS PO) Take 1 tablet by mouth daily. One a day mens    [provider]  Omega-3 Fatty Acids (FISH OIL PO) Take 1 tablet by mouth daily.    [provider]  tiZANidine (ZANAFLEX) 4 MG tablet Take 1 tablet (4 mg total) by mouth daily. 01/20/22 01/20/23  Vickki Hearing, MD  VITAMIN D PO Take 1 tablet by mouth daily.    [provider]      Allergies    Patient has no known allergies.    Review of Systems   Review of Systems  Physical Exam Updated Vital Signs BP (!) 153/84 (BP Location: Left Arm)   Pulse (!) 55   Temp 97.7 F (36.5 C) (Oral)   Resp 18   Ht 5\' 11"  (1.803 m)   Wt 99.8 kg   SpO2 99%   BMI 30.68 kg/m  Physical Exam Vitals and nursing note reviewed.  Constitutional:      General: He is not in acute distress.    Appearance: He is well-developed.  HENT:     Head: Normocephalic and atraumatic.     Mouth/Throat:     Mouth: Mucous membranes are moist.  Eyes:      General: Vision grossly intact. Gaze aligned appropriately.     Extraocular Movements: Extraocular movements intact.     Conjunctiva/sclera: Conjunctivae normal.  Cardiovascular:     Rate and Rhythm: Normal rate and regular rhythm.     Pulses: Normal pulses.     Heart sounds: Normal heart sounds, S1 normal and S2 normal. No murmur heard.    No friction rub. No gallop.  Pulmonary:     Effort: Pulmonary effort is normal. No respiratory distress.     Breath sounds: Normal breath sounds.  Abdominal:     Palpations: Abdomen is soft.     Tenderness: There is no abdominal tenderness. There is no guarding or rebound.     Hernia: No hernia is present.  Musculoskeletal:        General: No swelling.     Cervical back: Full passive range of motion without pain, normal range of motion and neck supple. No pain with movement, spinous process tenderness or muscular tenderness. Normal range of motion.     Right lower leg: No edema.     Left lower leg: No edema.  Skin:    General: Skin is warm and dry.  Capillary Refill: Capillary refill takes less than 2 seconds.     Findings: No ecchymosis, erythema, lesion or wound.  Neurological:     Mental Status: He is alert and oriented to person, place, and time.     GCS: GCS eye subscore is 4. GCS verbal subscore is 5. GCS motor subscore is 6.     Cranial Nerves: Cranial nerves 2-12 are intact.     Sensory: Sensation is intact.     Motor: Motor function is intact. No weakness or abnormal muscle tone.     Coordination: Coordination is intact.  Psychiatric:        Mood and Affect: Mood normal.        Speech: Speech normal.        Behavior: Behavior normal.     ED Results / Procedures / Treatments   Labs (all labs ordered are listed, but only abnormal results are displayed) Labs Reviewed  LIPASE, BLOOD  COMPREHENSIVE METABOLIC PANEL  CBC  URINALYSIS, ROUTINE W REFLEX MICROSCOPIC    EKG None  Radiology No results  found.  Procedures Procedures    Medications Ordered in ED Medications - No data to display  ED Course/ Medical Decision Making/ A&P                           Medical Decision Making Amount and/or Complexity of Data Reviewed Labs: ordered.   Right upper quadrant pain.  Discussed possibility of gallbladder disease  Patient reports that he is feeling much better now and does not want to have a workup.  Exam is nonfocal, no sign of acute surgical process.  Patient does report that he has a primary doctor that he can follow-up with.  Will discharge, patient counseled to return immediately if his pain returns.  His main reason for leaving is he has to work in the morning.  Therefore I cannot schedule outpatient ultrasound in the morning.        Final Clinical Impression(s) / ED Diagnoses Final diagnoses:  Right upper quadrant abdominal pain    Rx / DC Orders ED Discharge Orders     None         Gilda Crease, MD 10/20/22 [phone]

## 2022-12-02 ENCOUNTER — Other Ambulatory Visit (HOSPITAL_COMMUNITY): Payer: Self-pay | Admitting: Physician Assistant

## 2022-12-02 DIAGNOSIS — R1011 Right upper quadrant pain: Secondary | ICD-10-CM

## 2022-12-09 ENCOUNTER — Ambulatory Visit (HOSPITAL_COMMUNITY)
Admission: RE | Admit: 2022-12-09 | Discharge: 2022-12-09 | Disposition: A | Discharge status: Home / Self Care | Payer: Commercial Managed Care - PPO | Source: Ambulatory Visit | Attending: Physician Assistant | Admitting: Physician Assistant

## 2022-12-09 ENCOUNTER — Encounter (HOSPITAL_COMMUNITY): Payer: Self-pay

## 2022-12-09 DIAGNOSIS — R1011 Right upper quadrant pain: Secondary | ICD-10-CM | POA: Diagnosis present

## 2022-12-09 MED ORDER — STERILE WATER FOR INJECTION IJ SOLN
INTRAMUSCULAR | Status: AC
  Administered 2022-12-09: 2.07 mL via INTRAVENOUS
  Filled 2022-12-09: qty 10

## 2022-12-09 MED ORDER — SINCALIDE 5 MCG IJ SOLR
INTRAMUSCULAR | Status: AC
  Administered 2022-12-09: 2.07 ug via INTRAVENOUS
  Filled 2022-12-09: qty 5

## 2022-12-09 MED ORDER — TECHNETIUM TC 99M MEBROFENIN IV KIT
5.0000 | PACK | Freq: Once | INTRAVENOUS | Status: AC | PRN
  Administered 2022-12-09: 5.5 via INTRAVENOUS

## 2023-01-02 NOTE — Progress Notes (Signed)
Sent message, via epic in basket, requesting orders in epic from surgeon.  

## 2023-01-05 ENCOUNTER — Ambulatory Visit: Payer: Self-pay | Admitting: General Surgery

## 2023-01-05 NOTE — Patient Instructions (Signed)
SURGICAL WAITING ROOM VISITATION Patients having surgery or a procedure may have no more than 2 support people in the waiting area - these visitors may rotate in the visitor waiting room.   Due to an increase in RSV and influenza rates and associated hospitalizations, children ages 77 and under may not visit patients in [place]. If the patient needs to stay at the hospital during part of their recovery, the visitor guidelines for inpatient rooms apply.  PRE-OP VISITATION  Pre-op nurse will coordinate an appropriate time for 1 support person to accompany the patient in pre-op.  This support person may not rotate.  This visitor will be contacted when the time is appropriate for the visitor to come back in the pre-op area.  Please refer to the [facility] website for the visitor guidelines for Inpatients (after your surgery is over and you are in a regular room).  You are not required to quarantine at this time prior to your surgery. However, you must do this: Hand Hygiene often Do NOT share personal items Notify your provider if you are in close contact with someone who has COVID or you develop fever 100.4 or greater, new onset of sneezing, cough, sore throat, shortness of breath or body aches.  If you test positive for Covid or have been in contact with anyone that has tested positive in the last 10 days please notify you surgeon.    Your procedure is scheduled on:  Thursday  January 15, 2023   Report to [facility] Main Entrance: [address] entrance where the [employer] is available.   Report to admitting at: 05:15    AM  +++++Call this number if you have any questions or problems the morning of surgery [phone]  Do not eat food after Midnight the night prior to your surgery/procedure.  After Midnight you may have the following liquids until  04:30  AM DAY OF SURGERY  Clear Liquid Diet Water Black Coffee (sugar ok, NO MILK/CREAM OR CREAMERS)  Tea (sugar ok, NO  MILK/CREAM OR CREAMERS) regular and decaf                             Plain Jell-O  with no fruit (NO RED)                                           Fruit ices (not with fruit pulp, NO RED)                                     Popsicles (NO RED)                                                                  Juice: apple, WHITE grape, WHITE cranberry Sports drinks like Gatorade or Powerade (NO RED)                   The day of surgery:  Drink ONE (1) Pre-Surgery Clear Ensure at 04:30    AM the morning of surgery. Drink  in one sitting. Do not sip.  This drink was given to you during your hospital pre-op appointment visit. Nothing else to drink after completing the Pre-Surgery Clear Ensure : No candy, chewing gum or throat lozenges.    FOLLOW BOWEL PREP AND ANY ADDITIONAL PRE OP INSTRUCTIONS YOU RECEIVED FROM YOUR SURGEON'S OFFICE!!!   Oral Hygiene is also important to reduce your risk of infection.        Remember - BRUSH YOUR TEETH THE MORNING OF SURGERY WITH YOUR REGULAR TOOTHPASTE  Do NOT smoke after Midnight the night before surgery.  Take ONLY these medicines the morning of surgery with A SIP OF WATER: None ???                    You may not have any metal on your body including jewelry, and body piercing  Do not wear lotions, powders, cologne, or deodorant  Men may shave face and neck.  Contacts, Hearing Aids, dentures or bridgework may not be worn into surgery. DENTURES WILL BE REMOVED PRIOR TO SURGERY PLEASE DO NOT APPLY "Poly grip" OR ADHESIVES!!!  Patients discharged on the day of surgery will not be allowed to drive home.  Someone NEEDS to stay with you for the first 24 hours after anesthesia.  Do not bring your home medications to the hospital. The Pharmacy will dispense medications listed on your medication list to you during your admission in the Hospital.  Special Instructions: Bring a copy of your healthcare power of attorney and living will documents the day of  surgery, if you wish to have them scanned into your [place] Medical Records- EPIC  Please read over the following fact sheets you were given: IF YOU HAVE QUESTIONS ABOUT YOUR PRE-OP INSTRUCTIONS, PLEASE CALL [phone]  ([place])   [place] - Preparing for Surgery Before surgery, you can play an important role.  Because skin is not sterile, your skin needs to be as free of germs as possible.  You can reduce the number of germs on your skin by washing with CHG (chlorahexidine gluconate) soap before surgery.  CHG is an antiseptic cleaner which kills germs and bonds with the skin to continue killing germs even after washing. Please DO NOT use if you have an allergy to CHG or antibacterial soaps.  If your skin becomes reddened/irritated stop using the CHG and inform your nurse when you arrive at Short Stay. Do not shave (including legs and underarms) for at least 48 hours prior to the first CHG shower.  You may shave your face/neck.  Please follow these instructions carefully:  1.  Shower with CHG Soap the night before surgery and the  morning of surgery.  2.  If you choose to wash your hair, wash your hair first as usual with your normal  shampoo.  3.  After you shampoo, rinse your hair and body thoroughly to remove the shampoo.                             4.  Use CHG as you would any other liquid soap.  You can apply chg directly to the skin and wash.  Gently with a scrungie or clean washcloth.  5.  Apply the CHG Soap to your body ONLY FROM THE NECK DOWN.   Do not use on face/ open  Wound or open sores. Avoid contact with eyes, ears mouth and genitals (private parts).                       Wash face,  Genitals (private parts) with your normal soap.             6.  Wash thoroughly, paying special attention to the area where your  surgery  will be performed.  7.  Thoroughly rinse your body with warm water from the neck down.  8.  DO NOT shower/wash with your normal soap  after using and rinsing off the CHG Soap.            9.  Pat yourself dry with a clean towel.            10.  Wear clean pajamas.            11.  Place clean sheets on your bed the night of your first shower and do not  sleep with pets.  ON THE DAY OF SURGERY : Do not apply any lotions/deodorants the morning of surgery.  Please wear clean clothes to the hospital/surgery center.    FAILURE TO FOLLOW THESE INSTRUCTIONS MAY RESULT IN THE CANCELLATION OF YOUR SURGERY  PATIENT SIGNATURE_________________________________  NURSE SIGNATURE__________________________________  ________________________________________________________________________

## 2023-01-05 NOTE — Progress Notes (Signed)
COVID Vaccine received:  '[x]'$  No '[]'$  Yes Date of any COVID positive Test in last 90 days:  None  PCP - Gar Ponto, MD Cardiologist - none  Chest x-ray - 07-08-2021  2v   epic EKG -  (07-08-2021  epic)  11-24-22 CE, done at [facility], EKG on chart Stress Test -  ECHO -  Cardiac Cath -   PCR screen: '[]'$  Ordered & Completed                      '[]'$   No Order but Needs PROFEND                      '[x]'$   N/A for this surgery  Surgery Plan:  '[x]'$  Ambulatory                            '[]'$  Outpatient in bed                            '[]'$  Admit  Anesthesia:    '[x]'$  General  '[]'$  Spinal                           '[]'$   Choice '[]'$   MAC  Bowel Prep - '[x]'$  No  '[]'$   Yes _____________  Pacemaker / ICD device '[x]'$  No '[]'$  Yes        Device order form faxed '[x]'$  No    '[]'$   Yes      Faxed to:  Spinal Cord Stimulator:'[x]'$  No '[]'$  Yes      (Remind patient to bring remote DOS) Other Implants:   History of Sleep Apnea? '[x]'$  No '[]'$  Yes   CPAP used?- '[x]'$  No '[]'$  Yes    Does the patient monitor blood sugar? '[]'$  No '[]'$  Yes  '[x]'$  N/A  Blood Thinner / Instructions:  none  Aspirin Instructions:  None  ERAS Protocol Ordered: '[]'$  No  '[x]'$  Yes PRE-SURGERY '[x]'$  ENSURE  '[]'$  G2  Patient is to be NPO after: 04:30 am  Activity level: Patient can climb a flight of stairs without difficulty; '[x]'$  No CP  '[x]'$  No SOB. Patient can perform ADLs without assistance.   Anesthesia review: GERD- No pertinent medical or surgical history.   Patient denies shortness of breath, fever, cough and chest pain at PAT appointment.  Patient verbalized understanding and agreement to the Pre-Surgical Instructions that were given to them at this PAT appointment. Patient was also educated of the need to review these PAT instructions again prior to his/her surgery.I reviewed the appropriate phone numbers to call if they have any and questions or concerns.

## 2023-01-05 NOTE — H&P (Signed)
Chief Complaint: New Consultation (Gallbladder)   History of Present Illness: David Boyd is a 37 y.o. male who is seen today as an office consultation at the request of Dr. Saunders Glance for evaluation of New Consultation (Gallbladder) .  Patient is a 37 year old male who comes in secondary to right upper quadrant pain he states this been going on for the last several months. He states that usually this is after eating. Patient has been to the ER secondary to pain that he states in the right upper quadrant. He states the last episode did radiate to his right back area.  He previously undergone CT scan and HIDA scan. HIDA scan was significant for an ejection fraction of 30%. Patient with a CT scan with normal gallbladder.  Patient had a previous umbilical hernia repair in the past.  Review of Systems: A complete review of systems was obtained from the patient. I have reviewed this information and discussed as appropriate with the patient. See HPI as well for other ROS.  Review of Systems Constitutional: Negative for fever. HENT: Negative for congestion. Eyes: Negative for blurred vision. Respiratory: Negative for cough, shortness of breath and wheezing. Cardiovascular: Negative for chest pain and palpitations. Gastrointestinal: Positive for abdominal pain and nausea. Negative for heartburn. Genitourinary: Negative for dysuria. Musculoskeletal: Negative for myalgias. Skin: Negative for rash. Neurological: Negative for dizziness and headaches. Psychiatric/Behavioral: Negative for depression and suicidal ideas. All other systems reviewed and are negative.   Medical History: Past Medical History: Diagnosis Date GERD (gastroesophageal reflux disease)  There is no problem list on file for this patient.  Past Surgical History: Procedure Laterality Date HERNIA REPAIR Pilonidaly cyst removal   No Known Allergies  Current Outpatient Medications on File Prior to Visit Medication  Sig Dispense Refill atorvastatin (LIPITOR) 10 MG tablet cholecalciferol (VITAMIN D3) 2,000 unit tablet Take 2,000 Units by mouth once daily CREATINE, BULK, MISC Use cyanocobalamin (VITAMIN B12) 1000 MCG tablet Take 1,000 mcg by mouth once daily multivit-minerals/FA/lycopene (ONE-A-DAY MEN'S ORAL) Take by mouth omega-3 fatty acids/fish oil (FISH OIL) 340-1,000 mg capsule Take 1 capsule by mouth 2 (two) times daily omeprazole (PRILOSEC) 20 MG DR capsule Take 20 mg by mouth once daily  No current facility-administered medications on file prior to visit.  Family History Problem Relation Age of Onset Hyperlipidemia (Elevated cholesterol) Mother Diabetes Mother Breast cancer Mother   Social History  Tobacco Use Smoking Status Never Smokeless Tobacco Never   Social History  Socioeconomic History Marital status: Married Tobacco Use Smoking status: Never Smokeless tobacco: Never Vaping Use Vaping Use: Never used Substance and Sexual Activity Alcohol use: Yes Drug use: Never  Objective:  Vitals: 12/22/22 1426 12/22/22 1432 BP: 132/80 Pulse: 68 Temp: 37.6 C (99.6 F) SpO2: 98% Weight: (!) 109.8 kg (242 lb) Height: 182.9 cm (6') PainSc: 0-No pain  Body mass index is 32.82 kg/m.  Physical Exam Constitutional: General: He is not in acute distress. Appearance: Normal appearance. HENT: Head: Normocephalic. Nose: No rhinorrhea. Mouth/Throat: Mouth: Mucous membranes are moist. Pharynx: Oropharynx is clear. Eyes: General: No scleral icterus. Pupils: Pupils are equal, round, and reactive to light. Cardiovascular: Rate and Rhythm: Normal rate. Pulses: Normal pulses. Pulmonary: Effort: Pulmonary effort is normal. No respiratory distress. Breath sounds: No stridor. No wheezing. Abdominal: General: Abdomen is flat. There is no distension. Tenderness: There is no abdominal tenderness. There is no guarding or rebound. Musculoskeletal: General: Normal range of  motion. Cervical back: Normal range of motion and neck supple. Skin: General: Skin is warm  and dry. Capillary Refill: Capillary refill takes less than 2 seconds. Coloration: Skin is not jaundiced. Neurological: General: No focal deficit present. Mental Status: He is alert and oriented to person, place, and time. Mental status is at baseline. Psychiatric: Mood and Affect: Mood normal. Thought Content: Thought content normal. Judgment: Judgment normal.    Assessment and Plan: Diagnoses and all orders for this visit:  Biliary dyskinesia   David Boyd is a 37 y.o. male  We will proceed to the OR for a lap cholecystectomy. All risks and benefits were discussed with the patient to generally include: infection, bleeding, possible need for post op ERCP, damage to the bile ducts, and bile leak. Alternatives were offered and described. All questions were answered and the patient voiced understanding of the procedure and wishes to proceed at this point with a laparoscopic cholecystectomy  No follow-ups on file.  Ralene Ok, MD, [facility], [place] [facility]

## 2023-01-05 NOTE — H&P (View-Only) (Signed)
Chief Complaint: New Consultation (Gallbladder)   History of Present Illness: David Boyd is a 37 y.o. male who is seen today as an office consultation at the request of Dr. Saunders Glance for evaluation of New Consultation (Gallbladder) .  Patient is a 37 year old male who comes in secondary to right upper quadrant pain he states this been going on for the last several months. He states that usually this is after eating. Patient has been to the ER secondary to pain that he states in the right upper quadrant. He states the last episode did radiate to his right back area.  He previously undergone CT scan and HIDA scan. HIDA scan was significant for an ejection fraction of 30%. Patient with a CT scan with normal gallbladder.  Patient had a previous umbilical hernia repair in the past.  Review of Systems: A complete review of systems was obtained from the patient. I have reviewed this information and discussed as appropriate with the patient. See HPI as well for other ROS.  Review of Systems Constitutional: Negative for fever. HENT: Negative for congestion. Eyes: Negative for blurred vision. Respiratory: Negative for cough, shortness of breath and wheezing. Cardiovascular: Negative for chest pain and palpitations. Gastrointestinal: Positive for abdominal pain and nausea. Negative for heartburn. Genitourinary: Negative for dysuria. Musculoskeletal: Negative for myalgias. Skin: Negative for rash. Neurological: Negative for dizziness and headaches. Psychiatric/Behavioral: Negative for depression and suicidal ideas. All other systems reviewed and are negative.   Medical History: Past Medical History: Diagnosis Date GERD (gastroesophageal reflux disease)  There is no problem list on file for this patient.  Past Surgical History: Procedure Laterality Date HERNIA REPAIR Pilonidaly cyst removal   No Known Allergies  Current Outpatient Medications on File Prior to Visit Medication  Sig Dispense Refill atorvastatin (LIPITOR) 10 MG tablet cholecalciferol (VITAMIN D3) 2,000 unit tablet Take 2,000 Units by mouth once daily CREATINE, BULK, MISC Use cyanocobalamin (VITAMIN B12) 1000 MCG tablet Take 1,000 mcg by mouth once daily multivit-minerals/FA/lycopene (ONE-A-DAY MEN'S ORAL) Take by mouth omega-3 fatty acids/fish oil (FISH OIL) 340-1,000 mg capsule Take 1 capsule by mouth 2 (two) times daily omeprazole (PRILOSEC) 20 MG DR capsule Take 20 mg by mouth once daily  No current facility-administered medications on file prior to visit.  Family History Problem Relation Age of Onset Hyperlipidemia (Elevated cholesterol) Mother Diabetes Mother Breast cancer Mother   Social History  Tobacco Use Smoking Status Never Smokeless Tobacco Never   Social History  Socioeconomic History Marital status: Married Tobacco Use Smoking status: Never Smokeless tobacco: Never Vaping Use Vaping Use: Never used Substance and Sexual Activity Alcohol use: Yes Drug use: Never  Objective:  Vitals: 12/22/22 1426 12/22/22 1432 BP: 132/80 Pulse: 68 Temp: 37.6 C (99.6 F) SpO2: 98% Weight: (!) 109.8 kg (242 lb) Height: 182.9 cm (6') PainSc: 0-No pain  Body mass index is 32.82 kg/m.  Physical Exam Constitutional: General: He is not in acute distress. Appearance: Normal appearance. HENT: Head: Normocephalic. Nose: No rhinorrhea. Mouth/Throat: Mouth: Mucous membranes are moist. Pharynx: Oropharynx is clear. Eyes: General: No scleral icterus. Pupils: Pupils are equal, round, and reactive to light. Cardiovascular: Rate and Rhythm: Normal rate. Pulses: Normal pulses. Pulmonary: Effort: Pulmonary effort is normal. No respiratory distress. Breath sounds: No stridor. No wheezing. Abdominal: General: Abdomen is flat. There is no distension. Tenderness: There is no abdominal tenderness. There is no guarding or rebound. Musculoskeletal: General: Normal range of  motion. Cervical back: Normal range of motion and neck supple. Skin: General: Skin is warm  and dry. Capillary Refill: Capillary refill takes less than 2 seconds. Coloration: Skin is not jaundiced. Neurological: General: No focal deficit present. Mental Status: He is alert and oriented to person, place, and time. Mental status is at baseline. Psychiatric: Mood and Affect: Mood normal. Thought Content: Thought content normal. Judgment: Judgment normal.    Assessment and Plan: Diagnoses and all orders for this visit:  Biliary dyskinesia   David Boyd is a 37 y.o. male  We will proceed to the OR for a lap cholecystectomy. All risks and benefits were discussed with the patient to generally include: infection, bleeding, possible need for post op ERCP, damage to the bile ducts, and bile leak. Alternatives were offered and described. All questions were answered and the patient voiced understanding of the procedure and wishes to proceed at this point with a laparoscopic cholecystectomy  No follow-ups on file.  Ralene Ok, MD, [facility], [facility]

## 2023-01-05 NOTE — Progress Notes (Signed)
Sent message, via epic in basket, requesting orders in epic from surgeon.  

## 2023-01-08 ENCOUNTER — Encounter (HOSPITAL_COMMUNITY): Payer: Self-pay

## 2023-01-08 ENCOUNTER — Encounter: Payer: Self-pay | Admitting: Radiology

## 2023-01-08 ENCOUNTER — Encounter (HOSPITAL_COMMUNITY)
Admission: RE | Admit: 2023-01-08 | Discharge: 2023-01-08 | Disposition: A | Discharge status: Home / Self Care | Payer: Commercial Managed Care - PPO | Source: Ambulatory Visit | Attending: General Surgery | Admitting: General Surgery

## 2023-01-08 ENCOUNTER — Other Ambulatory Visit: Payer: Self-pay

## 2023-01-08 VITALS — BP 144/80 | HR 56 | Temp 98.3°F | Resp 14 | Ht 71.0 in | Wt 235.0 lb

## 2023-01-08 DIAGNOSIS — Z01818 Encounter for other preprocedural examination: Secondary | ICD-10-CM | POA: Diagnosis present

## 2023-01-08 LAB — CBC
HCT: 47.6 % (ref 39.0–52.0)
Hemoglobin: 16.3 g/dL (ref 13.0–17.0)
MCH: 30.8 pg (ref 26.0–34.0)
MCHC: 34.2 g/dL (ref 30.0–36.0)
MCV: 89.8 fL (ref 80.0–100.0)
Platelets: 210 10*3/uL (ref 150–400)
RBC: 5.3 MIL/uL (ref 4.22–5.81)
RDW: 12 % (ref 11.5–15.5)
WBC: 5.2 10*3/uL (ref 4.0–10.5)
nRBC: 0 % (ref 0.0–0.2)

## 2023-01-09 ENCOUNTER — Encounter (HOSPITAL_COMMUNITY)
Admission: RE | Admit: 2023-01-09 | Discharge: 2023-01-09 | Disposition: A | Discharge status: Home / Self Care | Payer: Commercial Managed Care - PPO | Source: Ambulatory Visit | Attending: Family Medicine | Admitting: Family Medicine

## 2023-01-14 ENCOUNTER — Encounter (HOSPITAL_COMMUNITY): Payer: Self-pay | Admitting: General Surgery

## 2023-01-14 NOTE — Anesthesia Preprocedure Evaluation (Signed)
Anesthesia Evaluation  Patient identified by MRN, date of birth, ID band Patient awake    Reviewed: Allergy & Precautions, NPO status , Patient's Chart, lab work & pertinent test results  Airway Mallampati: II       Dental no notable dental hx. (+) Teeth Intact, Dental Advisory Given   Pulmonary neg pulmonary ROS   Pulmonary exam normal breath sounds clear to auscultation       Cardiovascular negative cardio ROS Normal cardiovascular exam Rhythm:Regular Rate:Normal     Neuro/Psych negative neurological ROS  negative psych ROS   GI/Hepatic Neg liver ROS,GERD  ,,Biliary dyskinesia   Endo/Other  Hyperlipidemia Obesity  Renal/GU negative Renal ROS  negative genitourinary   Musculoskeletal   Abdominal  (+) + obese  Peds  Hematology negative hematology ROS (+)   Anesthesia Other Findings   Reproductive/Obstetrics                              Anesthesia Physical Anesthesia Plan  ASA: 2  Anesthesia Plan: General   Post-op Pain Management: Tylenol PO (pre-op)*, Precedex, Dilaudid IV and Ketamine IV*   Induction: Intravenous and Cricoid pressure planned  PONV Risk Score and Plan: 4 or greater and Treatment may vary due to age or medical condition, Midazolam, Ondansetron and Dexamethasone  Airway Management Planned: Oral ETT  Additional Equipment: None  Intra-op Plan:   Post-operative Plan: Extubation in OR  Informed Consent: I have reviewed the patients History and Physical, chart, labs and discussed the procedure including the risks, benefits and alternatives for the proposed anesthesia with the patient or authorized representative who has indicated his/her understanding and acceptance.     Dental advisory given  Plan Discussed with: Anesthesiologist and CRNA  Anesthesia Plan Comments:          Anesthesia Quick Evaluation

## 2023-01-15 ENCOUNTER — Other Ambulatory Visit: Payer: Self-pay

## 2023-01-15 ENCOUNTER — Ambulatory Visit (HOSPITAL_COMMUNITY): Payer: Commercial Managed Care - PPO | Admitting: Anesthesiology

## 2023-01-15 ENCOUNTER — Ambulatory Visit (HOSPITAL_BASED_OUTPATIENT_CLINIC_OR_DEPARTMENT_OTHER): Payer: Commercial Managed Care - PPO | Admitting: Anesthesiology

## 2023-01-15 ENCOUNTER — Ambulatory Visit (HOSPITAL_COMMUNITY)
Admission: RE | Admit: 2023-01-15 | Discharge: 2023-01-15 | Disposition: A | Discharge status: Home / Self Care | Payer: Commercial Managed Care - PPO | Source: Ambulatory Visit | Attending: General Surgery | Admitting: General Surgery

## 2023-01-15 ENCOUNTER — Encounter (HOSPITAL_COMMUNITY): Payer: Self-pay | Admitting: General Surgery

## 2023-01-15 ENCOUNTER — Encounter (HOSPITAL_COMMUNITY)
Admission: RE | Disposition: A | Discharge status: Home / Self Care | Payer: Self-pay | Source: Ambulatory Visit | Attending: General Surgery

## 2023-01-15 DIAGNOSIS — Z01818 Encounter for other preprocedural examination: Secondary | ICD-10-CM

## 2023-01-15 DIAGNOSIS — K828 Other specified diseases of gallbladder: Secondary | ICD-10-CM | POA: Diagnosis not present

## 2023-01-15 DIAGNOSIS — E785 Hyperlipidemia, unspecified: Secondary | ICD-10-CM | POA: Diagnosis not present

## 2023-01-15 DIAGNOSIS — Z6832 Body mass index (BMI) 32.0-32.9, adult: Secondary | ICD-10-CM | POA: Diagnosis not present

## 2023-01-15 DIAGNOSIS — E669 Obesity, unspecified: Secondary | ICD-10-CM | POA: Diagnosis not present

## 2023-01-15 DIAGNOSIS — K8010 Calculus of gallbladder with chronic cholecystitis without obstruction: Secondary | ICD-10-CM | POA: Diagnosis not present

## 2023-01-15 DIAGNOSIS — K219 Gastro-esophageal reflux disease without esophagitis: Secondary | ICD-10-CM | POA: Diagnosis not present

## 2023-01-15 DIAGNOSIS — K8020 Calculus of gallbladder without cholecystitis without obstruction: Secondary | ICD-10-CM | POA: Diagnosis not present

## 2023-01-15 HISTORY — PX: CHOLECYSTECTOMY: SHX55

## 2023-01-15 SURGERY — LAPAROSCOPIC CHOLECYSTECTOMY
Anesthesia: General

## 2023-01-15 MED ORDER — OXYCODONE HCL 5 MG/5ML PO SOLN: 5.0000 mg | Freq: Once | ORAL | Status: AC | PRN

## 2023-01-15 MED ORDER — LACTATED RINGERS IR SOLN
Status: DC | PRN
  Administered 2023-01-15: 1000 mL

## 2023-01-15 MED ORDER — KETOROLAC TROMETHAMINE 30 MG/ML IJ SOLN
INTRAMUSCULAR | Status: AC
  Filled 2023-01-15: qty 1

## 2023-01-15 MED ORDER — DEXAMETHASONE SODIUM PHOSPHATE 10 MG/ML IJ SOLN
INTRAMUSCULAR | Status: AC
  Filled 2023-01-15: qty 1

## 2023-01-15 MED ORDER — TRAMADOL HCL 50 MG PO TABS: 50.0000 mg | ORAL_TABLET | Freq: Four times a day (QID) | ORAL | 0 refills | Status: DC | PRN

## 2023-01-15 MED ORDER — ONDANSETRON HCL 4 MG/2ML IJ SOLN: 4.0000 mg | Freq: Once | INTRAMUSCULAR | Status: DC | PRN

## 2023-01-15 MED ORDER — ONDANSETRON HCL 4 MG/2ML IJ SOLN
INTRAMUSCULAR | Status: DC | PRN
  Administered 2023-01-15: 4 mg via INTRAVENOUS

## 2023-01-15 MED ORDER — EPHEDRINE 5 MG/ML INJ
INTRAVENOUS | Status: AC
  Filled 2023-01-15: qty 5

## 2023-01-15 MED ORDER — CHLORHEXIDINE GLUCONATE 0.12 % MT SOLN
15.0000 mL | Freq: Once | OROMUCOSAL | Status: AC
  Administered 2023-01-15: 15 mL via OROMUCOSAL

## 2023-01-15 MED ORDER — PROPOFOL 10 MG/ML IV BOLUS
INTRAVENOUS | Status: AC
  Filled 2023-01-15: qty 20

## 2023-01-15 MED ORDER — ENSURE PRE-SURGERY PO LIQD
296.0000 mL | Freq: Once | ORAL | Status: DC
  Filled 2023-01-15: qty 296

## 2023-01-15 MED ORDER — OXYCODONE HCL 5 MG PO TABS
5.0000 mg | ORAL_TABLET | Freq: Once | ORAL | Status: AC | PRN
  Administered 2023-01-15: 5 mg via ORAL

## 2023-01-15 MED ORDER — MIDAZOLAM HCL 5 MG/5ML IJ SOLN
INTRAMUSCULAR | Status: DC | PRN
  Administered 2023-01-15: 2 mg via INTRAVENOUS

## 2023-01-15 MED ORDER — HYDROMORPHONE HCL 1 MG/ML IJ SOLN: 0.2500 mg | INTRAMUSCULAR | Status: DC | PRN

## 2023-01-15 MED ORDER — PROPOFOL 10 MG/ML IV BOLUS
INTRAVENOUS | Status: DC | PRN
  Administered 2023-01-15: 200 mg via INTRAVENOUS

## 2023-01-15 MED ORDER — 0.9 % SODIUM CHLORIDE (POUR BTL) OPTIME
TOPICAL | Status: DC | PRN
  Administered 2023-01-15: 1000 mL

## 2023-01-15 MED ORDER — FENTANYL CITRATE (PF) 100 MCG/2ML IJ SOLN
INTRAMUSCULAR | Status: DC | PRN
  Administered 2023-01-15: 100 ug via INTRAVENOUS

## 2023-01-15 MED ORDER — OXYCODONE HCL 5 MG PO TABS
ORAL_TABLET | ORAL | Status: AC
  Filled 2023-01-15: qty 1

## 2023-01-15 MED ORDER — ONDANSETRON HCL 4 MG/2ML IJ SOLN
INTRAMUSCULAR | Status: AC
  Filled 2023-01-15: qty 2

## 2023-01-15 MED ORDER — LIDOCAINE HCL (CARDIAC) PF 100 MG/5ML IV SOSY
PREFILLED_SYRINGE | INTRAVENOUS | Status: DC | PRN
  Administered 2023-01-15: 100 mg via INTRAVENOUS

## 2023-01-15 MED ORDER — SCOPOLAMINE 1 MG/3DAYS TD PT72: 1.0000 | MEDICATED_PATCH | TRANSDERMAL | Status: DC

## 2023-01-15 MED ORDER — KETOROLAC TROMETHAMINE 30 MG/ML IJ SOLN
INTRAMUSCULAR | Status: DC | PRN
  Administered 2023-01-15: 30 mg via INTRAVENOUS

## 2023-01-15 MED ORDER — CEFAZOLIN SODIUM-DEXTROSE 2-4 GM/100ML-% IV SOLN
2.0000 g | INTRAVENOUS | Status: AC
  Administered 2023-01-15: 2 g via INTRAVENOUS
  Filled 2023-01-15: qty 100

## 2023-01-15 MED ORDER — CHLORHEXIDINE GLUCONATE CLOTH 2 % EX PADS: 6.0000 | MEDICATED_PAD | Freq: Once | CUTANEOUS | Status: DC

## 2023-01-15 MED ORDER — ORAL CARE MOUTH RINSE: 15.0000 mL | Freq: Once | OROMUCOSAL | Status: AC

## 2023-01-15 MED ORDER — ROCURONIUM BROMIDE 100 MG/10ML IV SOLN
INTRAVENOUS | Status: DC | PRN
  Administered 2023-01-15: 70 mg via INTRAVENOUS

## 2023-01-15 MED ORDER — ACETAMINOPHEN 500 MG PO TABS
1000.0000 mg | ORAL_TABLET | ORAL | Status: AC
  Administered 2023-01-15: 1000 mg via ORAL
  Filled 2023-01-15: qty 2

## 2023-01-15 MED ORDER — EPHEDRINE SULFATE (PRESSORS) 50 MG/ML IJ SOLN
INTRAMUSCULAR | Status: DC | PRN
  Administered 2023-01-15: 10 mg via INTRAVENOUS

## 2023-01-15 MED ORDER — DROPERIDOL 2.5 MG/ML IJ SOLN: 0.6250 mg | Freq: Once | INTRAMUSCULAR | Status: DC | PRN

## 2023-01-15 MED ORDER — MIDAZOLAM HCL 2 MG/2ML IJ SOLN
INTRAMUSCULAR | Status: AC
  Filled 2023-01-15: qty 2

## 2023-01-15 MED ORDER — SUGAMMADEX SODIUM 500 MG/5ML IV SOLN
INTRAVENOUS | Status: AC
  Filled 2023-01-15: qty 5

## 2023-01-15 MED ORDER — DEXMEDETOMIDINE HCL IN NACL 80 MCG/20ML IV SOLN
INTRAVENOUS | Status: DC | PRN
  Administered 2023-01-15: 8 ug via BUCCAL

## 2023-01-15 MED ORDER — DEXAMETHASONE SODIUM PHOSPHATE 10 MG/ML IJ SOLN
INTRAMUSCULAR | Status: DC | PRN
  Administered 2023-01-15: 10 mg via INTRAVENOUS

## 2023-01-15 MED ORDER — FENTANYL CITRATE (PF) 250 MCG/5ML IJ SOLN
INTRAMUSCULAR | Status: AC
  Filled 2023-01-15: qty 5

## 2023-01-15 MED ORDER — SUGAMMADEX SODIUM 500 MG/5ML IV SOLN
INTRAVENOUS | Status: DC | PRN
  Administered 2023-01-15: 400 mg via INTRAVENOUS

## 2023-01-15 MED ORDER — BUPIVACAINE-EPINEPHRINE (PF) 0.25% -1:200000 IJ SOLN
INTRAMUSCULAR | Status: DC | PRN
  Administered 2023-01-15: 15 mL via PERINEURAL

## 2023-01-15 MED ORDER — BUPIVACAINE-EPINEPHRINE (PF) 0.25% -1:200000 IJ SOLN
INTRAMUSCULAR | Status: AC
  Filled 2023-01-15: qty 30

## 2023-01-15 MED ORDER — LACTATED RINGERS IV SOLN: INTRAVENOUS | Status: DC

## 2023-01-15 SURGICAL SUPPLY — 40 items
ADH SKN CLS APL DERMABOND .7 (GAUZE/BANDAGES/DRESSINGS) ×1
APL PRP STRL LF DISP 70% ISPRP (MISCELLANEOUS) ×1
APPLIER CLIP 5 13 M/L LIGAMAX5 (MISCELLANEOUS)
APR CLP MED LRG 5 ANG JAW (MISCELLANEOUS)
BAG COUNTER SPONGE SURGICOUNT (BAG) IMPLANT
BAG SPEC RTRVL 10 TROC 200 (ENDOMECHANICALS)
BAG SPNG CNTER NS LX DISP (BAG)
CABLE HIGH FREQUENCY MONO STRZ (ELECTRODE) ×1 IMPLANT
CHLORAPREP W/TINT 26 (MISCELLANEOUS) ×1 IMPLANT
CLIP APPLIE 5 13 M/L LIGAMAX5 (MISCELLANEOUS) IMPLANT
CLIP LIGATING HEMO O LOK GREEN (MISCELLANEOUS) ×1 IMPLANT
COVER MAYO STAND XLG (MISCELLANEOUS) IMPLANT
COVER TRANSDUCER ULTRASND (DRAPES) IMPLANT
DERMABOND ADVANCED .7 DNX12 (GAUZE/BANDAGES/DRESSINGS) ×1 IMPLANT
DRAPE C-ARM 42X120 X-RAY (DRAPES) IMPLANT
ELECT REM PT RETURN 15FT ADLT (MISCELLANEOUS) ×1 IMPLANT
GAUZE SPONGE 2X2 STRL 8-PLY (GAUZE/BANDAGES/DRESSINGS) ×1 IMPLANT
GLOVE BIO SURGEON STRL SZ7.5 (GLOVE) ×1 IMPLANT
GOWN STRL REUS W/ TWL XL LVL3 (GOWN DISPOSABLE) ×1 IMPLANT
GOWN STRL REUS W/TWL XL LVL3 (GOWN DISPOSABLE) ×1
GRASPER SUT TROCAR 14GX15 (MISCELLANEOUS) IMPLANT
IRRIG SUCT STRYKERFLOW 2 WTIP (MISCELLANEOUS) ×1
IRRIGATION SUCT STRKRFLW 2 WTP (MISCELLANEOUS) ×1 IMPLANT
KIT BASIN OR (CUSTOM PROCEDURE TRAY) ×1 IMPLANT
KIT TURNOVER KIT A (KITS) IMPLANT
NDL INSUFFLATION 14GA 120MM (NEEDLE) ×1 IMPLANT
NEEDLE INSUFFLATION 14GA 120MM (NEEDLE) ×1 IMPLANT
PENCIL SMOKE EVACUATOR (MISCELLANEOUS) IMPLANT
POUCH RETRIEVAL ECOSAC 10 (ENDOMECHANICALS) IMPLANT
POUCH RETRIEVAL ECOSAC 10MM (ENDOMECHANICALS)
SCISSORS LAP 5X35 DISP (ENDOMECHANICALS) ×1 IMPLANT
SET CHOLANGIOGRAPH MIX (MISCELLANEOUS) IMPLANT
SET TUBE SMOKE EVAC HIGH FLOW (TUBING) ×1 IMPLANT
SLEEVE Z-THREAD 5X100MM (TROCAR) ×1 IMPLANT
SPIKE FLUID TRANSFER (MISCELLANEOUS) ×1 IMPLANT
SUT MNCRL AB 4-0 PS2 18 (SUTURE) ×1 IMPLANT
TOWEL OR 17X26 10 PK STRL BLUE (TOWEL DISPOSABLE) ×1 IMPLANT
TRAY LAPAROSCOPIC (CUSTOM PROCEDURE TRAY) ×1 IMPLANT
TROCAR 11X100 Z THREAD (TROCAR) ×1 IMPLANT
TROCAR Z-THREAD OPTICAL 5X100M (TROCAR) ×1 IMPLANT

## 2023-01-15 NOTE — Discharge Instructions (Signed)
[facility] ______[facility] LAPAROSCOPIC SURGERY: POST OP INSTRUCTIONS Always review your discharge instruction sheet given to you by the facility where your surgery was performed. IF YOU HAVE DISABILITY OR FAMILY LEAVE FORMS, YOU MUST BRING THEM TO THE OFFICE FOR PROCESSING.   DO NOT GIVE THEM TO YOUR DOCTOR.  A prescription for pain medication may be given to you upon discharge.  Take your pain medication as prescribed, if needed.  If narcotic pain medicine is not needed, then you may take acetaminophen (Tylenol) or ibuprofen (Advil) as needed. Take your usually prescribed medications unless otherwise directed. If you need a refill on your pain medication, please contact your pharmacy.  They will contact our office to request authorization. Prescriptions will not be filled after 5pm or on week-ends. You should follow a light diet the first few days after arrival home, such as soup and crackers, etc.  Be sure to include lots of fluids daily. Most patients will experience some swelling and bruising in the area of the incisions.  Ice packs will help.  Swelling and bruising can take several days to resolve.  It is common to experience some constipation if taking pain medication after surgery.  Increasing fluid intake and taking a stool softener (such as Colace) will usually help or prevent this problem from occurring.  A mild laxative (Milk of Magnesia or Miralax) should be taken according to package instructions if there are no bowel movements after 48 hours. Unless discharge instructions indicate otherwise, you may remove your bandages 24-48 hours after surgery, and you may shower at that time.  You may have steri-strips (small skin tapes) in place directly over the incision.  These strips should be left on the skin for 7-10 days.  If your surgeon used skin glue on the incision, you may shower in 24 hours.  The glue will flake off over the next 2-3 weeks.  Any sutures or staples will be  removed at the office during your follow-up visit. ACTIVITIES:  You may resume regular (light) daily activities beginning the next day--such as daily self-care, walking, climbing stairs--gradually increasing activities as tolerated.  You may have sexual intercourse when it is comfortable.  Refrain from any heavy lifting or straining until approved by your doctor. You may drive when you are no longer taking prescription pain medication, you can comfortably wear a seatbelt, and you can safely maneuver your car and apply brakes. RETURN TO WORK:  __________________________________________________________ You should see your doctor in the office for a follow-up appointment approximately 2-3 weeks after your surgery.  Make sure that you call for this appointment within a day or two after you arrive home to insure a convenient appointment time. OTHER INSTRUCTIONS: __________________________________________________________________________________________________________________________ __________________________________________________________________________________________________________________________ WHEN TO CALL YOUR DOCTOR: Fever over 101.0 Inability to urinate Continued bleeding from incision. Increased pain, redness, or drainage from the incision. Increasing abdominal pain  The clinic staff is available to answer your questions during regular business hours.  Please don't hesitate to call and ask to speak to one of the nurses for clinical concerns.  If you have a medical emergency, go to the nearest emergency room or call 911.  A surgeon from [facility] is always on call at the hospital. [address], [address], [place], [place]  [address] ? [address] [address], [place], [place]   [address] [phone] ? [phone] ? FAX [phone] Web site: [email or web address]  

## 2023-01-15 NOTE — Anesthesia Procedure Notes (Signed)
Procedure Name: Intubation Date/Time: 01/15/2023 7:23 AM  Performed by: Jonna Munro, CRNAPre-anesthesia Checklist: Patient identified, Emergency Drugs available, Suction available, Patient being monitored and Timeout performed Patient Re-evaluated:Patient Re-evaluated prior to induction Oxygen Delivery Method: Circle system utilized Preoxygenation: Pre-oxygenation with 100% oxygen Induction Type: IV induction, Rapid sequence and Cricoid Pressure applied Laryngoscope Size: Mac and 4 Grade View: Grade I Tube type: Oral Tube size: 7.5 mm Number of attempts: 1 Airway Equipment and Method: Stylet Placement Confirmation: ETT inserted through vocal cords under direct vision, positive ETCO2, CO2 detector and breath sounds checked- equal and bilateral Secured at: 23 cm Tube secured with: Tape Dental Injury: Teeth and Oropharynx as per pre-operative assessment

## 2023-01-15 NOTE — Op Note (Signed)
01/15/2023  8:03 AM  PATIENT:  David Boyd  37 y.o. male  PRE-OPERATIVE DIAGNOSIS:  BILIARY DYSKINESIA  POST-OPERATIVE DIAGNOSIS: Cholelithiasis, BILIARY DYSKINESIA  PROCEDURE:  Procedure(s): LAPAROSCOPIC CHOLECYSTECTOMY (N/A)  SURGEON:  Surgeon(s) and Role:    * Ralene Ok, MD - Primary  ANESTHESIA:   local and general  EBL:  minimal   BLOOD ADMINISTERED:none  DRAINS: none   LOCAL MEDICATIONS USED:  BUPIVICAINE   SPECIMEN:  Source of Specimen:  gallbladder  DISPOSITION OF SPECIMEN:  PATHOLOGY  COUNTS:  YES  TOURNIQUET:  * No tourniquets in log *  DICTATION: .Dragon Dictation   EBL: [id number]   Complications: none   Counts: reported as correct x 2   Findings:gallstones  Indications for procedure: Pt is a 45M with RUQ pain and seen to have gallstones.   Details of the procedure: The patient was taken to the operating and placed in the supine position with bilateral SCDs in place. A time out was called and all facts were verified. A pneumoperitoneum was obtained via A Veress needle technique to a pressure of 3m of mercury. A 580mtrochar was then placed in the right upper quadrant under visualization, and there were no injuries to any abdominal organs. A 11 mm port was then placed in the umbilical region after infiltrating with local anesthesia under direct visualization. A second epigastric port was placed under direct visualization.   The gallbladder was identified and retracted, the peritoneum was then sharply dissected from the gallbladder and this dissection was carried down to Calot's triangle. The cystic duct was identified and dissected circumferentially and seen going into the gallbladder 360.  The cystic artery was dissected away from the surrounding tissues.   The critical angle was obtained.   2 clips were placed proximally one distally and the cystic duct transected. The cystic artery was identified and 2 clips placed proximally and one distally and  transected. We then proceeded to remove the gallbladder off the hepatic fossa with Bovie cautery. A retrieval bag was then placed in the abdomen and gallbladder placed in the bag. The hepatic fossa was then reexamined and hemostasis was achieved with Bovie cautery and was excellent at this portion of the case. The subhepatic fossa and perihepatic fossa was then irrigated until the effluent was clear. The specimen bag and specimen were removed from the abdominal cavity.  The 11 mm trocar fascia was reapproximated with the Endo Close #1 Vicryl x2. The pneumoperitoneum was evacuated and all trochars removed under direct visulalization. The skin was then closed with 4-0 Monocryl and the skin dressed with Dermabond. The patient was awaken from general anesthesia and taken to the recovery room in stable condition.    PLAN OF CARE: Discharge to home after PACU  PATIENT DISPOSITION:  PACU - hemodynamically stable.   Delay start of Pharmacological VTE agent (>24hrs) due to surgical blood loss or risk of bleeding: not applicable

## 2023-01-15 NOTE — Interval H&P Note (Signed)
History and Physical Interval Note:  01/15/2023 7:05 AM  David Boyd  has presented today for surgery, with the diagnosis of BILIARY DYSKINESIA.  The various methods of treatment have been discussed with the patient and family. After consideration of risks, benefits and other options for treatment, the patient has consented to  Procedure(s): LAPAROSCOPIC CHOLECYSTECTOMY (N/A) as a surgical intervention.  The patient's history has been reviewed, patient examined, no change in status, stable for surgery.  I have reviewed the patient's chart and labs.  Questions were answered to the patient's satisfaction.     Ralene Ok

## 2023-01-15 NOTE — Anesthesia Postprocedure Evaluation (Signed)
Anesthesia Post Note  Patient: David Boyd  Procedure(s) Performed: LAPAROSCOPIC CHOLECYSTECTOMY     Patient location during evaluation: PACU Anesthesia Type: General Level of consciousness: awake and alert and oriented Pain management: pain level controlled Vital Signs Assessment: post-procedure vital signs reviewed and stable Respiratory status: spontaneous breathing, nonlabored ventilation and respiratory function stable Cardiovascular status: blood pressure returned to baseline and stable Postop Assessment: no apparent nausea or vomiting Anesthetic complications: no   No notable events documented.  Last Vitals:  Vitals:   01/15/23 0830 01/15/23 0845  BP: 134/64 (!) 141/68  Pulse: 60 60  Resp: 15 12  Temp:    SpO2: 98% 99%    Last Pain:  Vitals:   01/15/23 0830  TempSrc:   PainSc: 0-No pain                 [name]

## 2023-01-15 NOTE — Transfer of Care (Signed)
Immediate Anesthesia Transfer of Care Note  Patient: David Boyd  Procedure(s) Performed: LAPAROSCOPIC CHOLECYSTECTOMY  Patient Location: PACU  Anesthesia Type:General  Level of Consciousness: awake, alert , and patient cooperative  Airway & Oxygen Therapy: Patient Spontanous Breathing and Patient connected to face mask oxygen  Post-op Assessment: Report given to RN, Post -op Vital signs reviewed and stable, and Patient moving all extremities X 4  Post vital signs: Reviewed and stable  Last Vitals:  Vitals Value Taken Time  BP 156/90   Temp    Pulse 81 01/15/23 0815  Resp 16 01/15/23 0815  SpO2 100 % 01/15/23 0815  Vitals shown include unvalidated device data.  Last Pain:  Vitals:   01/15/23 0627  TempSrc:   PainSc: 0-No pain         Complications: No notable events documented.

## 2023-01-16 ENCOUNTER — Encounter (HOSPITAL_COMMUNITY): Payer: Self-pay | Admitting: General Surgery

## 2023-01-16 LAB — SURGICAL PATHOLOGY

## 2023-01-22 NOTE — H&P (Signed)
Chief Complaint: New Consultation (Gallbladder)   History of Present Illness: David Boyd is a 37 y.o. male who is seen today as an office consultation at the request of Dr. Skillman for evaluation of New Consultation (Gallbladder) .  Patient is a 37-year-old male who comes in secondary to right upper quadrant pain he states this been going on for the last several months. He states that usually this is after eating. Patient has been to the ER secondary to pain that he states in the right upper quadrant. He states the last episode did radiate to his right back area.  He previously undergone CT scan and HIDA scan. HIDA scan was significant for an ejection fraction of 30%. Patient with a CT scan with normal gallbladder.  Patient had a previous umbilical hernia repair in the past.  Review of Systems: A complete review of systems was obtained from the patient. I have reviewed this information and discussed as appropriate with the patient. See HPI as well for other ROS.  Review of Systems Constitutional: Negative for fever. HENT: Negative for congestion. Eyes: Negative for blurred vision. Respiratory: Negative for cough, shortness of breath and wheezing. Cardiovascular: Negative for chest pain and palpitations. Gastrointestinal: Positive for abdominal pain and nausea. Negative for heartburn. Genitourinary: Negative for dysuria. Musculoskeletal: Negative for myalgias. Skin: Negative for rash. Neurological: Negative for dizziness and headaches. Psychiatric/Behavioral: Negative for depression and suicidal ideas. All other systems reviewed and are negative.   Medical History: Past Medical History: Diagnosis Date GERD (gastroesophageal reflux disease)  There is no problem list on file for this patient.  Past Surgical History: Procedure Laterality Date HERNIA REPAIR Pilonidaly cyst removal   No Known Allergies  Current Outpatient Medications on File Prior to Visit Medication  Sig Dispense Refill atorvastatin (LIPITOR) 10 MG tablet cholecalciferol (VITAMIN D3) 2,000 unit tablet Take 2,000 Units by mouth once daily CREATINE, BULK, MISC Use cyanocobalamin (VITAMIN B12) 1000 MCG tablet Take 1,000 mcg by mouth once daily multivit-minerals/FA/lycopene (ONE-A-DAY MEN'S ORAL) Take by mouth omega-3 fatty acids/fish oil (FISH OIL) 340-1,000 mg capsule Take 1 capsule by mouth 2 (two) times daily omeprazole (PRILOSEC) 20 MG DR capsule Take 20 mg by mouth once daily  No current facility-administered medications on file prior to visit.  Family History Problem Relation Age of Onset Hyperlipidemia (Elevated cholesterol) Mother Diabetes Mother Breast cancer Mother   Social History  Tobacco Use Smoking Status Never Smokeless Tobacco Never   Social History  Socioeconomic History Marital status: Married Tobacco Use Smoking status: Never Smokeless tobacco: Never Vaping Use Vaping Use: Never used Substance and Sexual Activity Alcohol use: Yes Drug use: Never  Objective:  Vitals: 12/22/22 1426 12/22/22 1432 BP: 132/80 Pulse: 68 Temp: 37.6 C (99.6 F) SpO2: 98% Weight: (!) 109.8 kg (242 lb) Height: 182.9 cm (6') PainSc: 0-No pain  Body mass index is 32.82 kg/m.  Physical Exam Constitutional: General: He is not in acute distress. Appearance: Normal appearance. HENT: Head: Normocephalic. Nose: No rhinorrhea. Mouth/Throat: Mouth: Mucous membranes are moist. Pharynx: Oropharynx is clear. Eyes: General: No scleral icterus. Pupils: Pupils are equal, round, and reactive to light. Cardiovascular: Rate and Rhythm: Normal rate. Pulses: Normal pulses. Pulmonary: Effort: Pulmonary effort is normal. No respiratory distress. Breath sounds: No stridor. No wheezing. Abdominal: General: Abdomen is flat. There is no distension. Tenderness: There is no abdominal tenderness. There is no guarding or rebound. Musculoskeletal: General: Normal range of  motion. Cervical back: Normal range of motion and neck supple. Skin: General: Skin is warm   and dry. Capillary Refill: Capillary refill takes less than 2 seconds. Coloration: Skin is not jaundiced. Neurological: General: No focal deficit present. Mental Status: He is alert and oriented to person, place, and time. Mental status is at baseline. Psychiatric: Mood and Affect: Mood normal. Thought Content: Thought content normal. Judgment: Judgment normal.    Assessment and Plan: Diagnoses and all orders for this visit:  Biliary dyskinesia   David Boyd is a 37 y.o. male  We will proceed to the OR for a lap cholecystectomy. All risks and benefits were discussed with the patient to generally include: infection, bleeding, possible need for post op ERCP, damage to the bile ducts, and bile leak. Alternatives were offered and described. All questions were answered and the patient voiced understanding of the procedure and wishes to proceed at this point with a laparoscopic cholecystectomy  No follow-ups on file.  Sky Primo, MD, FACS [facility]  

## 2023-08-28 ENCOUNTER — Other Ambulatory Visit (HOSPITAL_COMMUNITY): Payer: Self-pay

## 2023-08-31 ENCOUNTER — Other Ambulatory Visit (HOSPITAL_BASED_OUTPATIENT_CLINIC_OR_DEPARTMENT_OTHER): Payer: Self-pay

## 2023-08-31 ENCOUNTER — Other Ambulatory Visit (HOSPITAL_COMMUNITY): Payer: Self-pay

## 2023-08-31 MED ORDER — SEMAGLUTIDE-WEIGHT MANAGEMENT 0.25 MG/0.5ML ~~LOC~~ SOAJ
0.2500 mg | SUBCUTANEOUS | 0 refills | Status: DC
  Filled 2023-08-31: qty 0.5, 28d supply, fill #0
  Filled 2023-08-31: qty 2, 28d supply, fill #0

## 2023-09-02 ENCOUNTER — Other Ambulatory Visit (HOSPITAL_COMMUNITY): Payer: Self-pay

## 2023-09-10 ENCOUNTER — Ambulatory Visit
Admission: EM | Admit: 2023-09-10 | Discharge: 2023-09-10 | Disposition: A | Discharge status: Home / Self Care | Payer: Commercial Managed Care - PPO | Attending: Nurse Practitioner | Admitting: Nurse Practitioner

## 2023-09-10 DIAGNOSIS — S51811A Laceration without foreign body of right forearm, initial encounter: Secondary | ICD-10-CM | POA: Diagnosis not present

## 2023-09-10 DIAGNOSIS — S61011A Laceration without foreign body of right thumb without damage to nail, initial encounter: Secondary | ICD-10-CM

## 2023-09-10 DIAGNOSIS — S61212A Laceration without foreign body of right middle finger without damage to nail, initial encounter: Secondary | ICD-10-CM | POA: Diagnosis not present

## 2023-09-10 MED ORDER — TETANUS-DIPHTH-ACELL PERTUSSIS 5-2.5-18.5 LF-MCG/0.5 IM SUSY: 0.5000 mL | PREFILLED_SYRINGE | Freq: Once | INTRAMUSCULAR | Status: DC

## 2023-09-10 NOTE — ED Provider Notes (Signed)
[facility]    CSN: [id number] Arrival date & time: 09/10/23  1433      History   Chief Complaint No chief complaint on file.   HPI David Boyd is a 37 y.o. male.   Patient presents today with lacerations and multiple small cuts and abrasions to right hand and forearm that he sustained just prior to arrival.  Reports he was installing glass into a cabinet when he pushed a little too hard and his hand went through the glass.  He denies decreased range of motion to the forearm, hand, wrist, or fingers.  No numbness or tingling in the digits.  Last Tdap 2023.    Past Medical History:  Diagnosis Date   GERD (gastroesophageal reflux disease)     Patient Active Problem List   Diagnosis Date Noted   Umbilical hernia without obstruction and without gangrene    Constipation 05/11/2014   Rectal pain 05/11/2014    Past Surgical History:  Procedure Laterality Date   CHOLECYSTECTOMY N/A 01/15/2023   Procedure: LAPAROSCOPIC CHOLECYSTECTOMY;  Surgeon: Axel Filler, MD;  Location: [facility];  Service: General;  Laterality: N/A;   COLONOSCOPY N/A 06/01/2014   Procedure: COLONOSCOPY;  Surgeon: Malissa Hippo, MD;  [facility];  Service: Endoscopy;  Laterality: N/A;  730   PILONIDAL CYST EXCISION     [facility]- Dr Malvin Johns   UMBILICAL HERNIA REPAIR N/A 05/21/2020   Procedure: HERNIA REPAIR UMBILICAL ADULT;  Surgeon: Franky Macho, MD;  Location: [facility];  Service: General;  Laterality: N/A;       Home Medications    Prior to Admission medications   Medication Sig Start Date End Date Taking? Authorizing Provider  CREATINE PO Take 1 tablet by mouth 2 (two) times daily as needed (gym days).    [provider]  Cyanocobalamin (B-12 PO) Take 1 tablet by mouth daily.    [provider]  ibuprofen (ADVIL) 800 MG tablet Take 1 tablet (800 mg total) by mouth every 8 (eight) hours as needed. 03/03/22   Vickki Hearing, MD  Multiple Vitamin  (MULTIVITAMINS PO) Take 1 tablet by mouth daily.    [provider]  Omega-3 Fatty Acids (FISH OIL PO) Take 1 capsule by mouth daily.    [provider]  Semaglutide-Weight Management 0.25 MG/0.5ML SOAJ Inject 0.25 mg into the skin once a week. 08/27/23   Royann Shivers, PA-C  VITAMIN D PO Take 1 tablet by mouth daily.    [provider]    Family History No family history on file.  Social History Social History   Tobacco Use   Smoking status: Never   Smokeless tobacco: Never  Vaping Use   Vaping status: Never Used  Substance Use Topics   Alcohol use: Yes    Comment: occasional   Drug use: No     Allergies   Patient has no known allergies.   Review of Systems Review of Systems Per HPI  Physical Exam Triage Vital Signs ED Triage Vitals [09/10/23 1439]  Encounter Vitals Group     BP      Systolic BP Percentile      Diastolic BP Percentile      Pulse      Resp      Temp      Temp src      SpO2      Weight      Height      Head Circumference  Peak Flow      Pain Score 0     Pain Loc      Pain Education      Exclude from Growth Chart    No data found.  Updated Vital Signs There were no vitals taken for this visit.  Visual Acuity Right Eye Distance:   Left Eye Distance:   Bilateral Distance:    Right Eye Near:   Left Eye Near:    Bilateral Near:     Physical Exam Vitals and nursing note reviewed.  Constitutional:      General: He is not in acute distress.    Appearance: Normal appearance. He is not toxic-appearing.  HENT:     Head: Normocephalic and atraumatic.     Mouth/Throat:     Mouth: Mucous membranes are moist.  Pulmonary:     Effort: Pulmonary effort is normal. No respiratory distress.  Skin:    General: Skin is warm and dry.     Capillary Refill: Capillary refill takes less than 2 seconds.     Coloration: Skin is not jaundiced or pale.     Findings: Laceration present.     Comments: Multiple  superficial abrasions noted to dorsal right hand, anterior right forearm.  There is a straight laceration approximately 1 cm in length to the right third digit dorsally over the proximal phalange medially.  There is a jagged laceration to the dorsal thumb over the interphalangeal joint.  Patient has full range of motion of all 5 digits in the hand and wrist.  Has full sensation and is neurovascularly intact distally in all 5 digits.  Neurological:     Mental Status: He is alert and oriented to person, place, and time.  Psychiatric:        Behavior: Behavior is cooperative.      UC Treatments / Results  Labs (all labs ordered are listed, but only abnormal results are displayed) Labs Reviewed - No data to display  EKG   Radiology No results found.  Procedures Laceration Repair  Date/Time: 09/10/2023 3:35 PM  Performed by: Valentino Nose, NP Authorized by: Valentino Nose, NP   Consent:    Consent obtained:  Verbal   Consent given by:  Patient   Risks, benefits, and alternatives were discussed: yes     Risks discussed:  Infection, retained foreign body and vascular damage Universal protocol:    Procedure explained and questions answered to patient or proxy's satisfaction: yes     Patient identity confirmed:  Verbally with patient Anesthesia:    Anesthesia method:  None Laceration details:    Location:  Finger   Finger location:  R long finger   Length (cm):  1.5 Exploration:    Hemostasis achieved with:  Direct pressure   Imaging outcome: foreign body not noted     Wound exploration: wound explored through full range of motion and entire depth of wound visualized     Contaminated: no   Treatment:    Area cleansed with:  Chlorhexidine and saline   Amount of cleaning:  Standard Skin repair:    Repair method:  Tissue adhesive Approximation:    Approximation:  Loose Post-procedure details:    Dressing:  Splint for protection   Procedure completion:  Tolerated  well, no immediate complications  (including critical care time)  Medications Ordered in UC Medications - No data to display  Initial Impression / Assessment and Plan / UC Course  I have reviewed the triage vital signs  and the nursing notes.  Pertinent labs & imaging results that were available during my care of the patient were reviewed by me and considered in my medical decision making (see chart for details).   Patient is well-appearing and is in no acute distress.  All wounds were cleaned and flushed with chlorhexidine and saline solution prior to any intervention.  1. Laceration of right middle finger without foreign body without damage to nail, initial encounter Closed with Dermabond as above, splint provided for protection  2. Laceration of right thumb without foreign body without damage to nail, initial encounter Given jagged nature of this laceration, repair was not performed I removed a piece of glass from another small laceration on the thumb Recommended Vaseline or Aquaphor ointment, covering with nonadherent gauze and bandage until it heals, then leave open to air  3. Laceration of right forearm, initial encounter Superficial lacerations noted; wound care discussed and return for signs or symptoms of infection  The patient was given the opportunity to ask questions.  All questions answered to their satisfaction.  The patient is in agreement to this plan.    Final Clinical Impressions(s) / UC Diagnoses   Final diagnoses:  Laceration of right middle finger without foreign body without damage to nail, initial encounter  Laceration of right thumb without foreign body without damage to nail, initial encounter  Laceration of right forearm, initial encounter     Discharge Instructions      We put skin glue on the cut on your middle finger today.  This will fall off on its own in 1-2 weeks.  You can wear the finger splint to keep the skin glue protected and prevent it from  opening back up.   Keep all of your other cuts clean and dry.  Recommend keeping the cut on your thumb covered with Vaseline or Aquaphor ointment and nonadherent gauze until it scabs over/heals then you can leave open to air.    Return to be reevaluated if the cuts start oozing or draining pus or become red, hot.      ED Prescriptions   None    PDMP not reviewed this encounter.   Valentino Nose, NP 09/10/23 1536

## 2023-09-10 NOTE — Discharge Instructions (Signed)
We put skin glue on the cut on your middle finger today.  This will fall off on its own in 1-2 weeks.  You can wear the finger splint to keep the skin glue protected and prevent it from opening back up.   Keep all of your other cuts clean and dry.  Recommend keeping the cut on your thumb covered with Vaseline or Aquaphor ointment and nonadherent gauze until it scabs over/heals then you can leave open to air.    Return to be reevaluated if the cuts start oozing or draining pus or become red, hot.

## 2023-09-10 NOTE — ED Triage Notes (Signed)
Right hand went thru glasses today.  Small lacerations to right fingers and cuts to right forearm.  Bleeding controled.  States last tetanus was 3 to 4 years ago.

## 2023-09-15 ENCOUNTER — Other Ambulatory Visit (HOSPITAL_COMMUNITY): Payer: Self-pay

## 2023-09-15 MED ORDER — SEMAGLUTIDE-WEIGHT MANAGEMENT 0.5 MG/0.5ML ~~LOC~~ SOAJ
0.5000 mg | SUBCUTANEOUS | 0 refills | Status: AC
  Filled 2023-09-15: qty 2, 28d supply, fill #0

## 2023-09-15 MED ORDER — WEGOVY 0.25 MG/0.5ML ~~LOC~~ SOAJ
SUBCUTANEOUS | 0 refills | Status: DC
  Filled 2023-09-15: qty 1.25, 30d supply, fill #0

## 2023-09-17 ENCOUNTER — Other Ambulatory Visit: Payer: Self-pay

## 2023-09-17 ENCOUNTER — Other Ambulatory Visit (HOSPITAL_COMMUNITY): Payer: Self-pay

## 2023-09-21 ENCOUNTER — Other Ambulatory Visit (HOSPITAL_COMMUNITY): Payer: Self-pay

## 2023-09-25 ENCOUNTER — Other Ambulatory Visit (HOSPITAL_COMMUNITY): Payer: Self-pay

## 2023-09-25 MED ORDER — WEGOVY 1 MG/0.5ML ~~LOC~~ SOAJ
1.0000 mg | SUBCUTANEOUS | 0 refills | Status: AC
  Filled 2023-09-25: qty 4, 56d supply, fill #0

## 2024-07-01 ENCOUNTER — Encounter: Payer: Self-pay | Admitting: Radiology

## 2024-09-05 ENCOUNTER — Ambulatory Visit (INDEPENDENT_AMBULATORY_CARE_PROVIDER_SITE_OTHER): Admitting: Urology

## 2024-09-05 ENCOUNTER — Encounter: Payer: Self-pay | Admitting: Urology

## 2024-09-05 VITALS — BP 163/84 | HR 90

## 2024-09-05 DIAGNOSIS — R7989 Other specified abnormal findings of blood chemistry: Secondary | ICD-10-CM

## 2024-09-05 NOTE — Patient Instructions (Signed)
 Hypogonadism, Male  Male hypogonadism is a condition of having a level of testosterone  that is lower than normal. Testosterone  is a chemical, or hormone, that is made mainly in the testicles. In boys, testosterone  is responsible for the development of male characteristics during puberty. These include: Making the penis bigger. Growing and building the muscles. Growing facial hair. Deepening the voice. In adult men, testosterone  is responsible for maintaining: An interest in sex and the ability to have sex. Muscle mass. Sperm production. Red blood cell production. Bone strength. Testosterone  also gives men energy and a sense of well-being. Testosterone  normally decreases as men age and the testicles make less testosterone . Testosterone  levels can vary from man to man. Not all men will have signs and symptoms of low testosterone . Weight, alcohol use, medicines, and certain medical conditions can affect a man's testosterone  level. What are the causes? This condition is caused by: A natural decrease in testosterone  that occurs as a man grows older. This is the main cause of this condition. Use of medicines, such as antidepressants, steroids, and opioids. Diseases and conditions that affect the testicles or the making of testosterone . These include: Injury or damage to the testicles from trauma, cancer, cancer treatment, or infection. Diabetes. Sleep apnea. Genetic conditions that men are born with. Disease of the pituitary gland. This gland is in the brain. It produces hormones. Obesity. Metabolic syndrome. This is a group of diseases that affect blood pressure, blood sugar, cholesterol, and belly fat. HIV or AIDS. Alcohol abuse. Kidney failure. Other long-term or chronic diseases. What are the signs or symptoms? Common symptoms of this condition include: Loss of interest in sex (low sex drive). Inability to have or maintain an erection (erectile dysfunction). Feeling tired  (fatigue). Mood changes, like irritability or depression. Loss of muscle and body hair. Infertility. Large breasts. Weight gain (obesity). How is this diagnosed? Your health care provider can diagnose hypogonadism based on: Your signs and symptoms. A physical exam to check your testosterone  levels. This includes blood tests. Testosterone  levels can change throughout the day. Levels are highest in the morning. You may need to have repeat blood tests before getting a diagnosis of hypogonadism. Depending on your medical history and test results, your health care provider may also do other tests to find the cause of low testosterone . How is this treated? This condition is treated with testosterone  replacement therapy. Testosterone  can be given by: Injection or through pellets inserted under the skin. Gels or patches placed on the skin or in the mouth. Testosterone  therapy is not for everyone. It has risks and side effects. Your health care provider will consider your medical history, your risk for prostate cancer, your age, and your symptoms before putting you on testosterone  replacement therapy. Follow these instructions at home: Take over-the-counter and prescription medicines only as told by your health care provider. Eat foods that are high in fiber, such as beans, whole grains, and fresh fruits and vegetables. Limit foods that are high in fat and processed sugars, such as fried or sweet foods. If you drink alcohol: Limit how much you have to 0-2 drinks a day. Know how much alcohol is in your drink. In the [place] one drink equals one 12 oz bottle of beer (355 mL), one 5 oz glass of wine (148 mL), or one 1 oz glass of hard liquor (44 mL). Return to your normal activities as told by your health care provider. Ask your health care provider what activities are safe for you. Keep all  follow-up visits. This is important. Contact a health care provider if: You have any of the signs or symptoms of  low testosterone . You have any side effects from testosterone  therapy. Summary Male hypogonadism is a condition of having a level of testosterone  that is lower than normal. The natural drop in testosterone  production that occurs with age is the most common cause of this condition. Low testosterone  can also be caused by many diseases and conditions that affect the testicles and the making of testosterone . This condition is treated with testosterone  replacement therapy. There are risks and side effects of testosterone  therapy. Your health care provider will consider your age, medical history, symptoms, and risks for prostate cancer before putting you on testosterone  therapy. This information is not intended to replace advice given to you by your health care provider. Make sure you discuss any questions you have with your health care provider. Document Revised: 10/05/2023 Document Reviewed: 10/05/2023 Elsevier Patient Education  2025 [employer].

## 2024-09-05 NOTE — Progress Notes (Signed)
 09/05/2024 10:50 AM   David Boyd 1986-10-07 [id number]  Referring provider: Toribio Jerel MATSU, MD [address] [place],  [place] [address]  fatigue   HPI: David Boyd is a 38yo here for evaluation of low testosterone. For the past 3 months he has noted increased fatigue and decreased libido. He has had a 30lb weight gain in the past 3 months. Testosterone was 540. He has issues getting and maintaining an erection for the past 3 months. He had testopel implanted 4-5 months ago.    PMH: Past Medical History:  Diagnosis Date   GERD (gastroesophageal reflux disease)     Surgical History: Past Surgical History:  Procedure Laterality Date   CHOLECYSTECTOMY N/A 01/15/2023   Procedure: LAPAROSCOPIC CHOLECYSTECTOMY;  Surgeon: Rubin Calamity, MD;  Location: [facility];  Service: General;  Laterality: N/A;   COLONOSCOPY N/A 06/01/2014   Procedure: COLONOSCOPY;  Surgeon: Claudis RAYMOND Rivet, MD;  [facility];  Service: Endoscopy;  Laterality: N/A;  730   PILONIDAL CYST EXCISION     [facility]- Dr Floria   UMBILICAL HERNIA REPAIR N/A 05/21/2020   Procedure: HERNIA REPAIR UMBILICAL ADULT;  Surgeon: Mavis Anes, MD;  Location: [facility];  Service: General;  Laterality: N/A;    Home Medications:  Allergies as of 09/05/2024   No Known Allergies      Medication List        Accurate as of September 05, 2024 10:50 AM. If you have any questions, ask your nurse or doctor.          B-12 PO Take 1 tablet by mouth daily.   CREATINE PO Take 1 tablet by mouth 2 (two) times daily as needed (gym days).   FISH OIL PO Take 1 capsule by mouth daily.   ibuprofen  800 MG tablet Commonly known as: ADVIL  Take 1 tablet (800 mg total) by mouth every 8 (eight) hours as needed.   MULTIVITAMINS PO Take 1 tablet by mouth daily.   VITAMIN D PO Take 1 tablet by mouth daily.   Wegovy  0.5 MG/0.5ML Soaj SQ injection Generic drug: semaglutide -weight management Inject 0.5 mg into the skin once a  week.   Wegovy  1 MG/0.5ML Soaj SQ injection Generic drug: semaglutide -weight management Inject 1 mg into the skin once a week.        Allergies: No Known Allergies  Family History: No family history on file.  Social History:  reports that he has never smoked. He has never used smokeless tobacco. He reports current alcohol use. He reports that he does not use drugs.  ROS: All other review of systems were reviewed and are negative except what is noted above in HPI  Physical Exam: BP (!) 163/84   Pulse 90   Constitutional:  Alert and oriented, No acute distress. HEENT: [place] AT, moist mucus membranes.  Trachea midline, no masses. Cardiovascular: No clubbing, cyanosis, or edema. Respiratory: Normal respiratory effort, no increased work of breathing. GI: Abdomen is soft, nontender, nondistended, no abdominal masses GU: No CVA tenderness.  Lymph: No cervical or inguinal lymphadenopathy. Skin: No rashes, bruises or suspicious lesions. Neurologic: Grossly intact, no focal deficits, moving all 4 extremities. Psychiatric: Normal mood and affect.  Laboratory Data: Lab Results  Component Value Date   WBC 5.2 01/08/2023   HGB 16.3 01/08/2023   HCT 47.6 01/08/2023   MCV 89.8 01/08/2023   PLT 210 01/08/2023    Lab Results  Component Value Date   CREATININE 0.81 01/17/2022    No  results found for: PSA  No results found for: TESTOSTERONE  No results found for: HGBA1C  Urinalysis    Component Value Date/Time   COLORURINE YELLOW 06/01/2011 1538   APPEARANCEUR CLEAR 06/01/2011 1538   LABSPEC >1.030 (H) 06/01/2011 1538   PHURINE 5.5 06/01/2011 1538   GLUCOSEU NEGATIVE 06/01/2011 1538   HGBUR NEGATIVE 06/01/2011 1538   BILIRUBINUR NEGATIVE 06/01/2011 1538   KETONESUR NEGATIVE 06/01/2011 1538   PROTEINUR NEGATIVE 06/01/2011 1538   UROBILINOGEN 0.2 06/01/2011 1538   NITRITE NEGATIVE 06/01/2011 1538   LEUKOCYTESUR NEGATIVE 06/01/2011 1538    No results found for:  LABMICR, WBCUA, RBCUA, LABEPIT, MUCUS, BACTERIA  Pertinent Imaging:  No results found for this or any previous visit.  No results found for this or any previous visit.  No results found for this or any previous visit.  No results found for this or any previous visit.  No results found for this or any previous visit.  No results found for this or any previous visit.  No results found for this or any previous visit.  No results found for this or any previous visit.   Assessment & Plan:    1. Low testosterone (Primary) Testosterone labs, will call with results -followup 3 months with labs   No follow-ups on file.  Belvie Clara, MD  [facility] [place]

## 2024-09-06 LAB — TSH: TSH: 1.33 u[IU]/mL (ref 0.450–4.500)

## 2024-09-06 LAB — TESTOSTERONE,FREE AND TOTAL
Testosterone, Free: 15.7 pg/mL (ref 8.7–25.1)
Testosterone: 559 ng/dL (ref 264–916)

## 2024-09-06 LAB — PROLACTIN: Prolactin: 8.7 ng/mL (ref 3.9–22.7)

## 2024-09-06 LAB — ESTRADIOL: Estradiol: 31.2 pg/mL (ref 7.6–42.6)

## 2024-09-12 ENCOUNTER — Telehealth: Payer: Self-pay

## 2024-09-12 ENCOUNTER — Encounter: Payer: Self-pay | Admitting: Radiology

## 2024-09-12 NOTE — Telephone Encounter (Signed)
 Called wife to let her know that we have forwarded pt results to MD for review pt wife voiced her understanding

## 2024-09-12 NOTE — Telephone Encounter (Addendum)
 Patient returned call requesting lab results.   Test was completed on 12-07-2023  Best number to contact patient [phone] - wife, Harlene

## 2024-09-13 NOTE — Telephone Encounter (Signed)
 Called pt to give him MD recommendations pt stated he is already on B12 pt was advised to take them twice a day per MD McKenzie pt stated he  understands but has more questions and will call back with them at another time pt given office number and encouraged to leave a detail message if we do not answer

## 2024-09-20 ENCOUNTER — Other Ambulatory Visit (HOSPITAL_BASED_OUTPATIENT_CLINIC_OR_DEPARTMENT_OTHER): Payer: Self-pay

## 2024-09-20 MED ORDER — ESCITALOPRAM OXALATE 10 MG PO TABS
10.0000 mg | ORAL_TABLET | Freq: Every day | ORAL | 0 refills | Status: AC
  Filled 2024-09-20: qty 30, 30d supply, fill #0

## 2024-09-20 MED ORDER — PANTOPRAZOLE SODIUM 40 MG PO TBEC
40.0000 mg | DELAYED_RELEASE_TABLET | Freq: Every day | ORAL | 0 refills | Status: AC | PRN
  Filled 2024-09-20: qty 90, 90d supply, fill #0

## 2024-09-27 ENCOUNTER — Telehealth: Payer: Self-pay | Admitting: Urology

## 2024-09-27 DIAGNOSIS — N529 Male erectile dysfunction, unspecified: Secondary | ICD-10-CM

## 2024-09-27 NOTE — Telephone Encounter (Signed)
 Returned phone call to pt wife who stated they received testosterone labs and they were okay but would like a prescription or something in regard to pt ED pt wife was advised that a message would be sent to MD for his advisement

## 2024-09-27 NOTE — Telephone Encounter (Signed)
 Called pt to let him know that MD McKenzie is out of office until tomorrow and we will hopefully have a response by tomorrow

## 2024-09-27 NOTE — Telephone Encounter (Signed)
 Wife called back to check on status of advice for ED.   Please advise.  Call:  [phone] - Can leave a message if no answer.

## 2024-09-27 NOTE — Telephone Encounter (Signed)
 Wife called and said labs were fine but he still has ED and would like to address the issue.

## 2024-09-28 ENCOUNTER — Telehealth: Payer: Self-pay

## 2024-09-28 NOTE — Telephone Encounter (Signed)
 Patients wife called and requested a call back to discuss the continuation of issues he's having.     Harlene Passy Cell Phone [phone]

## 2024-09-29 NOTE — Telephone Encounter (Signed)
 Wife called back wanting a call back about ED with testosterone  levels being normal

## 2024-09-30 MED ORDER — TADALAFIL 20 MG PO TABS: ORAL_TABLET | ORAL | 11 refills | Status: AC

## 2024-09-30 NOTE — Telephone Encounter (Signed)
 Called pt wife to let her know per verbal from MD McKenzie, send in pt tadalafil  20 mg for erectile dysfunction. Pt wife made aware and confirmed pharmacy

## 2024-09-30 NOTE — Addendum Note (Signed)
 Addended by: SAMMIE EXIE HERO on: 09/30/2024 10:48 AM   Modules accepted: Orders

## 2024-10-03 NOTE — Telephone Encounter (Signed)
 See other telephone encounter.

## 2024-12-16 ENCOUNTER — Encounter: Payer: Self-pay | Admitting: Orthopedic Surgery

## 2024-12-16 ENCOUNTER — Ambulatory Visit: Admitting: Orthopedic Surgery

## 2024-12-16 VITALS — BP 155/104 | HR 86 | Ht 71.0 in | Wt 233.0 lb

## 2024-12-16 DIAGNOSIS — S43432S Superior glenoid labrum lesion of left shoulder, sequela: Secondary | ICD-10-CM

## 2024-12-16 DIAGNOSIS — M25512 Pain in left shoulder: Secondary | ICD-10-CM

## 2024-12-16 MED ORDER — METHYLPREDNISOLONE ACETATE 40 MG/ML IJ SUSP
40.0000 mg | Freq: Once | INTRAMUSCULAR | Status: AC
  Administered 2024-12-16: 40 mg via INTRA_ARTICULAR

## 2024-12-16 NOTE — Progress Notes (Signed)
" °  Intake history:  Chief Complaint  Patient presents with   Shoulder Pain    Left for about a week      Ht 5' 11 (1.803 m)   Wt 233 lb (105.7 kg)   BMI 32.50 kg/m  Body mass index is 32.5 kg/m.  Pharmacy?  [facility]   WHAT ARE WE SEEING YOU FOR TODAY?   Left shoulder   How long has this bothered you? (DOI?DOS?WS?)  1 week ago   Was there an injury? No not recent old injury   Anticoag.  No   Any ALLERGIES ______________________________________________   Treatment:  Have you taken:  Tylenol  No  Advil  No  Had PT No  Had injection No  Other  _________________________     "

## 2024-12-16 NOTE — Patient Instructions (Signed)
 Joint Steroid Injection A joint steroid injection is a procedure to relieve swelling and pain in a joint. Steroids are medicines that reduce inflammation. In this procedure, your health care provider uses a syringe and a needle to inject a steroid medicine into a painful and inflamed joint. A pain-relieving medicine (anesthetic) may be injected along with the steroid. In some cases, your health care provider may use an imaging technique such as ultrasound or fluoroscopy to guide the injection. Joints that are often treated with steroid injections include the knee, shoulder, hip, and spine. These injections may also be used in the elbow, ankle, and joints of the hands or feet. You may have joint steroid injections as part of your treatment for inflammation caused by: Gout. Rheumatoid arthritis. Advanced wear-and-tear arthritis (osteoarthritis). Tendinitis. Bursitis. Joint steroid injections may be repeated, but having them too often can damage a joint or the skin over the joint. You should not have joint steroid injections less than 6 weeks apart or more than four times a year. Tell a health care provider about: Any allergies you have. All medicines you are taking, including vitamins, herbs, eye drops, creams, and over-the-counter medicines. Any problems you or family members have had with anesthetic medicines. Any blood disorders you have. Any surgeries you have had. Any medical conditions you have. Whether you are pregnant or may be pregnant. What are the risks? Generally, this is a safe treatment. However, problems may occur, including: Infection. Bleeding. Allergic reactions to medicines. Damage to the joint or tissues around the joint. Thinning of skin or loss of skin color over the joint. Temporary flushing of the face or chest. Temporary increase in pain. Temporary increase in blood sugar. Failure to relieve inflammation or pain. What happens before the treatment? Medicines Ask  your health care provider about: Changing or stopping your regular medicines. This is especially important if you are taking diabetes medicines or blood thinners. Taking medicines such as aspirin and ibuprofen. These medicines can thin your blood. Do not take these medicines unless your health care provider tells you to take them. Taking over-the-counter medicines, vitamins, herbs, and supplements. General instructions You may have imaging tests of your joint. Ask your health care provider if you can drive yourself home after the procedure. What happens during the treatment?  Your health care provider will position you for the injection and locate the injection site over your joint. The skin over the joint will be cleaned with a germ-killing soap. Your health care provider may: Spray a numbing solution (topical anesthetic) over the injection site. Inject a local anesthetic under the skin above your joint. The needle will be placed through your skin into your joint. Your health care provider may use imaging to guide the needle to the right spot for the injection. If imaging is used, a special contrast dye may be injected to confirm that the needle is in the correct location. The steroid medicine will be injected into your joint. Anesthetic may be injected along with the steroid. This may be a medicine that relieves pain for a short time (short-acting anesthetic) or for a longer time (long-acting anesthetic). The needle will be removed, and an adhesive bandage (dressing) will be placed over the injection site. The procedure may vary among health care providers and hospitals. What can I expect after the treatment? You will be able to go home after the treatment. It is normal to feel slight flushing for a few days after the injection. After the treatment, it is  common to have an increase in joint pain after the anesthetic has worn off. This may happen about an hour after a short-acting anesthetic  or about 8 hours after a longer-acting anesthetic. You should begin to feel relief from joint pain and swelling after 24 to 48 hours. Contact your health care provider if you do not begin to feel relief after 2 days. Follow these instructions at home: Injection site care Leave the adhesive dressing over your injection site in place until your health care provider says you can remove it. Check your injection site every day for signs of infection. Check for: More redness, swelling, or pain. Fluid or blood. Warmth. Pus or a bad smell. Activity Return to your normal activities as told by your health care provider. Ask your health care provider what activities are safe for you. You may be asked to limit activities that put stress on the joint for a few days. Do joint exercises as told by your health care provider. Do not take baths, swim, or use a hot tub until your health care provider approves. Ask your health care provider if you may take showers. You may only be allowed to take sponge baths. Managing pain, stiffness, and swelling  If directed, put ice on the joint. To do this: Put ice in a plastic bag. Place a towel between your skin and the bag. Leave the ice on for 20 minutes, 2-3 times a day. Remove the ice if your skin turns bright red. This is very important. If you cannot feel pain, heat, or cold, you have a greater risk of damage to the area. Raise (elevate) your joint above the level of your heart when you are sitting or lying down. General instructions Take over-the-counter and prescription medicines only as told by your health care provider. Do not use any products that contain nicotine or tobacco, such as cigarettes, e-cigarettes, and chewing tobacco. These can delay joint healing. If you need help quitting, ask your health care provider. If you have diabetes, be aware that your blood sugar may be slightly elevated for several days after the injection. Keep all follow-up visits.  This is important. Contact a health care provider if you have: Chills or a fever. Any signs of infection at your injection site. Increased pain or swelling or no relief after 2 days. Summary A joint steroid injection is a treatment to relieve pain and swelling in a joint. Steroids are medicines that reduce inflammation. Your health care provider may add an anesthetic along with the steroid. You may have joint steroid injections as part of your arthritis treatment. Joint steroid injections may be repeated, but having them too often can damage a joint or the skin over the joint. Contact your health care provider if you have a fever, chills, or signs of infection, or if you get no relief from joint pain or swelling. This information is not intended to replace advice given to you by your health care provider. Make sure you discuss any questions you have with your health care provider. Document Revised: 04/06/2020 Document Reviewed: 04/06/2020 Elsevier Patient Education  2024 [employer].
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
## 2024-12-19 ENCOUNTER — Other Ambulatory Visit

## 2025-01-02 ENCOUNTER — Ambulatory Visit: Admitting: Urology
